# Patient Record
Sex: Female | Born: 1940
Health system: Southern US, Community
[De-identification: ages and names within clinical notes are randomized; demographics above are authoritative.]

## PROBLEM LIST (undated history)

## (undated) DIAGNOSIS — M1990 Unspecified osteoarthritis, unspecified site: Secondary | ICD-10-CM

## (undated) DIAGNOSIS — E7800 Pure hypercholesterolemia, unspecified: Secondary | ICD-10-CM

## (undated) DIAGNOSIS — R197 Diarrhea, unspecified: Secondary | ICD-10-CM

## (undated) DIAGNOSIS — M8580 Other specified disorders of bone density and structure, unspecified site: Secondary | ICD-10-CM

## (undated) DIAGNOSIS — I639 Cerebral infarction, unspecified: Secondary | ICD-10-CM

## (undated) HISTORY — PX: ABDOMINAL HYSTERECTOMY: SHX81

## (undated) HISTORY — PX: HERNIA REPAIR: SHX51

## (undated) HISTORY — PX: COLONOSCOPY: SHX174

## (undated) HISTORY — PX: CATARACT EXTRACTION: SUR2

---

## 2003-03-27 ENCOUNTER — Ambulatory Visit (HOSPITAL_COMMUNITY): Admission: RE | Admit: 2003-03-27 | Discharge: 2003-03-27 | Payer: Self-pay | Admitting: Family Medicine

## 2003-03-27 ENCOUNTER — Encounter: Payer: Self-pay | Admitting: Family Medicine

## 2003-05-14 ENCOUNTER — Ambulatory Visit (HOSPITAL_COMMUNITY): Admission: RE | Admit: 2003-05-14 | Discharge: 2003-05-14 | Payer: Self-pay | Admitting: Internal Medicine

## 2003-05-28 ENCOUNTER — Inpatient Hospital Stay (HOSPITAL_COMMUNITY): Admission: RE | Admit: 2003-05-28 | Discharge: 2003-05-30 | Payer: Self-pay | Admitting: Obstetrics & Gynecology

## 2005-11-09 ENCOUNTER — Ambulatory Visit (HOSPITAL_COMMUNITY): Admission: RE | Admit: 2005-11-09 | Discharge: 2005-11-09 | Payer: Self-pay | Admitting: Family Medicine

## 2005-11-10 ENCOUNTER — Emergency Department (HOSPITAL_COMMUNITY): Admission: EM | Admit: 2005-11-10 | Discharge: 2005-11-10 | Payer: Self-pay | Admitting: Emergency Medicine

## 2007-05-15 ENCOUNTER — Ambulatory Visit (HOSPITAL_COMMUNITY): Admission: RE | Admit: 2007-05-15 | Discharge: 2007-05-15 | Payer: Self-pay | Admitting: Family Medicine

## 2007-09-26 ENCOUNTER — Ambulatory Visit (HOSPITAL_COMMUNITY): Admission: RE | Admit: 2007-09-26 | Discharge: 2007-09-26 | Payer: Self-pay | Admitting: Family Medicine

## 2007-10-10 ENCOUNTER — Ambulatory Visit (HOSPITAL_COMMUNITY): Admission: RE | Admit: 2007-10-10 | Discharge: 2007-10-10 | Payer: Self-pay | Admitting: Family Medicine

## 2008-05-23 ENCOUNTER — Ambulatory Visit (HOSPITAL_COMMUNITY): Admission: RE | Admit: 2008-05-23 | Discharge: 2008-05-23 | Payer: Self-pay | Admitting: Family Medicine

## 2008-10-09 ENCOUNTER — Ambulatory Visit (HOSPITAL_COMMUNITY): Admission: RE | Admit: 2008-10-09 | Discharge: 2008-10-09 | Payer: Self-pay | Admitting: Family Medicine

## 2010-06-26 ENCOUNTER — Encounter: Admission: RE | Admit: 2010-06-26 | Discharge: 2010-06-26 | Payer: Self-pay | Admitting: General Surgery

## 2010-06-30 ENCOUNTER — Ambulatory Visit (HOSPITAL_BASED_OUTPATIENT_CLINIC_OR_DEPARTMENT_OTHER): Admission: RE | Admit: 2010-06-30 | Discharge: 2010-06-30 | Payer: Self-pay | Admitting: General Surgery

## 2010-08-17 ENCOUNTER — Ambulatory Visit (HOSPITAL_COMMUNITY)
Admission: RE | Admit: 2010-08-17 | Discharge: 2010-08-17 | Payer: Self-pay | Source: Home / Self Care | Admitting: Family Medicine

## 2010-12-03 LAB — BASIC METABOLIC PANEL
BUN: 10 mg/dL (ref 6–23)
CO2: 29 mEq/L (ref 19–32)
Calcium: 9.5 mg/dL (ref 8.4–10.5)
Chloride: 103 mEq/L (ref 96–112)
Creatinine, Ser: 0.81 mg/dL (ref 0.4–1.2)
GFR calc Af Amer: >60 mL/min (ref >60)
GFR calc non Af Amer: >60 mL/min (ref >60)
Glucose, Bld: 96 mg/dL (ref 70–99)
Potassium: 4.2 mEq/L (ref 3.5–5.1)
Sodium: 138 mEq/L (ref 135–145)

## 2010-12-03 LAB — CBC
HCT: 40.3 % (ref 36.0–46.0)
Hemoglobin: 13.6 g/dL (ref 12.0–15.0)
MCH: 30.8 pg (ref 26.0–34.0)
MCHC: 33.7 g/dL (ref 30.0–36.0)
MCV: 91.2 fL (ref 78.0–100.0)
Platelets: 223 10*3/uL (ref 150–400)
RBC: 4.42 MIL/uL (ref 3.87–5.11)
RDW: 13.4 % (ref 11.5–15.5)
WBC: 7 10*3/uL (ref 4.0–10.5)

## 2010-12-03 LAB — DIFFERENTIAL
Basophils Absolute: 0 10*3/uL (ref 0.0–0.1)
Basophils Relative: 0 % (ref 0–1)
Eosinophils Absolute: 0.1 10*3/uL (ref 0.0–0.7)
Eosinophils Relative: 1 % (ref 0–5)
Lymphocytes Relative: 26 % (ref 12–46)
Lymphs Abs: 1.8 10*3/uL (ref 0.7–4.0)
Monocytes Absolute: 0.5 10*3/uL (ref 0.1–1.0)
Monocytes Relative: 7 % (ref 3–12)
Neutro Abs: 4.6 10*3/uL (ref 1.7–7.7)
Neutrophils Relative %: 65 % (ref 43–77)

## 2010-12-03 LAB — POCT HEMOGLOBIN-HEMACUE: Hemoglobin: 13.2 g/dL (ref 12.0–15.0)

## 2010-12-08 ENCOUNTER — Other Ambulatory Visit (HOSPITAL_COMMUNITY): Payer: Self-pay | Admitting: Family Medicine

## 2010-12-08 DIAGNOSIS — M8580 Other specified disorders of bone density and structure, unspecified site: Secondary | ICD-10-CM

## 2010-12-14 ENCOUNTER — Ambulatory Visit (HOSPITAL_COMMUNITY)
Admission: RE | Admit: 2010-12-14 | Discharge: 2010-12-14 | Disposition: A | Discharge status: Home / Self Care | Payer: Medicare Other | Source: Ambulatory Visit | Attending: Family Medicine | Admitting: Family Medicine

## 2010-12-14 DIAGNOSIS — Z780 Asymptomatic menopausal state: Secondary | ICD-10-CM | POA: Insufficient documentation

## 2010-12-14 DIAGNOSIS — M8580 Other specified disorders of bone density and structure, unspecified site: Secondary | ICD-10-CM

## 2010-12-14 DIAGNOSIS — M899 Disorder of bone, unspecified: Secondary | ICD-10-CM | POA: Insufficient documentation

## 2010-12-14 DIAGNOSIS — M949 Disorder of cartilage, unspecified: Secondary | ICD-10-CM | POA: Insufficient documentation

## 2011-02-05 NOTE — Op Note (Signed)
NAME:  Christy Fletcher, Christy Fletcher                        ACCOUNT NO.:  [id number]   MEDICAL RECORD NO.:  [id number]                   PATIENT TYPE:  AMB   LOCATION:  DAY                                  FACILITY:  [facility]   PHYSICIAN:  Lazaro Arms, M.D.                DATE OF BIRTH:  [birth year]   DATE OF PROCEDURE:  05/28/2003  DATE OF DISCHARGE:                                 OPERATIVE REPORT   PREOPERATIVE DIAGNOSIS:  Symptomatic pelvic prolapse.   POSTOPERATIVE DIAGNOSIS:  Symptomatic pelvic prolapse.   PROCEDURE:  1. Total vaginal hysterectomy with bilateral salpingo-oophorectomy.  2. Anterior colporrhaphy using Pelvicol graft.  3. Vaginal vault suspension, interspinous.   SURGEON:  Lazaro Arms, M.D.   ANESTHESIA:  General endotracheal.   FINDINGS:  The patient was known to have a grade 3 uterine and grade 3  cystocele prolapse.  She also had prolapse of the vaginal apex.  She had  been becoming increasingly symptomatic since earlier this spring.  This was  confirmed at the time of surgery.  There were no other abnormalities seen.  The uterus, tubes, and ovaries were otherwise normal.   DESCRIPTION OF PROCEDURE:  The patient was taken to the operating room and  placed in the supine position where she underwent general endotracheal  anesthesia.  She was then placed in the dorsal lithotomy position and  prepped and draped in the usual sterile fashion.   A weighted speculum was placed.  The cervix was grasped and injected with  0.5% Marcaine with 1:200,000 epinephrine.  An incision was made about the  cervix with the electrocautery unit.  Because of the significant prolapse,  great care had to be taken in order to not injure the bladder and the  rectum.  She had, of course, concern with the significant cystocele, the  ureters coming down very low and looping back up, so great care was taken to  avoid injury of these structures.  The posterior cul-de-sac was then  entered.  The  uterosacral ligaments were clamped, cut, transfixion suture  ligated, and held.  The anterior peritoneum was then entered.  The anterior  and posterior leaves of the broad ligament were plicated, and the uterine  vessels were clamped, cut, and suture ligated.  Serial pedicles were taken  up the fundus, each pedicle being clamped, cut, and suture ligated.  The  utero-ovarian ligaments were crossclamped.  The uterus was removed.  Suture  ligation was performed.  The infundibulopelvic ligaments were then  crossclamped and double suture ligated, with good hemostasis.  The  peritoneum was then closed in a pursestring fashion, and the corners of the  vagina were closed anteriorly to posteriorly.  Again, there was good  hemostasis.  Marcaine 0.5% was then injected in the vesicovaginal space, and  the vagina was dissected off of the bladder without difficulty.  The  dissection was taken laterally to the  ischial spines and the sacrospinous  ligament bilaterally.  The Pelvicol graft was trimmed to size and then  anchored using the Cappio needle retriever system and an 0 Vicryl hub.  The  Pelvicol graft was then anchored to each side to the sacrospinous ligament.  The vaginal apex was then anchored to this sacrospinous fixation of the  Pelvicol graft bilaterally.  The superior portion of the graft was then  anchored to the pubocervical fascia.  Extra vaginal mucosa was trimmed away,  and the vagina was closed in the usual fashion.  The Pelvicol graft was  sitting nicely on the anterior vaginal wall, with good support.  There was  good hemostasis.  No packing was used.   The patient was awakened from anesthesia and taken to the recovery room in  good and stable condition.  All counts were correct.  She received a gram of  Ancef prophylactically, and all specimens were sent to the lab.                                               Lazaro Arms, M.D.    [name]  D:  05/28/2003  T:  05/28/2003   Job:  [id number]

## 2011-02-05 NOTE — Op Note (Signed)
   NAME:  ARAIYAH, CUMPTON                        ACCOUNT NO.:  [id number]   MEDICAL RECORD NO.:  [id number]                   PATIENT TYPE:  AMB   LOCATION:  DAY                                  FACILITY:  [facility]   PHYSICIAN:  Lionel December, M.D.                 DATE OF BIRTH:  11/27/1940   DATE OF PROCEDURE:  DATE OF DISCHARGE:                                 OPERATIVE REPORT   No dictation for this job.                                               Lionel December, M.D.    NR/MEDQ  D:  05/14/2003  T:  05/14/2003  Job:  [id number]

## 2011-02-05 NOTE — H&P (Signed)
NAME:  Christy Fletcher, Christy Fletcher                        ACCOUNT NO.:  [id number]   MEDICAL RECORD NO.:  [id number]                   PATIENT TYPE:  AMB   LOCATION:  DAY                                  FACILITY:  [facility]   PHYSICIAN:  Lazaro Arms, M.D.                DATE OF BIRTH:  Feb 16, 1941   DATE OF ADMISSION:  DATE OF DISCHARGE:                                HISTORY & PHYSICAL   HISTORY OF PRESENT ILLNESS:  Christy Fletcher is a 70 year old female, gravida 2,  para 2 who has been menopausal for approximately 20 years, who had an acute  change in pelvic pressure.  I saw her initially in June 2004.  At that time  she was found to have Grade 3 uterine prolapse, Grade 3 cystocele, no  rectocele and no demonstrable enterocele, and she denied any incontinence.  We talked about various options with the patient including conservative  management with Estrace, etc, and she has decided to have definitive  surgical management of TAH/BSO and anterior colporrhaphy.  No urodynamic  testing was performed because the patient was not incontinent; and, as a  result no sling will be placed.   PAST MEDICAL HISTORY:  Negative.   PAST SURGICAL HISTORY:  Negative.   PAST OBSTETRICAL HISTORY:  Two vaginal deliveries.   ALLERGIES:  None.   MEDICATIONS:  None.   REVIEW OF SYSTEMS:  Review of systems otherwise negative.   FAMILY HISTORY:  Family history significant for heart disease, hypertension,  cancer, diabetes, and thyroid disease.   SOCIAL HISTORY:  The patient does no smoke, drink or do drugs.  She works at  the [employer] in [place].  Denies any history of domestic abuse.   PHYSICAL EXAMINATION:  VITAL SIGNS:  Weight 143 pounds.  Blood pressure  120/80.  HEENT:  Unremarkable.  NECK:  Thyroid is normal.  LUNGS:  Clear.  HEART:  The heart shows a regular rate and rhythm without murmurs, rubs or  gallops.  BREASTS:  Deferred.  ABDOMEN:  Benign.  PELVIC:  Pelvic reveals a Grade 3 uterine and Grade 3  bladder prolapse.  She  has no rectocele and no enterocele.  EXTREMITIES:  Warm.  No edema.  NEUROLOGIC EXAMINATION:  Grossly intact.    IMPRESSION:  1. Symptomatic pelvic prolapse.   PLAN:  The patient is admitted for TAH/BSO, anterior colporrhaphy and  vaginal vault apex fixation.   The patient understands the risks of surgery including infection, bleeding  and damage to other organs.  She understands the graph.  She was given hand  outs of the graph and will proceed.                                                 Lazaro Arms, M.D.  LHE/MEDQ  D:  05/27/2003  T:  05/27/2003  Job:  [id number]

## 2011-02-05 NOTE — Op Note (Signed)
   NAME:  Christy Fletcher, Christy Fletcher                        ACCOUNT NO.:  [id number]   MEDICAL RECORD NO.:  [id number]                   PATIENT TYPE:  AMB   LOCATION:  DAY                                  FACILITY:  [facility]   PHYSICIAN:  Lionel December, M.D.                 DATE OF BIRTH:  1941-08-20   DATE OF PROCEDURE:  05/14/2003  DATE OF DISCHARGE:                                 OPERATIVE REPORT   PROCEDURE:  Total colonoscopy.   INDICATIONS:  [name] is a 70 year old Caucasian female who is undergoing  screening colonoscopy.  Family history is negative for colorectal carcinoma.  The procedure risks were reviewed with the patient.  Informed consent was  obtained.   PREMEDICATION:  Demerol 25 mg IV, Versed 4 mg IV.   FINDINGS:  Procedure performed in endoscopy suite.  The patient's vital  signs and O2 saturation were monitored during procedure and remained stable.  The patient was placed in the left lateral recumbent position and rectal  examination performed.  No abnormality noted on external or digital exam.  The scope was placed in the rectum and advanced under vision into the  sigmoid colon and beyond.  Preparation was excellent.  The scope was passed  into the cecum, which was identified by ileocecal valve and appendiceal  orifice.  Pictures taken for the record.  As the scope was withdrawn, the  colonic mucosa was once again carefully examined.  There were no polyps or  other abnormalities.  The rectal mucosa was normal.  The scope was  retroflexed to examine the anorectal junction, and moderate-sized  hemorrhoids were noted below the dentate line.  The endoscope was  straightened and withdrawn.  The patient tolerated the procedure well.   FINAL DIAGNOSIS:  Moderate-sized external hemorrhoids, otherwise normal  colonoscopy.   RECOMMENDATIONS:  She should continue yearly Hemoccults and consider having  a next screening exam in 10 years from now.                 Lionel December, M.D.    NR/MEDQ  D:  05/14/2003  T:  05/14/2003  Job:  [id number]   cc:   Mila Homer. Sudie Bailey, M.D.  [address] [place], [place] [address]  Fax: [phone]

## 2011-08-31 ENCOUNTER — Other Ambulatory Visit (HOSPITAL_COMMUNITY): Payer: Self-pay | Admitting: Family Medicine

## 2011-08-31 DIAGNOSIS — Z139 Encounter for screening, unspecified: Secondary | ICD-10-CM

## 2011-09-06 ENCOUNTER — Ambulatory Visit (HOSPITAL_COMMUNITY)
Admission: RE | Admit: 2011-09-06 | Discharge: 2011-09-06 | Disposition: A | Discharge status: Home / Self Care | Payer: Medicare Other | Source: Ambulatory Visit | Attending: Family Medicine | Admitting: Family Medicine

## 2011-09-06 DIAGNOSIS — Z1231 Encounter for screening mammogram for malignant neoplasm of breast: Secondary | ICD-10-CM | POA: Insufficient documentation

## 2011-09-06 DIAGNOSIS — Z139 Encounter for screening, unspecified: Secondary | ICD-10-CM

## 2012-06-26 ENCOUNTER — Other Ambulatory Visit (HOSPITAL_COMMUNITY): Payer: Self-pay | Admitting: Family Medicine

## 2012-06-26 DIAGNOSIS — M1990 Unspecified osteoarthritis, unspecified site: Secondary | ICD-10-CM

## 2012-06-30 ENCOUNTER — Other Ambulatory Visit (HOSPITAL_COMMUNITY): Payer: Medicare Other

## 2012-08-02 ENCOUNTER — Other Ambulatory Visit (HOSPITAL_COMMUNITY): Payer: Self-pay | Admitting: Family Medicine

## 2012-08-02 DIAGNOSIS — Z139 Encounter for screening, unspecified: Secondary | ICD-10-CM

## 2012-09-07 ENCOUNTER — Ambulatory Visit (HOSPITAL_COMMUNITY): Payer: Medicare Other

## 2012-09-14 ENCOUNTER — Ambulatory Visit (HOSPITAL_COMMUNITY)
Admission: RE | Admit: 2012-09-14 | Discharge: 2012-09-14 | Disposition: A | Discharge status: Home / Self Care | Payer: Medicare Other | Source: Ambulatory Visit | Attending: Family Medicine | Admitting: Family Medicine

## 2012-09-14 DIAGNOSIS — Z139 Encounter for screening, unspecified: Secondary | ICD-10-CM

## 2012-09-14 DIAGNOSIS — Z1231 Encounter for screening mammogram for malignant neoplasm of breast: Secondary | ICD-10-CM | POA: Insufficient documentation

## 2012-12-19 ENCOUNTER — Ambulatory Visit (HOSPITAL_COMMUNITY)
Admission: RE | Admit: 2012-12-19 | Discharge: 2012-12-19 | Disposition: A | Discharge status: Home / Self Care | Payer: Medicare Other | Source: Ambulatory Visit | Attending: Family Medicine | Admitting: Family Medicine

## 2012-12-19 DIAGNOSIS — Z780 Asymptomatic menopausal state: Secondary | ICD-10-CM | POA: Insufficient documentation

## 2012-12-19 DIAGNOSIS — M899 Disorder of bone, unspecified: Secondary | ICD-10-CM | POA: Insufficient documentation

## 2012-12-19 DIAGNOSIS — M1990 Unspecified osteoarthritis, unspecified site: Secondary | ICD-10-CM

## 2013-04-12 ENCOUNTER — Telehealth: Payer: Self-pay

## 2013-04-12 NOTE — Telephone Encounter (Signed)
Pt received letter from [facility] that it was time to repeat tcs and to call Triage nurse. Please return patient's call at [phone]

## 2013-04-13 NOTE — Telephone Encounter (Signed)
Called and left message for pt to return call to be scheduled for TCS. Last one was 05/14/2003.

## 2013-04-17 NOTE — Telephone Encounter (Signed)
LM for pt to call

## 2013-04-18 ENCOUNTER — Other Ambulatory Visit: Payer: Self-pay

## 2013-04-18 DIAGNOSIS — Z1211 Encounter for screening for malignant neoplasm of colon: Secondary | ICD-10-CM

## 2013-04-20 ENCOUNTER — Telehealth: Payer: Self-pay

## 2013-04-20 NOTE — Telephone Encounter (Signed)
PREPOPIK-DRINK WATER TO KEEP URINE LIGHT YELLOW.  PT SHOULD DROP OFF RX 3 DAYS PRIOR TO PROCEDURE.  

## 2013-04-20 NOTE — Telephone Encounter (Signed)
PLEASE CALL PT.  We do not have a Mg Citrate prep.

## 2013-04-20 NOTE — Telephone Encounter (Signed)
See separate triage.  

## 2013-04-20 NOTE — Telephone Encounter (Signed)
PT REQUESTS MAG CITRATE    SAID THAT IS WHAT SHE HAD TO DRINK LAST TIME

## 2013-04-20 NOTE — Telephone Encounter (Signed)
Gastroenterology Pre-Procedure Review    Request Date: 04/19/2013  Requesting Physician: Pt received a letter from out office that it was time to repeat   LAST ONE WAS DONE 05/14/2003 BY NUR  PATIENT REVIEW QUESTIONS: The patient responded to the following health history questions as indicated:    1. Diabetes Melitis: no 2. Joint replacements in the past 12 months: no 3. Major health problems in the past 3 months: no 4. Has an artificial valve or MVP: no 5. Has a defibrillator: no 6. Has been advised in past to take antibiotics in advance of a procedure like teeth cleaning: no    MEDICATIONS & ALLERGIES:    Patient reports the following regarding taking any blood thinners:   Plavix? no Aspirin? no Coumadin? no  Patient confirms/reports the following medications:  Current Outpatient Prescriptions  Medication Sig Dispense Refill  . alendronate (FOSAMAX) 70 MG tablet Take 70 mg by mouth every 7 (seven) days. Take with a full glass of water on an empty stomach.      . lovastatin (MEVACOR) 20 MG tablet Take 20 mg by mouth at bedtime.       No current facility-administered medications for this visit.    Patient confirms/reports the following allergies:  No Known Allergies  No orders of the defined types were placed in this encounter.    AUTHORIZATION INFORMATION Primary Insurance:   ID #:   Group #:  Pre-Cert / Auth required: Pre-Cert / Auth #:   Secondary Insurance:   ID #: Group #:  Pre-Cert / Auth required:  Pre-Cert / Auth #:   SCHEDULE INFORMATION: Procedure has been scheduled as follows:  Date:05/07/2013              Time:  10:30 am Location: [facility] Short Stay  This Gastroenterology Pre-Precedure Review Form is being routed to the following provider(s): Jonette Eva, MD

## 2013-04-23 ENCOUNTER — Telehealth: Payer: Self-pay

## 2013-04-23 MED ORDER — SOD PICOSULFATE-MAG OX-CIT ACD 10-3.5-12 MG-GM-GM PO PACK: 1.0000 | PACK | Freq: Once | ORAL | Status: DC

## 2013-04-23 NOTE — Addendum Note (Signed)
Addended by: Lavena Bullion on: 04/23/2013 08:31 AM   Modules accepted: Orders

## 2013-04-23 NOTE — Telephone Encounter (Signed)
Rx sent to the pharmacy and instructions mailed to pt.  

## 2013-04-23 NOTE — Telephone Encounter (Signed)
Rx printed the first time, had to put in again to send electronically.

## 2013-04-23 NOTE — Telephone Encounter (Signed)
I tried to call pt to let her know about prep. ( No mag citrate prep). LMOM for a return call.

## 2013-04-23 NOTE — Telephone Encounter (Signed)
I faxed request for colonoscopy to [facility] and they faxed info: AUTHORIZATION # [id number]    Effective date  05/07/2013 to 06/07/2013. Will have this also scanned in.

## 2013-05-01 ENCOUNTER — Encounter (HOSPITAL_COMMUNITY): Payer: Self-pay | Admitting: Pharmacy Technician

## 2013-05-14 ENCOUNTER — Ambulatory Visit (HOSPITAL_COMMUNITY)
Admission: RE | Admit: 2013-05-14 | Discharge: 2013-05-14 | Disposition: A | Discharge status: Home / Self Care | Payer: Medicare Other | Source: Ambulatory Visit | Attending: Gastroenterology | Admitting: Gastroenterology

## 2013-05-14 ENCOUNTER — Encounter (HOSPITAL_COMMUNITY): Payer: Self-pay | Admitting: *Deleted

## 2013-05-14 ENCOUNTER — Encounter (HOSPITAL_COMMUNITY)
Admission: RE | Disposition: A | Discharge status: Home / Self Care | Payer: Self-pay | Source: Ambulatory Visit | Attending: Gastroenterology

## 2013-05-14 DIAGNOSIS — M129 Arthropathy, unspecified: Secondary | ICD-10-CM | POA: Insufficient documentation

## 2013-05-14 DIAGNOSIS — Z79899 Other long term (current) drug therapy: Secondary | ICD-10-CM | POA: Insufficient documentation

## 2013-05-14 DIAGNOSIS — E7800 Pure hypercholesterolemia, unspecified: Secondary | ICD-10-CM | POA: Insufficient documentation

## 2013-05-14 DIAGNOSIS — K648 Other hemorrhoids: Secondary | ICD-10-CM

## 2013-05-14 DIAGNOSIS — Z1211 Encounter for screening for malignant neoplasm of colon: Secondary | ICD-10-CM

## 2013-05-14 DIAGNOSIS — M899 Disorder of bone, unspecified: Secondary | ICD-10-CM | POA: Insufficient documentation

## 2013-05-14 DIAGNOSIS — R197 Diarrhea, unspecified: Secondary | ICD-10-CM | POA: Insufficient documentation

## 2013-05-14 HISTORY — DX: Diarrhea, unspecified: R19.7

## 2013-05-14 HISTORY — DX: Other specified disorders of bone density and structure, unspecified site: M85.80

## 2013-05-14 HISTORY — DX: Unspecified osteoarthritis, unspecified site: M19.90

## 2013-05-14 HISTORY — DX: Pure hypercholesterolemia, unspecified: E78.00

## 2013-05-14 HISTORY — PX: COLONOSCOPY: SHX5424

## 2013-05-14 SURGERY — COLONOSCOPY
Anesthesia: Moderate Sedation

## 2013-05-14 MED ORDER — MEPERIDINE HCL 100 MG/ML IJ SOLN
INTRAMUSCULAR | Status: DC | PRN
  Administered 2013-05-14: 25 mg via INTRAVENOUS

## 2013-05-14 MED ORDER — MIDAZOLAM HCL 5 MG/5ML IJ SOLN
INTRAMUSCULAR | Status: AC
  Filled 2013-05-14: qty 10

## 2013-05-14 MED ORDER — SODIUM CHLORIDE 0.9 % IV SOLN
INTRAVENOUS | Status: DC
  Administered 2013-05-14: 08:00:00 via INTRAVENOUS

## 2013-05-14 MED ORDER — STERILE WATER FOR IRRIGATION IR SOLN
Status: DC | PRN
  Administered 2013-05-14: 09:00:00

## 2013-05-14 MED ORDER — MEPERIDINE HCL 100 MG/ML IJ SOLN
INTRAMUSCULAR | Status: AC
  Filled 2013-05-14: qty 2

## 2013-05-14 MED ORDER — MIDAZOLAM HCL 5 MG/5ML IJ SOLN
INTRAMUSCULAR | Status: DC | PRN
  Administered 2013-05-14: 2 mg via INTRAVENOUS
  Administered 2013-05-14: 1 mg via INTRAVENOUS

## 2013-05-14 NOTE — H&P (Signed)
  Primary Care Physician:  Milana Obey, MD Primary Gastroenterologist:  Dr. Darrick Penna  Pre-Procedure History & Physical: HPI:  Christy Fletcher is a 72 y.o. female here for COLON CANCER SCREENING.  Past Medical History  Diagnosis Date  . Hypercholesteremia   . Arthritis   . Osteopenia   . Diarrhea     Past Surgical History  Procedure Laterality Date  . Abdominal hysterectomy    . Cataract extraction    . Colonoscopy      Prior to Admission medications   Medication Sig Start Date End Date Taking? Authorizing Provider  alendronate (FOSAMAX) 70 MG tablet Take 70 mg by mouth every 7 (seven) days. Take with a full glass of water on an empty stomach.   Yes Historical Provider, MD  lovastatin (MEVACOR) 20 MG tablet Take 20 mg by mouth at bedtime.   Yes Historical Provider, MD  Sod Picosulfate-Mag Ox-Cit Acd 10-3.5-12 MG-GM-GM PACK Take 1 kit by mouth once. 04/23/13  Yes West Bali, MD  calcium carbonate (OS-CAL) 600 MG TABS tablet Take 1,200 mg by mouth daily.    Historical Provider, MD    Allergies as of 04/18/2013  . (Not on File)    Family History  Problem Relation Age of Onset  . Colon cancer Neg Hx     History   Social History  . Marital Status: Married    Spouse Name: N/A    Number of Children: N/A  . Years of Education: N/A   Occupational History  . Not on file.   Social History Main Topics  . Smoking status: Never Smoker   . Smokeless tobacco: Not on file  . Alcohol Use: No  . Drug Use: No  . Sexual Activity: Not on file   Other Topics Concern  . Not on file   Social History Narrative  . No narrative on file    Review of Systems: See HPI, otherwise negative ROS   Physical Exam: BP 136/84  Pulse 67  Temp(Src) 98.1 F (36.7 C) (Oral)  Resp 14  Ht 5' 1.5" (1.562 m)  Wt 137 lb (62.143 kg)  BMI 25.47 kg/m2  SpO2 96% General:   Alert,  pleasant and cooperative in NAD Head:  Normocephalic and atraumatic. Neck:  Supple; Lungs:  Clear  throughout to auscultation.    Heart:  Regular rate and rhythm. Abdomen:  Soft, nontender and nondistended. Normal bowel sounds, without guarding, and without rebound.   Neurologic:  Alert and  oriented x4;  grossly normal neurologically.  Impression/Plan:     SCREENING  Plan:  1. TCS TODAY

## 2013-05-14 NOTE — Op Note (Signed)
[facility] [address] [place], [address]   COLONOSCOPY PROCEDURE REPORT  PATIENT: Christy Fletcher, Christy Fletcher  MR#: [id number] BIRTHDATE: 1941-04-12 , 72  yrs. old GENDER: Female ENDOSCOPIST: Jonette Eva, MD REFERRED XB:JYNWG Sudie Bailey, M.D. PROCEDURE DATE:  05/14/2013 PROCEDURE:   Colonoscopy, screening INDICATIONS:Average risk patient for colon cancer. MEDICATIONS: Demerol 25 mg IV and Versed 3 mg IV  DESCRIPTION OF PROCEDURE:    Physical exam was performed.  Informed consent was obtained from the patient after explaining the benefits, risks, and alternatives to procedure.  The patient was connected to monitor and placed in left lateral position. Continuous oxygen was provided by nasal cannula and IV medicine administered through an indwelling cannula.  After administration of sedation and rectal exam, the patients rectum was intubated and the EC-[id number] ([id number])  colonoscope was advanced under direct visualization to the ileum.  The scope was removed slowly by carefully examining the color, texture, anatomy, and integrity mucosa on the way out.  The patient was recovered in endoscopy and discharged home in satisfactory condition.    COLON FINDINGS: The mucosa appeared normal in the terminal ileum.  , A normal appearing cecum, ileocecal valve, and appendiceal orifice were identified.  The ascending, hepatic flexure, transverse, splenic flexure, descending, sigmoid colon and rectum appeared unremarkable.  No polyps or cancers were seen.  , and Small internal hemorrhoids were found.  PREP QUALITY: excellent. CECAL W/D TIME: 9 minutes COMPLICATIONS: None  ENDOSCOPIC IMPRESSION: 1.   Normal mucosa in the terminal ileum 2.   Normal colon 3.   Small internal hemorrhoids  RECOMMENDATIONS: TO AVOID loose stools, AVOID DAIRY OR USE LACTASE PILLS 2 OR 3 WITH MEALS CONTAINING DAIRY. FOLLOW A HIGH FIBER DIET.  AVOID ITEMS THAT CAUSE BLOATING. USE PREPARARTION H 2 TO  4 TIMES A DAY AS NEEDED FOR RECTAL PRESSURE/PAIN/BLEEDING.  Next colonoscopy in 10-15 years IF THE BENEFITS OUTWEIGH THE RISKS.       _______________________________ Rosalie DoctorJonette Eva, MD 05/14/2013 12:51 PM

## 2013-05-16 ENCOUNTER — Encounter (HOSPITAL_COMMUNITY): Payer: Self-pay | Admitting: Gastroenterology

## 2013-10-06 ENCOUNTER — Emergency Department (HOSPITAL_COMMUNITY)
Admission: EM | Admit: 2013-10-06 | Discharge: 2013-10-06 | Disposition: A | Discharge status: Home / Self Care | Payer: Worker's Compensation | Attending: Emergency Medicine | Admitting: Emergency Medicine

## 2013-10-06 ENCOUNTER — Encounter (HOSPITAL_COMMUNITY): Payer: Self-pay | Admitting: Emergency Medicine

## 2013-10-06 ENCOUNTER — Emergency Department (HOSPITAL_COMMUNITY): Payer: Worker's Compensation

## 2013-10-06 DIAGNOSIS — E7800 Pure hypercholesterolemia, unspecified: Secondary | ICD-10-CM | POA: Insufficient documentation

## 2013-10-06 DIAGNOSIS — W010XXA Fall on same level from slipping, tripping and stumbling without subsequent striking against object, initial encounter: Secondary | ICD-10-CM | POA: Insufficient documentation

## 2013-10-06 DIAGNOSIS — Z79899 Other long term (current) drug therapy: Secondary | ICD-10-CM | POA: Insufficient documentation

## 2013-10-06 DIAGNOSIS — R197 Diarrhea, unspecified: Secondary | ICD-10-CM | POA: Insufficient documentation

## 2013-10-06 DIAGNOSIS — Y9289 Other specified places as the place of occurrence of the external cause: Secondary | ICD-10-CM | POA: Insufficient documentation

## 2013-10-06 DIAGNOSIS — Y9389 Activity, other specified: Secondary | ICD-10-CM | POA: Insufficient documentation

## 2013-10-06 DIAGNOSIS — Y990 Civilian activity done for income or pay: Secondary | ICD-10-CM | POA: Insufficient documentation

## 2013-10-06 DIAGNOSIS — S52123A Displaced fracture of head of unspecified radius, initial encounter for closed fracture: Secondary | ICD-10-CM | POA: Insufficient documentation

## 2013-10-06 DIAGNOSIS — Z8739 Personal history of other diseases of the musculoskeletal system and connective tissue: Secondary | ICD-10-CM | POA: Insufficient documentation

## 2013-10-06 NOTE — ED Provider Notes (Signed)
Medical screening examination/treatment/procedure(s) were performed by non-physician practitioner and as supervising physician I was immediately available for consultation/collaboration.  EKG Interpretation   None        Kaiel Weide, MD 10/06/13 2334 

## 2013-10-06 NOTE — ED Provider Notes (Signed)
CSN: [id number]     Arrival date & time 10/06/13  1831 History   First MD Initiated Contact with Patient 10/06/13 1902     Chief Complaint  Patient presents with  . Arm Pain   (Consider location/radiation/quality/duration/timing/severity/associated sxs/prior Treatment) HPI Comments: And patient is a 73 year old female who presents to the emergency department with complaint of arm/elbow pain. The patient states that she was preparing to leave her job when she tripped over a box of batteries that was left in the floor at her workplace. She fell on an outstretched hand. She noticed severe pain of the elbow area, and presents to the emergency department for evaluation and management. The patient denies being on any blood thinning type medications. She's not had any previous operations or procedures involving the right upper extremity.  Patient is a 73 y.o. female presenting with arm pain. The history is provided by the patient.  Arm Pain This is a new problem. Associated symptoms include arthralgias. Pertinent negatives include no abdominal pain, chest pain, coughing or neck pain.    Past Medical History  Diagnosis Date  . Hypercholesteremia   . Arthritis   . Osteopenia   . Diarrhea    Past Surgical History  Procedure Laterality Date  . Abdominal hysterectomy    . Cataract extraction    . Colonoscopy    . Colonoscopy N/A 05/14/2013    Procedure: COLONOSCOPY;  Surgeon: West Bali, MD;  Location: [facility];  Service: Endoscopy;  Laterality: N/A;  10:30 AM-rescheduled to 8:45am Doris notified pt   Family History  Problem Relation Age of Onset  . Colon cancer Neg Hx    History  Substance Use Topics  . Smoking status: Never Smoker   . Smokeless tobacco: Not on file  . Alcohol Use: No   OB History   Grav Para Term Preterm Abortions TAB SAB Ect Mult Living                 Review of Systems  Constitutional: Negative for activity change.       All ROS Neg except as noted in  HPI  HENT: Negative for nosebleeds.   Eyes: Negative for photophobia and discharge.  Respiratory: Negative for cough, shortness of breath and wheezing.   Cardiovascular: Negative for chest pain and palpitations.  Gastrointestinal: Positive for diarrhea. Negative for abdominal pain and blood in stool.  Genitourinary: Negative for dysuria, frequency and hematuria.  Musculoskeletal: Positive for arthralgias. Negative for back pain and neck pain.  Skin: Negative.   Neurological: Negative for dizziness, seizures and speech difficulty.  Psychiatric/Behavioral: Negative for hallucinations and confusion.    Allergies  Review of patient's allergies indicates no known allergies.  Home Medications   Current Outpatient Rx  Name  Route  Sig  Dispense  Refill  . calcium carbonate (OS-CAL) 600 MG TABS tablet   Oral   Take 1,200 mg by mouth daily.         Marland Kitchen lovastatin (MEVACOR) 20 MG tablet   Oral   Take 20 mg by mouth at bedtime.         Marland Kitchen alendronate (FOSAMAX) 70 MG tablet   Oral   Take 70 mg by mouth every 7 (seven) days. Take with a full glass of water on an empty stomach.(Wednesday)          BP 167/91  Pulse 77  Temp(Src) 98.6 F (37 C) (Oral)  Resp 20  Ht 5' 1.5" (1.562 m)  Wt 137 lb (  62.143 kg)  BMI 25.47 kg/m2  SpO2 98% Physical Exam  Nursing note and vitals reviewed. Constitutional: She is oriented to person, place, and time. She appears well-developed and well-nourished.  Non-toxic appearance.  HENT:  Head: Normocephalic.  Right Ear: Tympanic membrane and external ear normal.  Left Ear: Tympanic membrane and external ear normal.  Eyes: EOM and lids are normal. Pupils are equal, round, and reactive to light.  Neck: Normal range of motion. Neck supple. Carotid bruit is not present.  Cardiovascular: Normal rate, regular rhythm, normal heart sounds, intact distal pulses and normal pulses.   Pulmonary/Chest: Breath sounds normal. No respiratory distress.  Abdominal:  Soft. Bowel sounds are normal. There is no tenderness. There is no guarding.  Musculoskeletal: Normal range of motion.  Is full range of motion of the right shoulder. There's no deformity. There is no evidence of dislocation. There is pain and mild swelling of the elbow area. There is pain with flexion extension and with pronation. Is full range of motion of the wrist of the right hand. There is good range of motion of the fingers. There degenerative joint disease changes of the fingers. Capillary refill is less than 3 seconds.  Lymphadenopathy:       Head (right side): No submandibular adenopathy present.       Head (left side): No submandibular adenopathy present.    She has no cervical adenopathy.  Neurological: She is alert and oriented to person, place, and time. She has normal strength. No cranial nerve deficit or sensory deficit.  Skin: Skin is warm and dry.  Psychiatric: She has a normal mood and affect. Her speech is normal.    ED Course  Procedures (including critical care time) Labs Review Labs Reviewed - No data to display Imaging Review Dg Elbow Complete Right  10/06/2013   CLINICAL DATA:  Elbow and proximal forearm pain. History of recent fall.  EXAM: RIGHT ELBOW - COMPLETE 3+ VIEW  COMPARISON:  No priors.  FINDINGS: A large elbow joint effusion is noted. Displaced fracture of the lateral aspect of the radial head with approximately 3 mm of distal migration, and probable extension to the articular surface. No other acute displaced fractures are noted. No dislocation.  IMPRESSION: 1. Acute minimally displaced intra-articular fracture of the radial head with large joint effusion.   Electronically Signed   By: Trudie Reedaniel  Entrikin M.D.   On: 10/06/2013 19:13   Dg Forearm Right  10/06/2013   CLINICAL DATA:  History of trauma from a fall. Elbow and forearm pain.  EXAM: RIGHT FOREARM - 2 VIEW  COMPARISON:  No priors.  FINDINGS: Previously described minimally displaced intra-articular  fracture of the radial head is again noted. No other acute displaced fractures of the radius or ulna are noted.  IMPRESSION: 1. Acute mildly displaced intra-articular fracture of the radial head.   Electronically Signed   By: Trudie Reedaniel  Entrikin M.D.   On: 10/06/2013 19:14    EKG Interpretation   None       MDM  No diagnosis found. *I have reviewed nursing notes, vital signs, and all appropriate lab and imaging results for this patient.** Xray is consistent with acute minimally displaced intra-articular fracture of the radial head. Pt seen with me by Dr Juleen ChinaKohut.  Pt is fitted with posterior splint and sling.  Pt to see Dr Eulah PontMurphy (she has seen him before) for evaluation and management.  Kathie DikeHobson M Jaycee Mckellips, PA-C 10/06/13 1940

## 2013-10-06 NOTE — Discharge Instructions (Signed)
Your x-ray reveals a fracture of one of the bones in the elbow area. (Radial head fracture) please apply ice for 10-15 minute intervals. Please see Dr. Eulah PontMurphy for evaluation and management of this fracture as sone as possible. Please return to the emergency department or see Dr. Eulah PontMurphy immediately if any changes in color or circulation of your hand. Numbness or tingling. Unusual swelling of the arm and hand. Changes or concerns. Radial Head Fracture A radial head fracture is a break of the smaller bone (radius) in the forearm. The head of this bone is the part near the elbow. These fractures commonly happen during a fall when you land on the outstretched arm. These fractures are more common in middle aged adults and are common with a dislocation of the elbow. SYMPTOMS   Swelling of the elbow joint and pain on the outside of the elbow.  Pain and difficulty in bending or straightening the elbow.  Pain and difficulty in turning the palm of the hand up or down with the elbow bent. DIAGNOSIS  Your caregiver may make this diagnosis by a physical exam. X-rays can confirm the type and amount of break. Sometimes a break which is not displaced cannot be seen on the original x-ray. TREATMENT  Radial head fractures are classified according to the amount of movement (displacement) of parts from the normal position.  Type 1 Fractures  Type 1 fractures are generally small fractures in which bone pieces remain together (non-displaced fracture).  The fracture may not be seen on initial X-rays. Usually if x-rays are repeated two to three weeks later, the fracture will show up. A splint or sling is used for a few days. Gentle early motion is used to prevent the elbow from becoming stiff. It should not be done vigorously or forced as this could displace the bone pieces. Type 2 Fractures  With type 2 fractures, bone pieces are slightly displaced and larger pieces of bone are broken off.  If only a little  displacement of the bone piece is present, splinting for 4 to 5 days usually works well. This is again followed with gentle active range of motion. Small fragments may be surgically removed.  Large pieces of bone that can be put back into place will sometimes be fixed with pins or screws to hold them until the bone is healed. If this cannot be done, the fragments are removed. For older, less active people, sometimes the entire radial head is removed if the wrist is not injured. The elbow and arm will still work fine. Soft tissue, tendon, and ligament injuries are corrected at the same time. Type 3 Fractures  Type 3 fractures have multiple broken pieces of bone which cannot be fixed. Surgery is usually needed to remove the broken bits of bone and what is left of the radial head. Soft-tissue damage is repaired. Gentle early motion is used to prevent the elbow from becoming stiff. Sometimes an artificial radial head can be used to prevent deformity if elbow is instable. Rest, ice, elevation, immobilization, medications, and pain control are used in the early care. HOME CARE INSTRUCTIONS   Keep the injured part elevated while sitting or lying down. Keep the injury above the level of your heart (the center of the chest). This will decrease swelling and pain.  Apply ice to the injury for 15-20 minutes, 03-04 times per day while awake, for 2 days. Put the ice in a plastic bag and place a towel between the bag of ice and  your cast or splint.  Move your fingers to avoid stiffness and minimize swelling.  If you have a plaster or fiberglass cast:  Do not try to scratch the skin under the cast using sharp or pointed objects.  Check the skin around the cast every day. You may put lotion on any red or sore areas.  Keep your cast dry and clean.  If you have a plaster splint:  Wear the splint as directed.  You may loosen the elastic around the splint if your fingers become numb, tingle, or turn cold or  blue.  Do not put pressure on any part of your cast or splint. It may break. Rest your cast only on a pillow for the first 24 hours until it is fully hardened.  Your cast or splint can be protected during bathing with a plastic bag. Do not lower the cast or splint into water.  Only take over-the-counter or prescription medicines for pain, discomfort, or fever as directed by your caregiver.  Follow all instructions for follow up with your caregiver. This includes any orthopedic referrals, physical therapy and rehabilitation. Any delay in obtaining necessary care could result in a delay or failure of the bones to heal or permanent elbow stiffness.  Do not over do exercises. This could further damage your injury. SEEK IMMEDIATE MEDICAL CARE IF:   Your cast or splint gets damaged or breaks.  You have more severe pain or swelling than you did before getting the cast.  You have severe pain when stretching your fingers.  There is a bad smell, new stains and/or pus-like (purulent) drainage coming from under the cast.  Your fingers or hand turn pale or blue, become cold, or you lose feeling. Document Released: 06/28/2006 Document Revised: 11/29/2011 Document Reviewed: 08/05/2009 [facility] Patient Information 2014 [place], [place].

## 2013-10-06 NOTE — ED Notes (Signed)
Larey SeatFell over a couple of boxes at work today, pain in right elbow and forearm area

## 2013-11-19 ENCOUNTER — Ambulatory Visit (HOSPITAL_COMMUNITY)
Admission: RE | Admit: 2013-11-19 | Discharge: 2013-11-19 | Disposition: A | Discharge status: Home / Self Care | Payer: Worker's Compensation | Source: Ambulatory Visit | Attending: Orthopedic Surgery | Admitting: Orthopedic Surgery

## 2013-11-19 DIAGNOSIS — M25429 Effusion, unspecified elbow: Secondary | ICD-10-CM | POA: Insufficient documentation

## 2013-11-19 DIAGNOSIS — M25539 Pain in unspecified wrist: Secondary | ICD-10-CM | POA: Insufficient documentation

## 2013-11-19 DIAGNOSIS — R609 Edema, unspecified: Secondary | ICD-10-CM | POA: Insufficient documentation

## 2013-11-19 DIAGNOSIS — M25621 Stiffness of right elbow, not elsewhere classified: Secondary | ICD-10-CM | POA: Insufficient documentation

## 2013-11-19 DIAGNOSIS — M6281 Muscle weakness (generalized): Secondary | ICD-10-CM | POA: Insufficient documentation

## 2013-11-19 DIAGNOSIS — M25629 Stiffness of unspecified elbow, not elsewhere classified: Secondary | ICD-10-CM | POA: Insufficient documentation

## 2013-11-19 DIAGNOSIS — M25529 Pain in unspecified elbow: Secondary | ICD-10-CM | POA: Insufficient documentation

## 2013-11-19 DIAGNOSIS — IMO0001 Reserved for inherently not codable concepts without codable children: Secondary | ICD-10-CM | POA: Insufficient documentation

## 2013-11-19 NOTE — Evaluation (Signed)
Occupational Therapy Evaluation  Patient Details  Name: Christy Fletcher MRN: [id number] Date of Birth: Jan 27, 1941  Today's Date: 11/19/2013 Time: 4098-1191 OT Time Calculation (min): 42 min Eval 42'  Visit#: 1 of 10  Re-eval: 12/17/13  Assessment Diagnosis: right Elbow radial head fracture Surgical Date:  (n/a) Next MD Visit: March 19th  Authorization: Worker's Comp - [place]  Authorization Time Period:    Authorization Visit#: 1 of     Past Medical History:  Past Medical History  Diagnosis Date  . Hypercholesteremia   . Arthritis   . Osteopenia   . Diarrhea    Past Surgical History:  Past Surgical History  Procedure Laterality Date  . Abdominal hysterectomy    . Cataract extraction    . Colonoscopy    . 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 She was going to the lockers at work and misjedged where recently delivered boxes on the floor were placed - this is her only fall.  Christy Fletcher was placed in a splint and sling at [facility] on that Saturday, and on the following thursday MD Thurston Hole removed the spint and pt continued to wear the sling. Pt received x-rays at 2 week intervals (2x), and was d/c-ed from her sling this past Thursday (2/26).  Pt previously had a lifting restricion, but all restrictions have been lifted. Pt has been completing exercises prescribed my MD. Special Tests: FOTO 41/100 Patient Stated Goals: "Get my arm straight and get back to work." Pain Assessment Currently in Pain?: Yes Pain Score: 2  Pain Location: Elbow Pain Orientation:  Right Pain Type: Acute pain  Precautions/Restrictions  Precautions Precautions: None Restrictions Weight Bearing Restrictions: No  Balance Screening Balance Screen Has the patient fallen in the past 6 months: Yes How many times?: 1 Has the patient had a decrease in activity level because of a fear of falling? : No Is the patient reluctant to leave their home because of a fear of falling? 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 she has slight pain with putting on earrings. Toilet hygiene was difficult at first, but pt can now complete with no difficulties. Dominant Hand: Right Vision - History Baseline Vision: Wears glasses all the time Vision - Assessment Eye Alignment: Within Functional Limits  Cognition/Observation Cognition Overall Cognitive Status: Within Functional Limits for tasks assessed Arousal/Alertness: Awake/alert Orientation Level: Oriented X4 Observation/Other Assessments Observations: Pt was not wearing sling at eval (d/c-ed on previous thursday)  Sensation/Coordination/Edema Sensation Light Touch:  Appears Intact Coordination Gross Motor Movements are Fluid and Coordinated: Yes Fine Motor Movements are Fluid and Coordinated: Yes 9 Hole Peg  Test: Right 20.12 seconds; Left 22.77 seconds Edema Edema: Pt has moderate edema on radial edge of right elbow  Additional Assessments RUE AROM (degrees) RUE Overall AROM Comments: Assess in sitting Right Shoulder Flexion: 145 Degrees Right Shoulder ABduction:  (WNL) Right Elbow Flexion: 113 Right Elbow Extension: -27 Right Forearm Pronation:  (WNL) Right Forearm Supination:  (WNL) Right Wrist Extension:  (WNL) Right Wrist Flexion:  (WNL) Right Wrist Radial Deviation:  (WNL) Right Wrist Ulnar Deviation:  (WNL) RUE PROM (degrees) RUE Overall PROM Comments: Asessed in sitting Right Elbow Flexion: 132 Right Elbow Extension: -27 RUE Strength RUE Overall Strength Comments: Assessed in sitting Right Elbow Flexion: 3-/5 Right Elbow Extension: 3-/5 Grip (lbs): 17 Lateral Pinch: 9 lbs 3 Point Pinch: 8 lbs LUE Strength LUE Overall Strength Comments: Assess in sitting Grip (lbs): 15 Lateral Pinch: 7 lbs 3 Point Pinch: 8 lbs Palpation Palpation: Pt has mod edema on radial edge of right elbow. Pt has mod-max fascial restricions in right bicep and tricep regions and min fascial resetrictions in right forearm. 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 Pt will benefit from skilled therapeutic intervention in order to improve on the following deficits: Decreased range of motion;Decreased strength;Increased edema;Increased fascial restricitons;Impaired UE functional use;Pain Rehab Potential: Good OT Frequency: Min 2X/week (3x per week initially, and  decrease to 2x per week) OT Duration: 4 weeks OT Treatment/Interventions: Self-care/ADL training;Therapeutic exercise;Manual therapy;Modalities;Therapeutic activities;Patient/family education OT Plan: P: Pt will benefit from skilled OT services to increase ROM , decrease restrictions, improve strength, and improve  the functional use of her RUE. Treatment Plan: PROM, AAROM, AROM, MFR, strengthening, HEP   Goals Home Exercise Program Pt/caregiver will Perform Home Exercise Program: For increased ROM;For increased strengthening PT Goal: Perform Home Exercise Program - Progress: Goal set today Short Term Goals Time to Complete Short Term Goals: 2 weeks Short Term Goal 1: Pt will be educated on HEP Short Term Goal 2: Pt will attain PROM WFL in right elbow to work towards improved ADL function Short Term Goal 3: Pt will have strength of at least 3+/5 in right elbow to work towards improved lifting abilites at work Short Term Goal 4: Pt will have min edema in right elbow region Short Term Goal 5: Pt will have min fascial restrictions in bicep and tricep regions Long Term Goals Time to Complete Long Term Goals: 4 weeks Long Term Goal 1: Pt will gain highest level of function using RUE in all ADL, IADL, work, and leisure activities. Long Term Goal 2: Pt will attain AROM WNL in right elbow in order to wash the back of her neck with her right hand Long Term Goal 3: Pt will have strength of atleast 4+/5 in right elbow for improved ability to pick up milk jugs at work Long Term Goal 4: Pt will have traced edema and fascial restricions in right upper extremity Long Term Goal 5: Pt will report pain levels of  less than 1/10 in 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  Problem List Patient Active Problem List   Diagnosis Date Noted  . Decreased range of motion of right elbow 11/19/2013  . Muscle weakness (generalized) 11/19/2013  . Edema 11/19/2013  . Pain in joint, forearm 11/19/2013    End  of Session Activity Tolerance: Patient tolerated treatment well General Behavior During Therapy: [facility] for tasks assessed/performed OT Plan of Care OT Home Exercise Plan: Towel slide for elbow flexion and extension OT Patient Instructions: Handout (scanned), demonstration, and explanation provided. Pt return demonstrated and verbalized good understanding. Consulted and Agree with Plan of Care: Patient  GO    Marry Guan, MS, OTR/L [phone]  11/19/2013, 9:34 AM  Physician Documentation Your signature is required to indicate approval of the treatment plan as stated above.  Please sign and either send electronically or make a copy of this report for your files and return this physician signed original.  Please mark one 1.__approve of plan  2. ___approve of plan with the following conditions.   ______________________________                                                          _____________________ Physician Signature                                                                                                             Date

## 2013-11-23 ENCOUNTER — Ambulatory Visit (HOSPITAL_COMMUNITY)
Admission: RE | Admit: 2013-11-23 | Discharge: 2013-11-23 | Disposition: A | Discharge status: Home / Self Care | Payer: Worker's Compensation | Source: Ambulatory Visit | Attending: Family Medicine | Admitting: Family Medicine

## 2013-11-23 DIAGNOSIS — M25429 Effusion, unspecified elbow: Secondary | ICD-10-CM | POA: Insufficient documentation

## 2013-11-23 DIAGNOSIS — R609 Edema, unspecified: Secondary | ICD-10-CM

## 2013-11-23 DIAGNOSIS — M25621 Stiffness of right elbow, not elsewhere classified: Secondary | ICD-10-CM

## 2013-11-23 DIAGNOSIS — M25539 Pain in unspecified wrist: Secondary | ICD-10-CM

## 2013-11-23 DIAGNOSIS — IMO0001 Reserved for inherently not codable concepts without codable children: Secondary | ICD-10-CM | POA: Insufficient documentation

## 2013-11-23 DIAGNOSIS — M6281 Muscle weakness (generalized): Secondary | ICD-10-CM | POA: Insufficient documentation

## 2013-11-23 DIAGNOSIS — M25529 Pain in unspecified elbow: Secondary | ICD-10-CM | POA: Insufficient documentation

## 2013-11-23 DIAGNOSIS — M25629 Stiffness of unspecified elbow, not elsewhere classified: Secondary | ICD-10-CM | POA: Insufficient documentation

## 2013-11-23 NOTE — Progress Notes (Addendum)
Occupational Therapy Treatment Patient Details  Name: Christy Fletcher MRN: [id number] Date of Birth: [birth year]  Today's Date: 11/23/2013 Time: 4098-1191 OT Time Calculation (min): 40 min MFR 935-950 15' Therex [phone] 25'  Visit#: 2 of 10  Re-eval: 12/17/13    Authorization: Worker's [employer] - [employer]  Authorization Time Period:    Authorization Visit#: 2 of    Subjective Symptoms/Limitations Symptoms: S: I've been working on my exercises at home. My elbow is just a little sore. Nothing really painful.  Pain Assessment Currently in Pain?: Yes Pain Score: 1  Pain Location: Elbow Pain Orientation: Right Pain Type: Acute pain  Precautions/Restrictions  Precautions Precautions: None  Exercise/Treatments Therapy Ball Flexion: 10 reps ABduction: 10 reps Elbow Exercises Elbow Flexion: PROM;AROM;10 reps Elbow Extension: PROM;AROM;10 reps Forearm Supination: PROM;AROM;10 reps Forearm Pronation: PROM;AROM;10 reps Wrist Flexion: PROM;AROM;10 reps Wrist Extension: PROM;AROM;10 reps Other elbow exercises: wrist flexion and extension with elbow in extension; 10X, PROM, AROM   Theraputty - Flatten: yellow - standing     Manual Therapy Manual Therapy: Myofascial release Myofascial Release: MFR and manual stretching completed to RUE forearm, elbow and upper arm region to decrease fascial restrictions and increase joint mobility in a pain free zone.   Occupational Therapy Assessment and Plan OT Assessment and Plan Clinical Impression Statement: A: Pt presents with mild joint limitations during elbow flexion and extension manual stretching and AROM exercises. Pt completed all activities with no complaints of pain.  OT Plan: P: Complete additional theraputty exercises, standing wall plank holds with extended elbow, UBE bike on 1.0 setting. Progress as tolerated.    Goals Short Term Goals Time to Complete Short Term Goals: 2 weeks Short Term Goal 1: Pt will be educated on  HEP Short Term Goal 1 Progress: Progressing toward goal Short Term Goal 2: Pt will attain PROM WFL in right elbow to work towards improved ADL function Short Term Goal 2 Progress: Progressing toward goal Short Term Goal 3: Pt will have strength of at least 3+/5 in right elbow to work towards improved lifting abilites at work Short Term Goal 3 Progress: Progressing toward goal Short Term Goal 4: Pt will have min edema in right elbow region Short Term Goal 4 Progress: Progressing toward goal Short Term Goal 5: Pt will have min fascial restrictions in bicep and tricep regions Short Term Goal 5 Progress: Progressing toward goal Long Term Goals Time to Complete Long Term Goals: 4 weeks Long Term Goal 1: Pt will gain highest level of function using RUE in all ADL, IADL, work, and leisure activities. Long Term Goal 1 Progress: Progressing toward goal Long Term Goal 2: Pt will attain AROM WNL in right elbow in order to wash the back of her neck with her right hand Long Term Goal 2 Progress: Progressing toward goal Long Term Goal 3: Pt will have strength of atleast 4+/5 in right elbow for improved ability to pick up milk jugs at work Long Term Goal 3 Progress: Progressing toward goal Long Term Goal 4: Pt will have traced edema and fascial restricions in right upper extremity Long Term Goal 4 Progress: Progressing toward goal Long Term Goal 5: Pt will report pain levels of less than 1/10 in 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 Long Term Goal 5 Progress: Progressing toward goal  Problem List Patient Active Problem List   Diagnosis Date Noted  . Decreased range of motion of right elbow 11/19/2013  . Muscle weakness (generalized) 11/19/2013  . Edema 11/19/2013  .  Pain in joint, forearm 11/19/2013    End of Session Activity Tolerance: Patient tolerated treatment well General Behavior [facility] Therapy: [facility] tasks assessed/performed   Limmie PatriciaLaura Taji Sather, OTR/L,CBIS   11/23/2013,  11:23 AM

## 2013-11-26 ENCOUNTER — Ambulatory Visit (HOSPITAL_COMMUNITY)
Admission: RE | Admit: 2013-11-26 | Discharge: 2013-11-26 | Disposition: A | Discharge status: Home / Self Care | Payer: Worker's Compensation | Source: Ambulatory Visit | Attending: Family Medicine | Admitting: Family Medicine

## 2013-11-26 NOTE — Progress Notes (Signed)
Occupational Therapy Treatment Patient Details  Name: Christy MedinMargaret S Fletcher MRN: [id number] Date of Birth: 10/29/1940  Today's Date: 11/26/2013 Time: [id number] OT Time Calculation (min): 43 min Manual [phone] (16') Therapeutic Exercises 820-847 (27')  Visit#: 3 of 10  Re-eval: 12/17/13    Authorization: Worker's Comp - [employer]  Authorization Time Period:    Authorization Visit#: 3 of    Subjective Symptoms/Limitations Symptoms: S" "I 've been trying to remember the exercises she gave me to do at home. I wasn't really sore after last time." Pain Assessment Currently in Pain?: No/denies  Precautions/Restrictions     Exercise/Treatments ROM / Strengthening / Isometric Strengthening UBE (Upper Arm Bike): 2 min forward and back on 1.0   Elbow Exercises Elbow Flexion: PROM;AROM;10 reps Elbow Extension: PROM;AROM;10 reps Forearm Supination: PROM;AROM;10 reps Forearm Pronation: PROM;AROM;10 reps Wrist Flexion: AROM;10 reps Wrist Extension: AROM;10 reps Other elbow exercises: wrist flexion and extension with elbow in extension; 10X, PROM, AROM   Theraputty - Flatten: yellow - standing Theraputty - Roll: pink- standing - for elbow flexion/extension     Manual Therapy Manual Therapy: Myofascial release Myofascial Release: MFR and manual stretching completed to RUE forearm, elbow and upper arm region to decrease fascial restrictions and increase joint mobility in a pain free zone  Occupational Therapy Assessment and Plan OT Assessment and Plan Clinical Impression Statement: A: Pt demonstrated good understaning of exercises she is doing at home. Added UBE and putty rolling this session - pt had no complaints of discomfort. OT Plan: P: Follow up on on UBE and theraputty. Add standing wall plank holds.   Goals Short Term Goals Short Term Goal 1: Pt will be educated on HEP Short Term Goal 1 Progress: Progressing toward goal Short Term Goal 2: Pt will attain PROM WFL in right elbow  to work towards improved ADL function Short Term Goal 2 Progress: Progressing toward goal Short Term Goal 3: Pt will have strength of at least 3+/5 in right elbow to work towards improved lifting abilites at work Short Term Goal 3 Progress: Progressing toward goal Short Term Goal 4: Pt will have min edema in right elbow region Short Term Goal 4 Progress: Progressing toward goal Short Term Goal 5: Pt will have min fascial restrictions in bicep and tricep regions Short Term Goal 5 Progress: Progressing toward goal Long Term Goals Long Term Goal 1: Pt will gain highest level of function using RUE in all ADL, IADL, work, and leisure activities. Long Term Goal 1 Progress: Progressing toward goal Long Term Goal 2: Pt will attain AROM WNL in right elbow in order to wash the back of her neck with her right hand Long Term Goal 2 Progress: Progressing toward goal Long Term Goal 3: Pt will have strength of atleast 4+/5 in right elbow for improved ability to pick up milk jugs at work Long Term Goal 3 Progress: Progressing toward goal Long Term Goal 4: Pt will have traced edema and fascial restricions in right upper extremity Long Term Goal 4 Progress: Progressing toward goal Long Term Goal 5: Pt will report pain levels of less than 1/10 in 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 Long Term Goal 5 Progress: Progressing toward goal  Problem List Patient Active Problem List   Diagnosis Date Noted  . Decreased range of motion of right elbow 11/19/2013  . Muscle weakness (generalized) 11/19/2013  . Edema 11/19/2013  . Pain in joint, forearm 11/19/2013    End of Session Activity Tolerance: Patient tolerated treatment well  General Behavior During Therapy: [facility] for tasks assessed/performed  GO    Marry Guan, MS, OTR/L [phone]  11/26/2013, 9:02 AM

## 2013-11-28 ENCOUNTER — Ambulatory Visit (HOSPITAL_COMMUNITY)
Admission: RE | Admit: 2013-11-28 | Discharge: 2013-11-28 | Disposition: A | Discharge status: Home / Self Care | Payer: Worker's Compensation | Source: Ambulatory Visit | Attending: Family Medicine | Admitting: Family Medicine

## 2013-11-28 NOTE — Progress Notes (Signed)
Occupational Therapy Treatment Patient Details  Name: Christy Fletcher MRN: [id number] Date of Birth: Mar 14, 1941  Today's Date: 11/28/2013 Time: 4098-1191 OT Time Calculation (min): 41 min Manual 850-900 (10') Therapeutic Exercises 900-931 (31')  Visit#: 4 of 10  Re-eval: 12/17/13    Authorization: [employer] - [employer]  Authorization Time Period:    Authorization Visit#: 4 of    Subjective Symptoms/Limitations Symptoms: S: I didn't have any of that putty at home - so I rolled up a towel and pushed into it, and then I rolled with my rollin gpin, too." Pain Assessment Currently in Pain?: No/denies  Precautions/Restrictions     Exercise/Treatments Elbow Exercises Elbow Flexion: PROM;AROM;10 reps (1 lb ) Elbow Extension: PROM;AROM;10 reps (1 lb ) Forearm Supination: PROM;AROM;10 reps (1lb) Forearm Pronation: PROM;AROM;10 reps (1lb) Wrist Flexion: AROM;10 reps (1lb) Wrist Extension: AROM;10 reps (1lb) Other elbow exercises: wrist flexion and extension stretch with elbow extended - 10sec hold 3x   Theraputty - Flatten: pink, in standing Theraputty - Roll: pink- standing - for elbow flexion/extension, cueing for full elbow extension (rather than shoulder protraction)  Wrist Exercises Forearm Supination: PROM;AROM;10 reps (1lb) Forearm Pronation: PROM;AROM;10 reps (1lb) Wrist Flexion: AROM;10 reps (1lb) Wrist Extension: AROM;10 reps (1lb)   Theraputty - Flatten: pink, in standing Theraputty - Roll: pink- standing - for elbow flexion/extension, cueing for full elbow extension (rather than shoulder protraction)  Wrist Weighted Stretch Supination - Weighted Stretch: 1 pound;60 seconds Wrist Flexion - Weighted Stretch: 1 pound;60 seconds Wrist Extension - Weighted Stretch: 1 pound;60 seconds Radial Deviation - Weighted Stretch: 1 pound;60 seconds  Fine Motor Coordination Supination - Weighted Stretch: 1 pound;60 seconds Wrist Flexion - Weighted Stretch: 1 pound;60  seconds Wrist Extension - Weighted Stretch: 1 pound;60 seconds Radial Deviation - Weighted Stretch: 1 pound;60 seconds     Manual Therapy Manual Therapy: Myofascial release Myofascial Release: MFR and manual stretching completed to RUE forearm, elbow and upper arm region to decrease fascial restrictions and increase joint mobility in a pain free zone  Occupational Therapy Assessment and Plan OT Assessment and Plan Clinical Impression Statement: A: Pt is demonstrating very high motivation, mimicing session exercises at home prior to HEP. Pt tolerated well exercises with 1# weight with no comlaints of discomfort - just that stretches were effective. OT Plan: P: Add standing wall plank holds. Potential to add HEP for theraputty and 1lb weight at home.   Goals Short Term Goals Short Term Goal 1: Pt will be educated on HEP Short Term Goal 1 Progress: Progressing toward goal Short Term Goal 2: Pt will attain PROM WFL in right elbow to work towards improved ADL function Short Term Goal 2 Progress: Progressing toward goal Short Term Goal 3: Pt will have strength of at least 3+/5 in right elbow to work towards improved lifting abilites at work Short Term Goal 3 Progress: Progressing toward goal Short Term Goal 4: Pt will have min edema in right elbow region Short Term Goal 4 Progress: Progressing toward goal Short Term Goal 5: Pt will have min fascial restrictions in bicep and tricep regions Short Term Goal 5 Progress: Progressing toward goal Long Term Goals Long Term Goal 1 Progress: Progressing toward goal Long Term Goal 2: Pt will attain AROM WNL in right elbow in order to wash the back of her neck with her right hand Long Term Goal 2 Progress: Progressing toward goal Long Term Goal 3: Pt will have strength of atleast 4+/5 in right elbow for improved ability to pick up  milk jugs at work Long Term Goal 3 Progress: Progressing toward goal Long Term Goal 4: Pt will have traced edema and fascial  restricions in right upper extremity Long Term Goal 4 Progress: Progressing toward goal Long Term Goal 5: Pt will report pain levels of less than 1/10 in 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 Long Term Goal 5 Progress: Progressing toward goal  Problem List Patient Active Problem List   Diagnosis Date Noted  . Decreased range of motion of right elbow 11/19/2013  . Muscle weakness (generalized) 11/19/2013  . Edema 11/19/2013  . Pain in joint, forearm 11/19/2013    End of Session Activity Tolerance: Patient tolerated treatment well General Behavior During Therapy: [facility] for tasks assessed/performed  GO    Marry GuanMarie Rawlings, MS, OTR/L [phone]  11/28/2013, 12:15 PM

## 2013-11-30 ENCOUNTER — Ambulatory Visit (HOSPITAL_COMMUNITY)
Admission: RE | Admit: 2013-11-30 | Discharge: 2013-11-30 | Disposition: A | Discharge status: Home / Self Care | Payer: Worker's Compensation | Source: Ambulatory Visit | Attending: Family Medicine | Admitting: Family Medicine

## 2013-11-30 NOTE — Progress Notes (Signed)
Occupational Therapy Treatment Patient Details  Name: Christy Fletcher MRN: [id number] Date of Birth: 1940-10-11  Today's Date: 11/30/2013 Time: 4098-1191 OT Time Calculation (min): 41 min Manual 803-815 (12') Therapeutic Exercises 815-844 (29')  Visit#: 5 of 10  Re-eval: 12/17/13    Authorization: [employer]  Authorization Time Period:    Authorization Visit#: 5 of    Subjective Symptoms/Limitations Symptoms: S: Its getting better (reaching back towards shoulder with right arm). Pain Assessment Currently in Pain?: No/denies  Precautions/Restrictions     Exercise/Treatments  Therapy Ball Flexion:  (12 reps) ABduction:  (12 reps) Elbow Exercises Elbow Flexion: PROM;AROM;10 reps Elbow Extension: PROM;AROM;10 reps Forearm Supination: PROM;AROM;10 reps Forearm Pronation: PROM;AROM;10 reps Wrist Flexion: AROM;10 reps Wrist Extension: AROM;10 reps Other elbow exercises: ismoetrics 3x10" elbow flexion and extension   Wall Pushups/Modified Pushups: 10 reps, and 5 more reps - cueing for full elbow extension and flexion  Wrist Exercises Forearm Supination: PROM;AROM;10 reps Forearm Pronation: PROM;AROM;10 reps Wrist Flexion: AROM;10 reps Wrist Extension: AROM;10 reps     Wrist Weighted Stretch Supination - Weighted Stretch: 1 pound;60 seconds Wrist Flexion - Weighted Stretch: 1 pound;60 seconds Wrist Extension - Weighted Stretch: 1 pound;60 seconds Radial Deviation - Weighted Stretch: 1 pound;60 seconds  Fine Motor Coordination Supination - Weighted Stretch: 1 pound;60 seconds Wrist Flexion - Weighted Stretch: 1 pound;60 seconds Wrist Extension - Weighted Stretch: 1 pound;60 seconds Radial Deviation - Weighted Stretch: 1 pound;60 seconds     Manual Therapy Manual Therapy: Myofascial release Myofascial Release: MFR and manual stretching completed to RUE forearm, elbow and upper arm region to decrease fascial restrictions and increase joint mobility in a  pain free zone  Occupational Therapy Assessment and Plan OT Assessment and Plan Clinical Impression Statement: A: Pt tolerated well addition of wall push-ups, and was highly motivated to get form right.  OT Plan: P: Follo0w up on wall push ups. Try standing wall plank holds. Potential to add HEP for theraputty. Follow up on 1lb weight at home.   Goals Short Term Goals Short Term Goal 1: Pt will be educated on HEP Short Term Goal 1 Progress: Progressing toward goal Short Term Goal 2: Pt will attain PROM WFL in right elbow to work towards improved ADL function Short Term Goal 2 Progress: Progressing toward goal Short Term Goal 3: Pt will have strength of at least 3+/5 in right elbow to work towards improved lifting abilites at work Short Term Goal 3 Progress: Progressing toward goal Short Term Goal 4: Pt will have min edema in right elbow region Short Term Goal 4 Progress: Progressing toward goal Short Term Goal 5: Pt will have min fascial restrictions in bicep and tricep regions Short Term Goal 5 Progress: Progressing toward goal Long Term Goals Long Term Goal 1: Pt will gain highest level of function using RUE in all ADL, IADL, work, and leisure activities. Long Term Goal 1 Progress: Progressing toward goal Long Term Goal 2: Pt will attain AROM WNL in right elbow in order to wash the back of her neck with her right hand Long Term Goal 2 Progress: Progressing toward goal Long Term Goal 3: Pt will have strength of atleast 4+/5 in right elbow for improved ability to pick up milk jugs at work Long Term Goal 3 Progress: Progressing toward goal Long Term Goal 4: Pt will have traced edema and fascial restricions in right upper extremity Long Term Goal 4 Progress: Progressing toward goal Long Term Goal 5: Pt will report pain levels  of less than 1/10 in 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 Long Term Goal 5 Progress: Progressing toward goal  Problem List Patient Active Problem List    Diagnosis Date Noted  . Decreased range of motion of right elbow 11/19/2013  . Muscle weakness (generalized) 11/19/2013  . Edema 11/19/2013  . Pain in joint, forearm 11/19/2013    End of Session Activity Tolerance: Patient tolerated treatment well General Behavior During Therapy: [facility] for tasks assessed/performed OT Plan of Care OT Home Exercise Plan: Wrsit flexion/extenion, supination/pronation, elbow flexion/extension with 1 lb weight OT Patient Instructions: pt verbalized understanding Consulted and Agree with Plan of Care: Patient  GO    Marry GuanMarie Rawlings, MS, OTR/L [phone]  11/30/2013, 8:51 AM

## 2013-12-03 ENCOUNTER — Ambulatory Visit (HOSPITAL_COMMUNITY)
Admission: RE | Admit: 2013-12-03 | Discharge: 2013-12-03 | Disposition: A | Discharge status: Home / Self Care | Payer: Worker's Compensation | Source: Ambulatory Visit | Attending: Family Medicine | Admitting: Family Medicine

## 2013-12-03 DIAGNOSIS — R609 Edema, unspecified: Secondary | ICD-10-CM

## 2013-12-03 DIAGNOSIS — M25539 Pain in unspecified wrist: Secondary | ICD-10-CM

## 2013-12-03 DIAGNOSIS — M25621 Stiffness of right elbow, not elsewhere classified: Secondary | ICD-10-CM

## 2013-12-03 NOTE — Progress Notes (Signed)
Occupational Therapy Treatment Patient Details  Name: Christy Fletcher MRN: [id number] Date of Birth: 02-06-1941  Today's Date: 12/03/2013 Time: 4098-11910850-0933 OT Time Calculation (min): 43 min Manual therapy 850-903 13' Splinting 903-920 17' splint materials charge $19.20 Therapeutic [phone] 13'  Visit#: 6 of 10  Re-eval: 12/17/13    Authorization: [employer]  Subjective S:  I really have been trying to do my exercises at home.  Pain Assessment Currently in Pain?: No/denies Pain Score: 0-No pain  Exercise/Treatments Elbow Exercises Elbow Flexion: PROM;10 reps Elbow Extension: PROM;10 reps Forearm Supination: PROM;10 reps Forearm Pronation: PROM;10 reps Wrist Flexion: PROM;10 reps Wrist Extension: PROM;10 reps   Theraputty - Flatten: pink, in standing Theraputty - Roll: pink seated, to promote elbow flexion and extension Theraputty - Grip: with forearm supinated and pronated     Manual Therapy Manual Therapy: Myofascial release Myofascial Release: MFR and manual stretching completed to RUE forearm, elbow and upper arm region to decrease fascial restrictions and increase joint mobility in a pain free zone Splinting Splinting: fabricated volar based elbow extension splint, positioning patient in +10 degrees of extension.  Patient educated to wear splint at night to improve extension to 0.  Educated on wear,care, contraindications and demonstrated independence with donning and doffing splint.   Occupational Therapy Assessment and Plan OT Assessment and Plan Clinical Impression Statement: A:  Fabricated elbow extension splint for night use to promote full elbow extension.  OT Plan: P:  Reassess for MD visit.  Begin elbow and wrist strengthening.    Goals Short Term Goals Short Term Goal 1: Pt will be educated on HEP Short Term Goal 2: Pt will attain PROM WFL in right elbow to work towards improved ADL function Short Term Goal 3: Pt will have strength of  at least 3+/5 in right elbow to work towards improved lifting abilites at work Short Term Goal 4: Pt will have min edema in right elbow region Short Term Goal 5: Pt will have min fascial restrictions in bicep and tricep regions Long Term Goals Long Term Goal 1: Pt will gain highest level of function using RUE in all ADL, IADL, work, and leisure activities. Long Term Goal 2: Pt will attain AROM WNL in right elbow in order to wash the back of her neck with her right hand Long Term Goal 3: Pt will have strength of atleast 4+/5 in right elbow for improved ability to pick up milk jugs at work Long Term Goal 4: Pt will have traced edema and fascial restricions in right upper extremity Long Term Goal 5: Pt will report pain levels of less than 1/10 in 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  Problem List Patient Active Problem List   Diagnosis Date Noted  . Decreased range of motion of right elbow 11/19/2013  . Muscle weakness (generalized) 11/19/2013  . Edema 11/19/2013  . Pain in joint, forearm 11/19/2013    End of Session Activity Tolerance: Patient tolerated treatment well General Behavior During Therapy: [facility] for tasks assessed/performed OT Plan of Care OT Patient Instructions: splint wear and care.  GO    Shirlean MylarBethany H. Keena Heesch, OTR/L  12/03/2013, 11:42 AM

## 2013-12-05 ENCOUNTER — Ambulatory Visit (HOSPITAL_COMMUNITY)
Admission: RE | Admit: 2013-12-05 | Discharge: 2013-12-05 | Disposition: A | Discharge status: Home / Self Care | Payer: Worker's Compensation | Source: Ambulatory Visit | Attending: Family Medicine | Admitting: Family Medicine

## 2013-12-05 DIAGNOSIS — M25539 Pain in unspecified wrist: Secondary | ICD-10-CM

## 2013-12-05 DIAGNOSIS — R609 Edema, unspecified: Secondary | ICD-10-CM

## 2013-12-05 DIAGNOSIS — M25621 Stiffness of right elbow, not elsewhere classified: Secondary | ICD-10-CM

## 2013-12-05 NOTE — Evaluation (Signed)
Occupational Therapy Evaluation  Patient Details  Name: Christy Fletcher MRN: [id number] Date of Birth: [birth year]  Today's Date: 12/05/2013 Time: 4098-1191 OT Time Calculation (min): 60 min Manual therarpy 478-295 72' reassesment 910-930 69' Therapeutic exercises 930-955 25'  Visit#: 7 of 16  Re-eval: 12/17/13  Assessment Diagnosis: right Elbow radial head fracture  Authorization: [employer] - [employer]  Authorization Time Period:    Authorization Visit#: 7 of     Past Medical History:  Past Medical History  Diagnosis Date  . Hypercholesteremia   . Arthritis   . Osteopenia   . Diarrhea    Past Surgical History:  Past Surgical History  Procedure Laterality Date  . Abdominal hysterectomy    . Cataract extraction    . Colonoscopy    . Colonoscopy N/A 05/14/2013    Procedure: COLONOSCOPY;  Surgeon: Danie Binder, MD;  Location: [facility];  Service: Endoscopy;  Laterality: N/A;  10:30 AM-rescheduled to 8:45am Doris notified pt    Subjective Symptoms/Limitations Symptoms: S:  Its a bit sore.  Special Tests: FOTO was 41/100 and is currently 59/100  Sensation/Coordination/Edema Edema Edema: 1" distal to elbow crease right 23.5 cm left 22.5 cm   Additional Assessments RUE AROM (degrees) Right Elbow Flexion: 140 (113) Right Elbow Extension:  (+20 (+27)) Right Forearm Pronation: 80 Degrees Right Forearm Supination: 90 Degrees Right Wrist Extension: 58 Degrees Right Wrist Flexion: 70 Degrees RUE Strength Right Elbow Flexion: 5/5 (3-/5) Right Elbow Extension: 5/5 (3-/5) Right Forearm Pronation:  (4+/5) Right Forearm Supination:  (4+/5) Right Wrist Flexion:  (4+/5) Right Wrist Extension:  (4+/5) Grip (lbs): 40 (17) Lateral Pinch: 8 lbs (9) 3 Point Pinch: 8 lbs (8) Palpation Palpation: minimal fascial restrictions Right Hand Strength - Pinch (lbs) Lateral Pinch: 8 lbs (9) 3 Point Pinch: 8 lbs (8)   Exercise/Treatments Elbow Exercises Elbow Flexion:  PROM;Strengthening;10 reps Bar Weights/Barbell (Elbow Flexion): 1 lb Elbow Extension: PROM;Strengthening;10 reps Bar Weights/Barbell (Elbow Extension): 1 lb Forearm Supination: PROM;Strengthening;10 reps (1 pounds) Forearm Pronation: PROM;Strengthening;10 reps (1 pounds) Wrist Flexion: PROM;Strengthening;10 reps (1 pounds) Wrist Extension: PROM;Strengthening;10 reps (1 pounds)   Theraputty - Flatten: pink, in standing Theraputty - Roll: pink seated, to promote elbow flexion and extension Theraputty - Grip: with forearm supinated and pronated        Occupational Therapy Assessment and Plan OT Assessment and Plan Clinical Impression Statement: A:  Reassessment completed this date.  Patient has made significant improvements in AROM, strength, and edema.  She is using her right arm at at least 60% prior level of independence.  She has not returned to work or attempted lifting anything heavy.  OT Frequency: Min 2X/week OT Duration: 4 weeks OT Plan: P:  Increase repetitions iwth strengthening, add UBE.   Goals Short Term Goals Short Term Goal 1: Pt will be educated on HEP Short Term Goal 1 Progress: Met Short Term Goal 2: Pt will attain PROM WFL in right elbow to work towards improved ADL function Short Term Goal 2 Progress: Met Short Term Goal 3: Pt will have strength of at least 3+/5 in right elbow to work towards improved lifting abilites at work Short Term Goal 3 Progress: Met Short Term Goal 4: Pt will have min edema in right elbow region Short Term Goal 4 Progress: Met Short Term Goal 5: Pt will have min fascial restrictions in bicep and tricep regions Short Term Goal 5 Progress: Met Long Term Goals Long Term Goal 1: Pt will gain highest level of function  using RUE in all ADL, IADL, work, and leisure activities. Long Term Goal 1 Progress: Progressing toward goal Long Term Goal 2: Pt will attain AROM WNL in right elbow in order to wash the back of her neck with her right  hand Long Term Goal 2 Progress: Progressing toward goal Long Term Goal 3: Pt will have strength of 5/5 in right elbow for improved ability to pick up milk jugs at work Long Term Goal 3 Progress: Progressing toward goal Long Term Goal 4: Pt will have traced edema and fascial restricions in right upper extremity Long Term Goal 4 Progress: Progressing toward goal Long Term Goal 5: Pt will report pain levels of less than 1/10 in 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 Long Term Goal 5 Progress: Progressing toward goal  Problem List Patient Active Problem List   Diagnosis Date Noted  . Decreased range of motion of right elbow 11/19/2013  . Muscle weakness (generalized) 11/19/2013  . Edema 11/19/2013  . Pain in joint, forearm 11/19/2013    End of Session Activity Tolerance: Patient tolerated treatment well General Behavior During Therapy: WFL for tasks assessed/performed OT Plan of Care OT Home Exercise Plan: added 1# to elbow-wrist strengthening.   GO    Vangie Bicker, OTR/L  12/05/2013, 10:11 AM  Physician Documentation Your signature is required to indicate approval of the treatment plan as stated above.  Please sign and either send electronically or make a copy of this report for your files and return this physician signed original.  Please mark one 1.__approve of plan  2. ___approve of plan with the following conditions.   ______________________________                                                          _____________________ Physician Signature                                                                                                             Date

## 2013-12-10 ENCOUNTER — Ambulatory Visit (HOSPITAL_COMMUNITY)
Admission: RE | Admit: 2013-12-10 | Discharge: 2013-12-10 | Disposition: A | Discharge status: Home / Self Care | Payer: Worker's Compensation | Source: Ambulatory Visit | Attending: Family Medicine | Admitting: Family Medicine

## 2013-12-10 NOTE — Progress Notes (Signed)
Occupational Therapy Treatment Patient Details  Name: Christy Fletcher MRN: [id number] Date of Birth: November 09, 1940  Today's Date: 12/10/2013 Time: 9244-6286 OT Time Calculation (min): 47 min Manual 845-900 (15') Therapeutic Exercises 900-932 (33')  Visit#: 8 of 16  Re-eval: 12/17/13    Authorization: [employer] - [employer]  Authorization Time Period:    Authorization Visit#: 8 of    Subjective Symptoms/Limitations Symptoms: "i just hurt when I lift my arm." Pain Assessment Currently in Pain?: No/denies  Precautions/Restrictions     Exercise/Treatments ROM / Strengthening / Isometric Strengthening UBE (Upper Arm Bike): 2 min forward and back on 1.0   Elbow Exercises Elbow Flexion: PROM;Strengthening;10 reps Bar Weights/Barbell (Elbow Flexion): 1 lb Elbow Extension: PROM;Strengthening;10 reps Bar Weights/Barbell (Elbow Extension): 1 lb Forearm Supination: PROM;Strengthening;10 reps Forearm Pronation: PROM;Strengthening;10 reps Wrist Flexion: PROM;10 reps;Strengthening Wrist Extension: PROM;Strengthening;10 reps   Theraputty - Flatten: pink, in standing Theraputty - Roll: pink seated, to promote elbow flexion and extension Theraputty - Grip: pink, with forearm supinated and pronated  Wrist Exercises Forearm Supination: PROM;Strengthening;10 reps Forearm Pronation: PROM;Strengthening;10 reps Wrist Flexion: PROM;10 reps;Strengthening Wrist Extension: PROM;Strengthening;10 reps Wrist Radial Deviation: Strengthening;10 reps   Theraputty - Flatten: pink, in standing Theraputty - Roll: pink seated, to promote elbow flexion and extension Theraputty - Grip: pink, with forearm supinated and pronated  Wrist Weighted Stretch Supination - Weighted Stretch: 1 pound;60 seconds Wrist Flexion - Weighted Stretch: 1 pound;60 seconds Wrist Extension - Weighted Stretch: 1 pound;60 seconds Radial Deviation - Weighted Stretch: 1 pound;60 seconds  Fine Motor Coordination Supination  - Weighted Stretch: 1 pound;60 seconds Wrist Flexion - Weighted Stretch: 1 pound;60 seconds Wrist Extension - Weighted Stretch: 1 pound;60 seconds Radial Deviation - Weighted Stretch: 1 pound;60 seconds     Manual Therapy Manual Therapy: Myofascial release Myofascial Release: MFR and manual stretching completed to RUE forearm, elbow and upper arm region to decrease fascial restrictions and increase joint mobility in a pain free zone  Occupational Therapy Assessment and Plan OT Assessment and Plan Clinical Impression Statement: Pt returns to MD on April 9th. Pt tolerated well all stretches. She verbalized good use of exercises at home. Pt had good results with UBE for elbow extension. OT Plan: P:  Increase repetitions with 1# strengthening.   Goals Short Term Goals Short Term Goal 1: Pt will be educated on HEP Short Term Goal 1 Progress: Met Short Term Goal 2: Pt will attain PROM WFL in right elbow to work towards improved ADL function Short Term Goal 2 Progress: Met Short Term Goal 3: Pt will have strength of at least 3+/5 in right elbow to work towards improved lifting abilites at work Short Term Goal 3 Progress: Met Short Term Goal 4: Pt will have min edema in right elbow region Short Term Goal 4 Progress: Met Short Term Goal 5: Pt will have min fascial restrictions in bicep and tricep regions Short Term Goal 5 Progress: Met Long Term Goals Long Term Goal 1: Pt will gain highest level of function using RUE in all ADL, IADL, work, and leisure activities. Long Term Goal 1 Progress: Progressing toward goal Long Term Goal 2: Pt will attain AROM WNL in right elbow in order to wash the back of her neck with her right hand Long Term Goal 2 Progress: Progressing toward goal Long Term Goal 3: Pt will have strength of 5/5 in right elbow for improved ability to pick up milk jugs at work Long Term Goal 3 Progress: Progressing toward goal Long Term Goal  4: Pt will have traced edema and fascial  restricions in right upper extremity Long Term Goal 4 Progress: Progressing toward goal Long Term Goal 5: Pt will report pain levels of less than 1/10 in 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 Long Term Goal 5 Progress: Progressing toward goal  Problem List Patient Active Problem List   Diagnosis Date Noted  . Decreased range of motion of right elbow 11/19/2013  . Muscle weakness (generalized) 11/19/2013  . Edema 11/19/2013  . Pain in joint, forearm 11/19/2013    End of Session Activity Tolerance: Patient tolerated treatment well General Behavior During Therapy: [facility] for tasks assessed/performed  GO    Bea Graff, MS, OTR/L [phone]  12/10/2013, 12:01 PM

## 2013-12-12 ENCOUNTER — Ambulatory Visit (HOSPITAL_COMMUNITY)
Admission: RE | Admit: 2013-12-12 | Discharge: 2013-12-12 | Disposition: A | Discharge status: Home / Self Care | Payer: Worker's Compensation | Source: Ambulatory Visit | Attending: Family Medicine | Admitting: Family Medicine

## 2013-12-12 DIAGNOSIS — M25621 Stiffness of right elbow, not elsewhere classified: Secondary | ICD-10-CM

## 2013-12-12 DIAGNOSIS — R609 Edema, unspecified: Secondary | ICD-10-CM

## 2013-12-12 DIAGNOSIS — M25539 Pain in unspecified wrist: Secondary | ICD-10-CM

## 2013-12-12 NOTE — Progress Notes (Signed)
Occupational Therapy Treatment Patient Details  Name: Christy Fletcher MRN: [id number] Date of Birth: 20-Dec-1940  Today's Date: 12/12/2013 Time: 3086-5784 OT Time Calculation (min): 42 min MFR 848-900  12' Therex 900-930 30'  Visit#: 9 of 16  Re-eval: 12/17/13    Authorization: [employer] - [employer]  Authorization Time Period:    Authorization Visit#: 9 of    Subjective Symptoms/Limitations Symptoms: S: I have a MD appointment on the 9th.  Pain Assessment Currently in Pain?: No/denies  Precautions/Restrictions  Precautions Precautions: None  Exercise/Treatments Elbow Exercises Elbow Flexion: PROM;10 reps;Strengthening;15 reps Bar Weights/Barbell (Elbow Flexion): 1 lb Elbow Extension: PROM;10 reps;Strengthening;15 reps Bar Weights/Barbell (Elbow Extension): 1 lb Forearm Supination: PROM;10 reps;Strengthening;15 reps Forearm Pronation: PROM;10 reps;Strengthening;15 reps Wrist Flexion: PROM;10 reps;Strengthening;15 reps Wrist Extension: PROM;10 reps;Strengthening;15 reps   Wall Pushups/Modified Pushups: 10 reps focusing on elbow extension and flexion Theraputty - Flatten: pink, in standing Theraputty - Roll: pink seated, to promote elbow flexion and extension  Wrist Exercises Forearm Supination: PROM;10 reps;Strengthening;15 reps Forearm Pronation: PROM;10 reps;Strengthening;15 reps Wrist Flexion: PROM;10 reps;Strengthening;15 reps Wrist Extension: PROM;10 reps;Strengthening;15 reps Wrist Radial Deviation: PROM;10 reps;Strengthening;15 reps   Theraputty - Flatten: pink, in standing Theraputty - Roll: pink seated, to promote elbow flexion and extension      Manual Therapy Manual Therapy: Myofascial release Myofascial Release: MFR and manual stretching completed to RUE forearm, elbow and upper arm region to decrease fascial restrictions and increase joint mobility in a pain free zone  Occupational Therapy Assessment and Plan OT Assessment and Plan Clinical  Impression Statement: A: Patient tolerated all exercises with proper form and technique. Increased reps with weighted exercises. Patient tolerated well. Patient states that she is going to [facility] until next Thursday. Reviewed HEP exercises and made tips and recommendations where needed.  OT Plan: P: Reassess   Goals Short Term Goals Short Term Goal 1: Pt will be educated on HEP Short Term Goal 2: Pt will attain PROM WFL in right elbow to work towards improved ADL function Short Term Goal 3: Pt will have strength of at least 3+/5 in right elbow to work towards improved lifting abilites at work Short Term Goal 4: Pt will have min edema in right elbow region Short Term Goal 5: Pt will have min fascial restrictions in bicep and tricep regions Long Term Goals Long Term Goal 1: Pt will gain highest level of function using RUE in all ADL, IADL, work, and leisure activities. Long Term Goal 1 Progress: Progressing toward goal Long Term Goal 2: Pt will attain AROM WNL in right elbow in order to wash the back of her neck with her right hand Long Term Goal 2 Progress: Progressing toward goal Long Term Goal 3: Pt will have strength of 5/5 in right elbow for improved ability to pick up milk jugs at work Long Term Goal 3 Progress: Progressing toward goal Long Term Goal 4: Pt will have traced edema and fascial restricions in right upper extremity Long Term Goal 4 Progress: Progressing toward goal Long Term Goal 5: Pt will report pain levels of less than 1/10 in 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 Long Term Goal 5 Progress: Progressing toward goal  Problem List Patient Active Problem List   Diagnosis Date Noted  . Decreased range of motion of right elbow 11/19/2013  . Muscle weakness (generalized) 11/19/2013  . Edema 11/19/2013  . Pain in joint, forearm 11/19/2013    End of Session Activity Tolerance: Patient tolerated treatment well General Behavior  During Therapy: [facility] for tasks  assessed/performed   Limmie PatriciaLaura Lalla Laham, OTR/L,CBIS   12/12/2013, 9:35 AM

## 2013-12-20 ENCOUNTER — Ambulatory Visit (HOSPITAL_COMMUNITY)
Admission: RE | Admit: 2013-12-20 | Discharge: 2013-12-20 | Disposition: A | Discharge status: Home / Self Care | Payer: Worker's Compensation | Source: Ambulatory Visit | Attending: Family Medicine | Admitting: Family Medicine

## 2013-12-20 DIAGNOSIS — M25629 Stiffness of unspecified elbow, not elsewhere classified: Secondary | ICD-10-CM | POA: Insufficient documentation

## 2013-12-20 DIAGNOSIS — M25529 Pain in unspecified elbow: Secondary | ICD-10-CM | POA: Insufficient documentation

## 2013-12-20 DIAGNOSIS — M25429 Effusion, unspecified elbow: Secondary | ICD-10-CM | POA: Diagnosis not present

## 2013-12-20 DIAGNOSIS — IMO0001 Reserved for inherently not codable concepts without codable children: Secondary | ICD-10-CM | POA: Insufficient documentation

## 2013-12-20 DIAGNOSIS — M6281 Muscle weakness (generalized): Secondary | ICD-10-CM | POA: Insufficient documentation

## 2013-12-20 NOTE — Evaluation (Signed)
Occupational Therapy Re-Evaluation  Patient Details  Name: Christy Fletcher MRN: [id number] Date of Birth: Jul 19, 1941  Today's Date: 12/20/2013 Time: 4081-4481 OT Time Calculation (min): 41 min Manual 850-900 (10') ROM Measurements 900-920 (20') Therapeutic Exercises 856-314 (44')  Visit#: 10 of 16  Re-eval: 01/03/14  Assessment Diagnosis: right Elbow radial head fracture  Authorization: [employer] - [employer]  Authorization Time Period:    Authorization Visit#: 10 of     Past Medical History:  Past Medical History  Diagnosis Date  . Hypercholesteremia   . Arthritis   . Osteopenia   . Diarrhea    Past Surgical History:  Past Surgical History  Procedure Laterality Date  . Abdominal hysterectomy    . Cataract extraction    . Colonoscopy    . Colonoscopy N/A 05/14/2013    Procedure: COLONOSCOPY;  Surgeon: Danie Binder, MD;  Location: [facility];  Service: Endoscopy;  Laterality: N/A;  10:30 AM-rescheduled to 8:45am Doris notified pt    Subjective Symptoms/Limitations Symptoms: "i can do some things a little easier, and I can lift a little more than I've been doing." Pain Assessment Currently in Pain?: Yes Pain Score: 1  Pain Location: Elbow Pain Orientation: Right Pain Type: Acute pain  Assessment ADL/Vision/Perception ADL ADL Comments: Pt reports better reach to the back of her neck with her right hand. Pt report now difficulty with dong her hair with her right hand or putting on earrings.   Sensation/Coordination/Edema Coordination 9 Hole Peg Test: Right 19.62 seconds (Previous Right 20.12 seconds; Left 22.77 seconds) Edema Edema: 1" distal to elbow crease right 25 cm left 24 cm  (Previous 1" distal to elbow crease right 23.5 cm left 22.5 c)  Additional Assessments RUE AROM (degrees) RUE Overall AROM Comments: Assessed in sitting Right Elbow Flexion: 142 (140) Right Elbow Extension: 12 ((Previous +20)) Right Forearm Pronation: 84 Degrees  (80) Right Forearm Supination: 90 Degrees (90) Right Wrist Extension: 58 Degrees (58) Right Wrist Flexion: 74 Degrees (70) RUE Strength Right Elbow Flexion: 5/5 (5/5) Right Elbow Extension: 5/5 (5/5) Right Forearm Pronation: 5/5 (4+/5) Right Forearm Supination: 5/5 (4+/5) Right Wrist Flexion: 5/5 (4+/5) Right Wrist Extension: 5/5 (4+/5) Grip (lbs): 42 (Previous 40) Palpation Palpation: trace fascial restrictions     Exercise/Treatments Elbow Exercises Elbow Flexion: PROM;10 reps;Strengthening;15 reps Bar Weights/Barbell (Elbow Flexion): 1 lb Elbow Extension: PROM;10 reps;Strengthening;15 reps Bar Weights/Barbell (Elbow Extension): 1 lb Forearm Supination: PROM;10 reps;Strengthening;15 reps Forearm Pronation: PROM;10 reps;Strengthening;15 reps Wrist Flexion: PROM;10 reps;Strengthening;15 reps Wrist Extension: PROM;10 reps;Strengthening;15 reps      Manual Therapy Manual Therapy: Myofascial release Myofascial Release: MFR and manual stretching completed to RUE forearm, elbow and upper arm region to decrease fascial restrictions and increase joint mobility in a pain free zone.   Occupational Therapy Assessment and Plan OT Assessment and Plan Clinical Impression Statement: Re-Evaluation completed this session. Pt is progressing well in therapies, meeting 5/5 STG and 3/5 LTG. Pt is progressing well towards remaining 2 LTG. Discussed progress with pt and we will continue 2 more weeks of therapy to further increase pt's abilites to functionally use her RUE. Rehab Potential: Good OT Frequency: Min 2X/week OT Duration: 2 weeks OT Treatment/Interventions: Self-care/ADL training;Therapeutic exercise;Manual therapy;Modalities;Therapeutic activities;Patient/family education OT Plan: P: Increase strengthening weights.   Goals Short Term Goals Short Term Goal 1: Pt will be educated on HEP Short Term Goal 1 Progress: Met Short Term Goal 2: Pt will attain PROM WFL in right elbow to work  towards improved ADL function Short  Term Goal 2 Progress: Met Short Term Goal 3: Pt will have strength of at least 3+/5 in right elbow to work towards improved lifting abilites at work Short Term Goal 3 Progress: Met Short Term Goal 4: Pt will have min edema in right elbow region Short Term Goal 4 Progress: Met Short Term Goal 5: Pt will have min fascial restrictions in bicep and tricep regions Short Term Goal 5 Progress: Met Long Term Goals Long Term Goal 1: Pt will gain highest level of function using RUE in all ADL, IADL, work, and leisure activities. Long Term Goal 1 Progress: Progressing toward goal Long Term Goal 2: Pt will attain AROM WNL in right elbow in order to wash the back of her neck with her right hand Long Term Goal 2 Progress: Progressing toward goal Long Term Goal 3: Pt will have strength of 5/5 in right elbow for improved ability to pick up milk jugs at work Long Term Goal 3 Progress: Met Long Term Goal 4: Pt will have traced edema and fascial restricions in right upper extremity Long Term Goal 4 Progress: Met Long Term Goal 5: Pt will report pain levels of less than 1/10 in 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 Long Term Goal 5 Progress: Met  Problem List Patient Active Problem List   Diagnosis Date Noted  . Decreased range of motion of right elbow 11/19/2013  . Muscle weakness (generalized) 11/19/2013  . Edema 11/19/2013  . Pain in joint, forearm 11/19/2013    End of Session Activity Tolerance: Patient tolerated treatment well General Behavior During Therapy: [facility] for tasks assessed/performed  GO    Bea Graff, MS, OTR/L [phone]  12/20/2013, 10:41 AM  Physician Documentation Your signature is required to indicate approval of the treatment plan as stated above.  Please sign and either send electronically or make a copy of this report for your files and return this physician signed original.  Please mark one 1.__approve of plan  2.  ___approve of plan with the following conditions.   ______________________________                                                          _____________________ Physician Signature                                                                                                             Date

## 2013-12-24 ENCOUNTER — Ambulatory Visit (HOSPITAL_COMMUNITY)
Admission: RE | Admit: 2013-12-24 | Discharge: 2013-12-24 | Disposition: A | Discharge status: Home / Self Care | Payer: Worker's Compensation | Source: Ambulatory Visit | Attending: Family Medicine | Admitting: Family Medicine

## 2013-12-24 DIAGNOSIS — IMO0001 Reserved for inherently not codable concepts without codable children: Secondary | ICD-10-CM | POA: Diagnosis not present

## 2013-12-24 DIAGNOSIS — M25621 Stiffness of right elbow, not elsewhere classified: Secondary | ICD-10-CM

## 2013-12-24 DIAGNOSIS — M25539 Pain in unspecified wrist: Secondary | ICD-10-CM

## 2013-12-24 DIAGNOSIS — R609 Edema, unspecified: Secondary | ICD-10-CM

## 2013-12-24 NOTE — Progress Notes (Signed)
Occupational Therapy Treatment Patient Details  Name: Christy Fletcher MRN: [id number] Date of Birth: [birth year]  Today's Date: 12/24/2013 Time: 4098-11910855-0957 OT Time Calculation (min): 62 min Manual therapy 855-915 20' Therapeutic exercises 920-957 37' Visit#: 11 of 16  Re-eval: 01/03/14    Authorization: [employer] - [employer]  Authorization Time Period:    Authorization Visit#: 11 of    Subjective  S:  I go back to the MD on Thursday.  I am hoping he lets me go to back to work.   Pain Assessment Currently in Pain?: Yes Pain Score: 1  Pain Location: Elbow Pain Orientation: Right Pain Type: Acute pain  Precautions/Restrictions     Exercise/Treatments Elbow Exercises Elbow Flexion: PROM;Strengthening;10 reps Bar Weights/Barbell (Elbow Flexion): 2 lbs Elbow Extension: PROM;Strengthening;10 reps Bar Weights/Barbell (Elbow Extension): 2 lbs Forearm Supination: PROM;Strengthening;10 reps (2#) Forearm Pronation: PROM;Strengthening;10 reps (2#) Wrist Flexion: PROM;Strengthening;10 reps (2#) Wrist Extension: PROM;Strengthening;10 reps (2#)   Wall Pushups/Modified Pushups: 20 Sponges: 17, 17, 18  Theraputty: Flatten;Roll;Grip Theraputty - Flatten: green in standing Theraputty - Roll: green Theraputty - Grip: green with arm supinated and pronated     Manual Therapy Manual Therapy: Myofascial release Myofascial Release: MFR and manual stretching completed to RUE forearm, elbow and upper arm region to decrease fascial restrictions and increase joint mobility in a pain free zone.   Occupational Therapy Assessment and Plan OT Assessment and Plan Clinical Impression Statement: A:  adjusted hand positioning with wall pushups for greater elbow flexion.  increased to 2# with elbow-wrist strengthening in clinic and with HEP. OT Plan: P:  Assess elbow flexion, incerase to 15 repetitions with strengthening activities.    Goals Short Term Goals Short Term Goal 1: Pt will be educated  on HEP Short Term Goal 2: Pt will attain PROM WFL in right elbow to work towards improved ADL function Short Term Goal 4: Pt will have min edema in right elbow region Short Term Goal 5: Pt will have min fascial restrictions in bicep and tricep regions Long Term Goals Long Term Goal 1: Pt will gain highest level of function using RUE in all ADL, IADL, work, and leisure activities. Long Term Goal 1 Progress: Progressing toward goal Long Term Goal 2: Pt will attain AROM WNL in right elbow in order to wash the back of her neck with her right hand Long Term Goal 2 Progress: Progressing toward goal Long Term Goal 4: Pt will have traced edema and fascial restricions in right upper extremity Long Term Goal 5: Pt will report pain levels of less than 1/10 in 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  Problem List Patient Active Problem List   Diagnosis Date Noted  . Decreased range of motion of right elbow 11/19/2013  . Muscle weakness (generalized) 11/19/2013  . Edema 11/19/2013  . Pain in joint, forearm 11/19/2013    End of Session Activity Tolerance: Patient tolerated treatment well General Behavior During Therapy: [facility] for tasks assessed/performed  GO    Shirlean MylarBethany H. Pasqualina Colasurdo, OTR/L  12/24/2013, 10:00 AM

## 2013-12-26 ENCOUNTER — Ambulatory Visit (HOSPITAL_COMMUNITY)
Admission: RE | Admit: 2013-12-26 | Discharge: 2013-12-26 | Disposition: A | Discharge status: Home / Self Care | Payer: Worker's Compensation | Source: Ambulatory Visit | Attending: Family Medicine | Admitting: Family Medicine

## 2013-12-26 DIAGNOSIS — IMO0001 Reserved for inherently not codable concepts without codable children: Secondary | ICD-10-CM | POA: Diagnosis not present

## 2013-12-26 NOTE — Progress Notes (Signed)
Occupational Therapy Treatment Patient Details  Name: Christy Fletcher MRN: [id number] Date of Birth: June 16, 1941  Today's Date: 12/26/2013 Time: 1194-1740 OT Time Calculation (min): 39 min Manual 853-903 (10') Therapeutic Exercises [phone] (48')  Visit#: 12 of 16  Re-eval: 01/03/14    Authorization: [employer] - [employer]  Authorization Time Period:    Authorization Visit#: 12 of    Subjective Symptoms/Limitations Symptoms: I"', Hoping he's gong to let me go back to work!" Pain Assessment Currently in Pain?: No/denies  Precautions/Restrictions     Exercise/Treatments  ROM / Strengthening / Isometric Strengthening UBE (Upper Arm Bike): 2 min forward and back on 1.5   Elbow Exercises Elbow Flexion: PROM;10 reps;Strengthening;15 reps Bar Weights/Barbell (Elbow Flexion): 2 lbs Elbow Extension: PROM;5 reps;Strengthening;15 reps Bar Weights/Barbell (Elbow Extension): 2 lbs Forearm Supination: PROM;10 reps;Strengthening;20 reps (2#) Forearm Pronation: PROM;10 reps;Strengthening;20 reps (2#) Wrist Flexion: PROM;10 reps;Strengthening;20 reps (2#) Wrist Extension: PROM;10 reps;Strengthening;20 reps (2#) Other elbow exercises: in standing - placed and removed 7 each blue and black resisted clothespins on tree - min cues for elboew extension with reaching   Theraputty: Flatten;Roll;Grip Theraputty - Flatten: green in standing Theraputty - Roll: green     Manual Therapy Manual Therapy: Myofascial release Myofascial Release: MFR and manual stretching completed to RUE forearm, elbow and upper arm region to decrease fascial restrictions and increase joint mobility in a pain free zone.    Occupational Therapy Assessment and Plan OT Assessment and Plan Clinical Impression Statement: Pt tolerated well increased reps with 2# weight. increased UBE to 1.5 resistence  - pt verbalized feeling a good stretch. Re-measured elbow flexion /extenion - little change from pervious measurements,  but included changes in print-out of previous letter to take to MD. OT Plan: Follow up on MD apointment. continue 2# weights - increase as able.   Goals Short Term Goals Short Term Goal 1: Pt will be educated on HEP Short Term Goal 1 Progress: Met Short Term Goal 2: Pt will attain PROM WFL in right elbow to work towards improved ADL function Short Term Goal 2 Progress: Met Short Term Goal 3: Pt will have strength of at least 3+/5 in right elbow to work towards improved lifting abilites at work Short Term Goal 3 Progress: Met Short Term Goal 4: Pt will have min edema in right elbow region Short Term Goal 4 Progress: Met Short Term Goal 5: Pt will have min fascial restrictions in bicep and tricep regions Short Term Goal 5 Progress: Met Long Term Goals Long Term Goal 1: Pt will gain highest level of function using RUE in all ADL, IADL, work, and leisure activities. Long Term Goal 1 Progress: Progressing toward goal Long Term Goal 2: Pt will attain AROM WNL in right elbow in order to wash the back of her neck with her right hand Long Term Goal 2 Progress: Progressing toward goal Long Term Goal 3: Pt will have strength of 5/5 in right elbow for improved ability to pick up milk jugs at work Long Term Goal 3 Progress: Met Long Term Goal 4: Pt will have traced edema and fascial restricions in right upper extremity Long Term Goal 4 Progress: Met Long Term Goal 5: Pt will report pain levels of less than 1/10 in 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 Long Term Goal 5 Progress: Met  Problem List Patient Active Problem List   Diagnosis Date Noted  . Decreased range of motion of right elbow 11/19/2013  . Muscle weakness (generalized) 11/19/2013  .  Edema 11/19/2013  . Pain in joint, forearm 11/19/2013    End of Session Activity Tolerance: Patient tolerated treatment well General Behavior During Therapy: [facility] for tasks assessed/performed  GO   Bea Graff, MS, OTR/L [phone]  12/26/2013, 12:04 PM

## 2014-01-01 ENCOUNTER — Ambulatory Visit (HOSPITAL_COMMUNITY)
Admission: RE | Admit: 2014-01-01 | Discharge: 2014-01-01 | Disposition: A | Discharge status: Home / Self Care | Payer: Worker's Compensation | Source: Ambulatory Visit | Attending: Family Medicine | Admitting: Family Medicine

## 2014-01-01 DIAGNOSIS — IMO0001 Reserved for inherently not codable concepts without codable children: Secondary | ICD-10-CM | POA: Diagnosis not present

## 2014-01-01 NOTE — Progress Notes (Signed)
Occupational Therapy Treatment Patient Details  Name: Christy Fletcher MRN: [id number] Date of Birth: 18-Sep-1941  Today's Date: 01/01/2014 Time: 0923-3007 OT Time Calculation (min): 47 min Manual 847-902 (15') Therapeutic Exercises 902-934 (46')  Visit#: 13 of 20  Re-eval: 01/03/14    Authorization: [employer] - [employer]  Authorization Time Period:    Authorization Visit#: 13 of    Subjective Symptoms/Limitations Symptoms: "I saw the PA, not Dr. Noemi Chapel, and they want me to do 4 more weeks of therapy.   she said to go ahead and use they weights you gave me and to keep lifting things at home.  My next appointment is may 7th. I can't start work until then, either." Pain Assessment Currently in Pain?: No/denies  Precautions/Restrictions     Exercise/Treatments  ROM / Strengthening / Isometric Strengthening UBE (Upper Arm Bike): 2 min forard adn back on 2.0 resistence   Elbow Exercises Elbow Flexion: PROM;Strengthening;20 reps (PROM 12) Bar Weights/Barbell (Elbow Flexion): 2 lbs Elbow Extension: PROM;Strengthening;20 reps (PROm 12) Bar Weights/Barbell (Elbow Extension): 2 lbs Forearm Supination: PROM;10 reps;Strengthening;20 reps;Bar weights/barbell (2#) Forearm Pronation: PROM;10 reps;Strengthening;20 reps (2#) Wrist Flexion: PROM;10 reps;Strengthening;20 reps (2#) Wrist Extension: PROM;10 reps;Strengthening;20 reps (2#) Other elbow exercises: in standing - placed 4 4# weights and 2 5# weights into lower shelf of top cabinet, moved to top shelf, back to bottom shelf, and then back to counter top. Cuesing needed for extending elbow completely rather than lifting onto toes.   Theraputty:  (green - push 22 pegs into putty) Theraputty - Flatten: green in standing  Wrist Exercises Forearm Supination: PROM;10 reps;Strengthening;20 reps;Bar weights/barbell (2#) Forearm Pronation: PROM;10 reps;Strengthening;20 reps (2#) Wrist Flexion: PROM;10 reps;Strengthening;20 reps  (2#) Wrist Extension: PROM;10 reps;Strengthening;20 reps (2#)   Theraputty:  (green - push 22 pegs into putty) Theraputty - Flatten: green in standing     Manual Therapy Manual Therapy: Myofascial release Myofascial Release: MFR and manual stretching completed to RUE forearm, elbow and upper arm region to decrease fascial restrictions and increase joint mobility in a pain free zone.     Occupational Therapy Assessment and Plan OT Assessment and Plan Clinical Impression Statement: Pt completed functional lifting tasks with no complaints fo difficulty, just some tiredness and stretching. increased UBE resistence and pt tolerated well.  At previous appointment MD requested 4 more weeks of therapy - through May 7th. OT Plan: Attempt 3# weights for elbow and wrist strengthening.   Goals Short Term Goals Short Term Goal 1: Pt will be educated on HEP Short Term Goal 1 Progress: Met Short Term Goal 2: Pt will attain PROM WFL in right elbow to work towards improved ADL function Short Term Goal 2 Progress: Met Short Term Goal 3: Pt will have strength of at least 3+/5 in right elbow to work towards improved lifting abilites at work Short Term Goal 3 Progress: Met Short Term Goal 4: Pt will have min edema in right elbow region Short Term Goal 4 Progress: Met Short Term Goal 5: Pt will have min fascial restrictions in bicep and tricep regions Short Term Goal 5 Progress: Met Long Term Goals Long Term Goal 1: Pt will gain highest level of function using RUE in all ADL, IADL, work, and leisure activities. Long Term Goal 1 Progress: Progressing toward goal Long Term Goal 2: Pt will attain AROM WNL in right elbow in order to wash the back of her neck with her right hand Long Term Goal 2 Progress: Progressing toward goal Long Term Goal 3: Pt  will have strength of 5/5 in right elbow for improved ability to pick up milk jugs at work Long Term Goal 3 Progress: Met Long Term Goal 4: Pt will have traced  edema and fascial restricions in right upper extremity Long Term Goal 4 Progress: Met Long Term Goal 5: Pt will report pain levels of less than 1/10 in 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 Long Term Goal 5 Progress: Met  Problem List Patient Active Problem List   Diagnosis Date Noted  . Decreased range of motion of right elbow 11/19/2013  . Muscle weakness (generalized) 11/19/2013  . Edema 11/19/2013  . Pain in joint, forearm 11/19/2013    End of Session Activity Tolerance: Patient tolerated treatment well General Behavior During Therapy: WFL for tasks assessed/performed OT Plan of Care OT Home Exercise Plan: Upgraded pt's HEP to green theraputty from red.  [place], MS, OTR/L [phone]  01/01/2014, 12:13 PM

## 2014-01-03 ENCOUNTER — Ambulatory Visit (HOSPITAL_COMMUNITY)
Admission: RE | Admit: 2014-01-03 | Discharge: 2014-01-03 | Disposition: A | Discharge status: Home / Self Care | Payer: Worker's Compensation | Source: Ambulatory Visit | Attending: Family Medicine | Admitting: Family Medicine

## 2014-01-03 DIAGNOSIS — IMO0001 Reserved for inherently not codable concepts without codable children: Secondary | ICD-10-CM | POA: Diagnosis not present

## 2014-01-03 NOTE — Progress Notes (Signed)
Occupational Therapy Treatment Patient Details  Name: Christy Fletcher MRN: [id number] Date of Birth: 1941-06-15  Today's Date: 01/03/2014 Time: 7262-0355 OT Time Calculation (min): 37 min Manual 974-163 (10') Therapeutic Exercises 903-930 (86')  Visit#: 14 of 20  Re-eval: 01/03/14    Authorization: [employer] - [employer]  Authorization Time Period:    Authorization Visit#: 14 of    Subjective Symptoms/Limitations Symptoms: "I'm on my way to church after this." "I hade some pain when I tried to do that three pound. I've tried a half gallon of bleahs, and it was fine. So its comoing along!" Pain Assessment Currently in Pain?: No/denies  Precautions/Restrictions     Exercise/Treatments Elbow Exercises Elbow Flexion: PROM;10 reps;Strengthening (12 reps) Bar Weights/Barbell (Elbow Flexion): 3 lbs Elbow Extension: PROM;10 reps;Strengthening (12 reps) Bar Weights/Barbell (Elbow Extension): 3 lbs Forearm Supination: PROM;10 reps;Strengthening (12, 3#) Forearm Pronation: PROM;10 reps;Strengthening (12 3#) Wrist Flexion: PROM;10 reps;Right (12 3#) Wrist Extension: PROM;10 reps;Strengthening (12 #3)   Wall Pushups/Modified Pushups: 10 at 2 feet, 10 at 2.5 feet away from wall Theraputty:  (green - pushing 20 pegs into putty) Theraputty - Flatten: green in standing Theraputty - Grip: green - pushing dowel into flattened green putty 13 times  Wrist Exercises Forearm Supination: PROM;10 reps;Strengthening (12, 3#) Forearm Pronation: PROM;10 reps;Strengthening (12 3#) Wrist Flexion: PROM;10 reps;Right (12 3#) Wrist Extension: PROM;10 reps;Strengthening (12 #3)   Theraputty:  (green - pushing 20 pegs into putty) Theraputty - Flatten: green in standing Theraputty - Grip: green - pushing dowel into flattened green putty 13 times     Manual Therapy Manual Therapy: Myofascial release Myofascial Release: MFR and manual stretching completed to RUE forearm, elbow and upper arm region  to decrease fascial restrictions and increase joint mobility in a pain free zone.     Occupational Therapy Assessment and Plan OT Assessment and Plan Clinical Impression Statement: Increased weights to 3# - tp tolerated well with only one instance of momentary painful spasm. Pt tolerated well wall push ups and addition of pushing dowel into green putty. OT Plan: continue 3# weights.  Increase resistence on UBE. Functional reaching with wrist weight.     Goals Short Term Goals Short Term Goal 1: Pt will be educated on HEP Short Term Goal 1 Progress: Met Short Term Goal 2: Pt will attain PROM WFL in right elbow to work towards improved ADL function Short Term Goal 2 Progress: Met Short Term Goal 3: Pt will have strength of at least 3+/5 in right elbow to work towards improved lifting abilites at work Short Term Goal 3 Progress: Met Short Term Goal 4: Pt will have min edema in right elbow region Short Term Goal 4 Progress: Met Short Term Goal 5: Pt will have min fascial restrictions in bicep and tricep regions Short Term Goal 5 Progress: Met Long Term Goals Long Term Goal 1: Pt will gain highest level of function using RUE in all ADL, IADL, work, and leisure activities. 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 Long Term Goal 5 Progress: Met  Problem List Patient Active Problem List   Diagnosis Date Noted  . Decreased range of motion of  right elbow 11/19/2013  . Muscle weakness (generalized) 11/19/2013  . Edema 11/19/2013  . Pain in joint, forearm 11/19/2013       Prien, MS, OTR/L [phone]  01/03/2014, 9:39 AM

## 2014-01-07 ENCOUNTER — Ambulatory Visit (HOSPITAL_COMMUNITY)
Admission: RE | Admit: 2014-01-07 | Discharge: 2014-01-07 | Disposition: A | Discharge status: Home / Self Care | Payer: Worker's Compensation | Source: Ambulatory Visit | Attending: Family Medicine | Admitting: Family Medicine

## 2014-01-07 DIAGNOSIS — R609 Edema, unspecified: Secondary | ICD-10-CM

## 2014-01-07 DIAGNOSIS — M25621 Stiffness of right elbow, not elsewhere classified: Secondary | ICD-10-CM

## 2014-01-07 DIAGNOSIS — M25539 Pain in unspecified wrist: Secondary | ICD-10-CM

## 2014-01-07 DIAGNOSIS — IMO0001 Reserved for inherently not codable concepts without codable children: Secondary | ICD-10-CM | POA: Diagnosis not present

## 2014-01-07 NOTE — Progress Notes (Signed)
Occupational Therapy Treatment Patient Details  Name: Christy Fletcher MRN: [id number] Date of Birth: 07/19/1941  Today's Date: 01/07/2014 Time: 4098-11910853-0935 OT Time Calculation (min): 42 min Manual therapy 853-913 20' Therapeutic exercises [id number] 22'  Visit#: 15 of 20  Re-eval: 01/17/14    Authorization: [employer] - [employer]  Authorization Time Period:    Authorization Visit#: 15 of    Subjective S:  I cant wash my neck quite like I used to, it still hurts a little bit to do that. Pain Assessment Currently in Pain?: No/denies  Precautions/Restrictions   progress as tolerated  Exercise/Treatments Elbow Exercises Elbow Flexion: PROM;10 reps;Strengthening;15 reps (arms resting on therapy ball for greater contraction) Bar Weights/Barbell (Elbow Flexion): 3 lbs Elbow Extension: PROM;10 reps;Strengthening;15 reps (overhead tricep extension) Bar Weights/Barbell (Elbow Extension): 3 lbs Forearm Supination: PROM;10 reps;Strengthening;15 reps (3 pounds) Forearm Pronation: PROM;10 reps;Strengthening;15 reps (3 pounds) Wrist Flexion: PROM;10 reps;Strengthening;15 reps (3 pounds) Wrist Extension: PROM;10 reps;Strengthening;15 reps (3 pounds) Other elbow exercises: overhead press 3# with mod tactile cuing from OTR/L for positioninging Other elbow exercises: lifted 10# box from 6 inches off of the ground to waist height and then placed back on 6 inch step 10 times with mod-max vg for body positioning   UBE (Upper Arm Bike): 3' and 3' at 3.0 Wall Pushups/Modified Pushups: table pushup 5 times Cybex Leg Press: 1.5 plates 15 times Cybex Row: 1.5 plates 15 times      Manual Therapy Manual Therapy: Myofascial release Myofascial Release: Myofascial release (MFR) and manual stretching completed to RUE forearm, elbow and upper arm region to decrease fascial restrictions and increase joint mobility in a pain free zone.   Occupational Therapy Assessment and Plan OT Assessment and  Plan Clinical Impression Statement: A:  Added work hardening activities in prep for return to work.   OT Plan: P:  Grip strengthening, increase to 4# with bicep, tricep, overhead press, increase repetitions with cybex press and row, attempt rebounder.    Goals Short Term Goals Short Term Goal 1: Pt will be educated on HEP Short Term Goal 2: Pt will attain PROM WFL in right elbow to work towards improved ADL function Short Term Goal 3: Pt will have strength of at least 3+/5 in right elbow to work towards improved lifting abilites at work Short Term Goal 4: Pt will have min edema in right elbow region Short Term Goal 5: Pt will have min fascial restrictions in bicep and tricep regions Long Term Goals Long Term Goal 1: Pt will gain highest level of function using RUE in all ADL, IADL, work, and leisure activities. 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  Problem List Patient Active Problem List   Diagnosis Date Noted  . Decreased range of motion of right elbow 11/19/2013  . Muscle weakness (generalized) 11/19/2013  . Edema 11/19/2013  . Pain in joint, forearm 11/19/2013    End of Session Activity Tolerance: Patient tolerated treatment well General  Behavior During Therapy: [facility] for [facility] assessed/performed  GO    Shirlean MylarBethany H. Jaheim Canino, OTR/L [phone]  01/07/2014, 9:41 AM

## 2014-01-09 ENCOUNTER — Other Ambulatory Visit (HOSPITAL_COMMUNITY): Payer: Self-pay | Admitting: Family Medicine

## 2014-01-09 ENCOUNTER — Ambulatory Visit (HOSPITAL_COMMUNITY)
Admission: RE | Admit: 2014-01-09 | Discharge: 2014-01-09 | Disposition: A | Discharge status: Home / Self Care | Payer: Worker's Compensation | Source: Ambulatory Visit | Attending: Family Medicine | Admitting: Family Medicine

## 2014-01-09 DIAGNOSIS — IMO0001 Reserved for inherently not codable concepts without codable children: Secondary | ICD-10-CM | POA: Diagnosis not present

## 2014-01-09 DIAGNOSIS — M25539 Pain in unspecified wrist: Secondary | ICD-10-CM

## 2014-01-09 DIAGNOSIS — M25621 Stiffness of right elbow, not elsewhere classified: Secondary | ICD-10-CM

## 2014-01-09 DIAGNOSIS — Z1231 Encounter for screening mammogram for malignant neoplasm of breast: Secondary | ICD-10-CM

## 2014-01-09 DIAGNOSIS — R609 Edema, unspecified: Secondary | ICD-10-CM

## 2014-01-09 NOTE — Progress Notes (Signed)
Occupational Therapy Treatment Patient Details  Name: Christy Fletcher MRN: [id number] Date of Birth: 04-01-1941  Today's Date: 01/09/2014 Time: 8295-62130848-0930 OT Time Calculation (min): 42 min MFR 848-900 12' Therex 900-930 30'  Visit#: 16 of 20  Re-eval: 01/17/14    Authorization: [employer]  Authorization Time Period:    Authorization Visit#: 16 of    Subjective Symptoms/Limitations Symptoms: S: I have 3 visits left and then I'll be done. Pain Assessment Currently in Pain?: Yes Pain Score: 2  Pain Location: Elbow Pain Orientation: Right Pain Type: Acute pain  Precautions/Restrictions  Precautions Precautions: None  Exercise/Treatments Elbow Exercises Elbow Flexion: PROM;10 reps;Strengthening;15 reps Bar Weights/Barbell (Elbow Flexion): 4 lbs Elbow Extension: PROM;10 reps;Strengthening;15 reps Bar Weights/Barbell (Elbow Extension): 4 lbs Forearm Supination: PROM;10 reps;Strengthening;15 reps (4#) Forearm Pronation: PROM;10 reps;Strengthening;15 reps (4#) Wrist Flexion: PROM;10 reps;Strengthening;15 reps (4#) Wrist Extension: PROM;10 reps;Strengthening;15 reps (4#) Other elbow exercises: overhead press 4# with mod tactile cuing from OTR/L for positioninging Other elbow exercises: lifted 10# box from 6 inches off of the ground to waist height and then placed back on 6 inch step 10 times with mod-max vg for body positioning   UBE (Upper Arm Bike): 3' and 3' at 3.0 Wall Pushups/Modified Pushups: table pushup 5 times Cybex Leg Press: 1.5 plates 08M25X Cybex Row: 1.5 plates [id number]     Manual Therapy Manual Therapy: Myofascial release Myofascial Release: Myofascial release (MFR) and manual stretching completed to RUE forearm, elbow and upper arm region to decrease fascial restrictions and increase joint mobility in a pain free zone.  Occupational Therapy Assessment and Plan OT Assessment and Plan Clinical Impression Statement: A: Increased weighted exercises to  4#, increased reps with Cybex row/press. patient tolereated well. Unable to complete grip strengthening exercises and rebounder due to time constraint. No fascial restrictions in right elbow region this date.  OT Plan: P:  Grip strengthening. attempt rebounder. D/C MFR.   Goals Short Term Goals Short Term Goal 1: Pt will be educated on HEP Short Term Goal 2: Pt will attain PROM WFL in right elbow to work towards improved ADL function Short Term Goal 3: Pt will have strength of at least 3+/5 in right elbow to work towards improved lifting abilites at work Short Term Goal 4: Pt will have min edema in right elbow region Short Term Goal 5: Pt will have min fascial restrictions in bicep and tricep regions Long Term Goals Long Term Goal 1: Pt will gain highest level of function using RUE in all ADL, IADL, work, and leisure activities. 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  Problem List Patient Active Problem List   Diagnosis Date Noted  . Decreased range of motion of right elbow 11/19/2013  . Muscle weakness (generalized) 11/19/2013  . Edema 11/19/2013  . Pain in joint, forearm 11/19/2013    End of Session Activity Tolerance: Patient tolerated treatment well General Behavior During Therapy: [facility] for tasks assessed/performed  Christy Fletcher, OTR/L,CBIS   01/09/2014, 9:31 AM

## 2014-01-11 ENCOUNTER — Ambulatory Visit (HOSPITAL_COMMUNITY)
Admission: RE | Admit: 2014-01-11 | Discharge: 2014-01-11 | Disposition: A | Discharge status: Home / Self Care | Payer: Medicare HMO | Source: Ambulatory Visit | Attending: Family Medicine | Admitting: Family Medicine

## 2014-01-11 DIAGNOSIS — Z1231 Encounter for screening mammogram for malignant neoplasm of breast: Secondary | ICD-10-CM

## 2014-01-14 ENCOUNTER — Ambulatory Visit (HOSPITAL_COMMUNITY)
Admission: RE | Admit: 2014-01-14 | Discharge: 2014-01-14 | Disposition: A | Discharge status: Home / Self Care | Payer: Worker's Compensation | Source: Ambulatory Visit | Attending: Family Medicine | Admitting: Family Medicine

## 2014-01-14 DIAGNOSIS — M25621 Stiffness of right elbow, not elsewhere classified: Secondary | ICD-10-CM

## 2014-01-14 DIAGNOSIS — R609 Edema, unspecified: Secondary | ICD-10-CM

## 2014-01-14 DIAGNOSIS — M25539 Pain in unspecified wrist: Secondary | ICD-10-CM

## 2014-01-14 DIAGNOSIS — IMO0001 Reserved for inherently not codable concepts without codable children: Secondary | ICD-10-CM | POA: Diagnosis not present

## 2014-01-14 NOTE — Progress Notes (Signed)
Occupational Therapy Treatment Patient Details  Name: Christy MedinMargaret S Yakubov MRN: [id number] Date of Birth: June 30, 1941  Today's Date: 01/14/2014 Time: 8295-62130849-0930 OT Time Calculation (min): 41 min Therex 849-900 41'  Visit#: 17 of 20  Re-eval: 01/22/14    Authorization: [employer] - [employer]  Authorization Time Period:    Authorization Visit#: 17 of    Subjective Symptoms/Limitations Symptoms: S: My elbow is only a little sore today.  Pain Assessment Currently in Pain?: Yes Pain Score: 1  Pain Location: Elbow Pain Orientation: Right Pain Type: Acute pain  Precautions/Restrictions  Precautions Precautions: None  Exercise/Treatments Elbow Exercises Elbow Flexion: PROM;10 reps;Strengthening;20 reps Bar Weights/Barbell (Elbow Flexion): 4 lbs Elbow Extension: PROM;10 reps;Strengthening;20 reps Bar Weights/Barbell (Elbow Extension): 4 lbs Forearm Supination: PROM;10 reps;Strengthening;20 reps (4#) Forearm Pronation: PROM;10 reps;Strengthening;20 reps (4#) Wrist Flexion: PROM;10 reps;Strengthening;20 reps (4#) Wrist Extension: PROM;10 reps;Strengthening;20 reps (4#) Other elbow exercises: overhead press 4# with min tactile cuing from OTR/L for positioninging; 20X   UBE (Upper Arm Bike): 3' and 3' at 3.0 Wall Pushups/Modified Pushups: table pushup 2x5. mod vc's for technique and posture Rebounder: green weighted ball; 20X Cybex Leg Press: 2.0 plates 08M20X Cybex Row: 2.0 plates [id number] Theraputty: Flatten;Roll;Grip Theraputty - Flatten: green in standing Theraputty - Grip: green - pushing dowel into flattened green putty      Manual Therapy Myofascial Release: D/C  Occupational Therapy Assessment and Plan OT Assessment and Plan Clinical Impression Statement: A: Added rebounder and continued with grip strengthening exercises. Needed min vc's for body alignment/technique during tablw push ups. OT Plan: P: Add handgripper with beads   Goals Short Term Goals Short Term Goal 1:  Pt will be educated on HEP Short Term Goal 2: Pt will attain PROM WFL in right elbow to work towards improved ADL function Short Term Goal 3: Pt will have strength of at least 3+/5 in right elbow to work towards improved lifting abilites at work Short Term Goal 4: Pt will have min edema in right elbow region Short Term Goal 5: Pt will have min fascial restrictions in bicep and tricep regions Long Term Goals Long Term Goal 1: Pt will gain highest level of function using RUE in all ADL, IADL, work, and leisure activities. 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  Problem List Patient Active Problem List   Diagnosis Date Noted  . Decreased range of motion of right elbow 11/19/2013  . Muscle weakness (generalized) 11/19/2013  . Edema 11/19/2013  . Pain in joint, forearm 11/19/2013    End of Session Activity Tolerance: Patient tolerated treatment well General Behavior During Therapy: Christy Fletcher for tasks assessed/performed  Christy Fletcher, OTR/L,CBIS   01/14/2014, 9:28 AM

## 2014-01-15 ENCOUNTER — Ambulatory Visit (HOSPITAL_COMMUNITY)
Admission: RE | Admit: 2014-01-15 | Discharge: 2014-01-15 | Disposition: A | Discharge status: Home / Self Care | Payer: Medicare HMO | Source: Ambulatory Visit | Attending: Family Medicine | Admitting: Family Medicine

## 2014-01-15 DIAGNOSIS — Z1231 Encounter for screening mammogram for malignant neoplasm of breast: Secondary | ICD-10-CM | POA: Insufficient documentation

## 2014-01-17 ENCOUNTER — Ambulatory Visit (HOSPITAL_COMMUNITY)
Admission: RE | Admit: 2014-01-17 | Discharge: 2014-01-17 | Disposition: A | Discharge status: Home / Self Care | Payer: Worker's Compensation | Source: Ambulatory Visit | Attending: Family Medicine | Admitting: Family Medicine

## 2014-01-17 DIAGNOSIS — M25621 Stiffness of right elbow, not elsewhere classified: Secondary | ICD-10-CM

## 2014-01-17 DIAGNOSIS — M25539 Pain in unspecified wrist: Secondary | ICD-10-CM

## 2014-01-17 DIAGNOSIS — IMO0001 Reserved for inherently not codable concepts without codable children: Secondary | ICD-10-CM | POA: Diagnosis not present

## 2014-01-17 DIAGNOSIS — R609 Edema, unspecified: Secondary | ICD-10-CM

## 2014-01-17 NOTE — Progress Notes (Signed)
Occupational Therapy Treatment Patient Details  Name: Christy Fletcher MRN: [id number] Date of Birth: [birth year]  Today's Date: 01/17/2014 Time: [id number] OT Time Calculation (min): 45 min Therex [phone] 45'  Visit#: 18 of 20  Re-eval: 01/22/14    Authorization: [employer] - [employer]  Authorization Time Period:    Authorization Visit#: 18 of    Subjective Pain Assessment Currently in Pain?: No/denies  Precautions/Restrictions  Precautions Precautions: None  Exercise/Treatments  01/17/14 0800  Elbow Exercises  Elbow Flexion Strengthening;20 reps  Bar Weights/Barbell (Elbow Flexion) 4 lbs  Elbow Extension Strengthening;20 reps  Bar Weights/Barbell (Elbow Extension) 4 lbs  Forearm Supination Strengthening;20 reps (4#)  Forearm Pronation Strengthening;20 reps (4#)  Wrist Flexion Strengthening;20 reps (4#)  Wrist Extension Strengthening;20 reps (4#)  Other elbow exercises overhead press 4# with min tactile cuing from OTR/L for positioninging; 20X  Other elbow exercises lifted 10# box from 6 inches off of the ground to waist height and then placed back on 6 inch step 10 times with mod-max vg for body positioning  Additional Elbow Exercises  Wall Pushups/Modified Pushups table pushup 2x5. mod vc's for technique and posture  Rebounder green weighted ball; 20X  Cybex Leg Press 1.5 plates [id number]  Cybex Row 2.0 plates [id number]  Theraputty Flatten;Grip  Theraputty - Flatten green in standing  Theraputty - Grip green - pushing dowel into flattened green putty    Occupational Therapy Assessment and Plan OT Assessment and Plan Clinical Impression Statement: A: Due to time constraint was unable to add handgripper with beads. Had to decrease weight on Cybex press due to muscle fatigue. Patient given HEP exercises for wrist with free weight.  OT Plan: P: Reassess and D/C.    Goals Short Term Goals Short Term Goal 1: Pt will be educated on HEP Short Term Goal 2: Pt will attain PROM  WFL in right elbow to work towards improved ADL function Short Term Goal 3: Pt will have strength of at least 3+/5 in right elbow to work towards improved lifting abilites at work Short Term Goal 4: Pt will have min edema in right elbow region Short Term Goal 5: Pt will have min fascial restrictions in bicep and tricep regions Long Term Goals Long Term Goal 1: Pt will gain highest level of function using RUE in all ADL, IADL, work, and leisure activities. Long Term Goal 2: Pt will attain AROM WNL in right elbow in order to wash the back of her neck with her right hand Long Term Goal 3: Pt will have strength of 5/5 in right elbow for improved ability to pick up milk jugs at work Long Term Goal 4: Pt will have traced edema and fascial restricions in right upper extremity Long Term Goal 5: Pt will report pain levels of less than 1/10 in 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  Problem List Patient Active Problem List   Diagnosis Date Noted  . Decreased range of motion of right elbow 11/19/2013  . Muscle weakness (generalized) 11/19/2013  . Edema 11/19/2013  . Pain in joint, forearm 11/19/2013    End of Session Activity Tolerance: Patient tolerated treatment well General Behavior During Therapy: [facility] tasks assessed/performed OT Plan of Care OT Home Exercise Plan: Wrist strengthening exercises with free weight OT Patient Instructions: Handout (scanned) Consulted and Agree with Plan of Care: Patient   Limmie PatriciaLaura July Nickson, OTR/L,CBIS   01/17/2014, 9:46 AM

## 2014-01-22 ENCOUNTER — Ambulatory Visit (HOSPITAL_COMMUNITY)
Admission: RE | Admit: 2014-01-22 | Discharge: 2014-01-22 | Disposition: A | Discharge status: Home / Self Care | Payer: Worker's Compensation | Source: Ambulatory Visit | Attending: Family Medicine | Admitting: Family Medicine

## 2014-01-22 DIAGNOSIS — M25629 Stiffness of unspecified elbow, not elsewhere classified: Secondary | ICD-10-CM | POA: Diagnosis not present

## 2014-01-22 DIAGNOSIS — R609 Edema, unspecified: Secondary | ICD-10-CM

## 2014-01-22 DIAGNOSIS — M6281 Muscle weakness (generalized): Secondary | ICD-10-CM | POA: Diagnosis not present

## 2014-01-22 DIAGNOSIS — IMO0001 Reserved for inherently not codable concepts without codable children: Secondary | ICD-10-CM | POA: Insufficient documentation

## 2014-01-22 DIAGNOSIS — M25621 Stiffness of right elbow, not elsewhere classified: Secondary | ICD-10-CM

## 2014-01-22 DIAGNOSIS — M25429 Effusion, unspecified elbow: Secondary | ICD-10-CM | POA: Diagnosis not present

## 2014-01-22 DIAGNOSIS — M25529 Pain in unspecified elbow: Secondary | ICD-10-CM | POA: Insufficient documentation

## 2014-01-22 DIAGNOSIS — M25539 Pain in unspecified wrist: Secondary | ICD-10-CM

## 2014-01-22 NOTE — Evaluation (Signed)
Occupational Therapy Re-Evaluation  Patient Details  Name: Christy Fletcher MRN: [id number] Date of Birth: Aug 12, 1941  Today's Date: 01/22/2014 Time: 7619-5093 OT Time Calculation (min): 28 min ROM/MMT 32' Visit#: 19 of 20  Re-eval: 01/22/14  Assessment Diagnosis: right Elbow radial head fracture   Past Medical History:  Past Medical History  Diagnosis Date  . Hypercholesteremia   . Arthritis   . Osteopenia   . Diarrhea    Past Surgical History:  Past Surgical History  Procedure Laterality Date  . Abdominal hysterectomy    . Cataract extraction    . Colonoscopy    . Colonoscopy N/A 05/14/2013    Procedure: COLONOSCOPY;  Surgeon: Danie Binder, MD;  Location: [facility];  Service: Endoscopy;  Laterality: N/A;  10:30 AM-rescheduled to 8:45am Doris notified pt    Subjective  S:  I am ready to discharge and go back to work.  I feel better! Special Tests: FOTO 84%  Pain Assessment Currently in Pain?: No/denies Pain Score: 0-No pain  Assessment ADL/Vision/Perception ADL ADL Comments: Patient reports return to prior level of independence with all activities and is at 98%    Sensation/Coordination/Edema Coordination 9 Hole Peg Test: 17.10" ((19.62") )  Additional Assessments RUE AROM (degrees) RUE Overall AROM Comments: Assessed in sitting (12/20/13) Right Elbow Flexion: 142 (143) Right Elbow Extension:  (+9 (+10)) Right Forearm Pronation: 85 Degrees ((84)) Right Forearm Supination: 90 Degrees ((90)) Right Wrist Extension: 68 Degrees ((58)) Right Wrist Flexion: 76 Degrees ((74)) RUE Strength Right Elbow Flexion: 5/5 (5/5) Right Elbow Extension: 5/5 (5/5) Right Forearm Pronation: 5/5 (5/5) Right Forearm Supination: 5/5 (5/5) Right Wrist Flexion: 5/5 (5/5) Right Wrist Extension: 5/5 (5/5) Grip (lbs): 54 ((42))     Occupational Therapy Assessment and Plan OT Assessment and Plan Clinical Impression Statement: A:  Reassessment completed this date.  Patient  independent with all B/IADLS, leisure activities, and feels ready to return to work.  OT Plan: P:  DC from skilled OT intervention this date.    Goals Short Term Goals Short Term Goal 1: Pt will be educated on HEP Short Term Goal 2: Pt will attain PROM WFL in right elbow to work towards improved ADL function Short Term Goal 2 Progress: Met Short Term Goal 3: Pt will have strength of at least 3+/5 in right elbow to work towards improved lifting abilites at work Short Term Goal 3 Progress: Met Short Term Goal 4: Pt will have min edema in right elbow region Short Term Goal 4 Progress: Met Short Term Goal 5: Pt will have min fascial restrictions in bicep and tricep regions Short Term Goal 5 Progress: Met Long Term Goals Long Term Goal 1: Pt will gain highest level of function using RUE in all ADL, IADL, work, and leisure activities. Long Term Goal 1 Progress: Met Long Term Goal 2: Pt will attain AROM WNL in right elbow in order to wash the back of her neck with her right hand Long Term Goal 2 Progress: Met Long Term Goal 3: Pt will have strength of 5/5 in right elbow for improved ability to pick up milk jugs at work Long Term Goal 3 Progress: Met Long Term Goal 4: Pt will have traced edema and fascial restricions in right upper extremity Long Term Goal 4 Progress: Met Long Term Goal 5: Pt will report pain levels of less than 1/10 in 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 Long Term Goal 5 Progress: Met  Problem List Patient Active Problem List  Diagnosis Date Noted  . Decreased range of motion of right elbow 11/19/2013  . Muscle weakness (generalized) 11/19/2013  . Edema 11/19/2013  . Pain in joint, forearm 11/19/2013    End of Session Activity Tolerance: Patient tolerated treatment well General Behavior During Therapy: [facility] for tasks assessed/performed  [place], OTR/L [phone]  01/22/2014, 9:16 AM  Physician Documentation Your signature is  required to indicate approval of the treatment plan as stated above.  Please sign and either send electronically or make a copy of this report for your files and return this physician signed original.  Please mark one 1.__approve of plan  2. ___approve of plan with the following conditions.   ______________________________                                                          _____________________ Physician Signature                                                                                                             Date

## 2015-01-29 ENCOUNTER — Other Ambulatory Visit (HOSPITAL_COMMUNITY): Payer: Self-pay | Admitting: Family Medicine

## 2015-01-29 DIAGNOSIS — Z1231 Encounter for screening mammogram for malignant neoplasm of breast: Secondary | ICD-10-CM

## 2015-02-24 ENCOUNTER — Ambulatory Visit (HOSPITAL_COMMUNITY)
Admission: RE | Admit: 2015-02-24 | Discharge: 2015-02-24 | Disposition: A | Discharge status: Home / Self Care | Payer: PPO | Source: Ambulatory Visit | Attending: Family Medicine | Admitting: Family Medicine

## 2015-02-24 DIAGNOSIS — Z1231 Encounter for screening mammogram for malignant neoplasm of breast: Secondary | ICD-10-CM | POA: Diagnosis not present

## 2015-10-20 DIAGNOSIS — L309 Dermatitis, unspecified: Secondary | ICD-10-CM | POA: Diagnosis not present

## 2015-10-29 DIAGNOSIS — R600 Localized edema: Secondary | ICD-10-CM | POA: Diagnosis not present

## 2015-10-29 DIAGNOSIS — M25561 Pain in right knee: Secondary | ICD-10-CM | POA: Diagnosis not present

## 2015-10-29 DIAGNOSIS — R2681 Unsteadiness on feet: Secondary | ICD-10-CM | POA: Diagnosis not present

## 2015-10-29 DIAGNOSIS — Z96651 Presence of right artificial knee joint: Secondary | ICD-10-CM | POA: Diagnosis not present

## 2015-10-29 DIAGNOSIS — M6281 Muscle weakness (generalized): Secondary | ICD-10-CM | POA: Diagnosis not present

## 2015-10-29 DIAGNOSIS — R261 Paralytic gait: Secondary | ICD-10-CM | POA: Diagnosis not present

## 2016-01-05 DIAGNOSIS — J00 Acute nasopharyngitis [common cold]: Secondary | ICD-10-CM | POA: Diagnosis not present

## 2016-01-05 DIAGNOSIS — J069 Acute upper respiratory infection, unspecified: Secondary | ICD-10-CM | POA: Diagnosis not present

## 2016-02-02 DIAGNOSIS — M170 Bilateral primary osteoarthritis of knee: Secondary | ICD-10-CM | POA: Diagnosis not present

## 2016-02-02 DIAGNOSIS — E7800 Pure hypercholesterolemia, unspecified: Secondary | ICD-10-CM | POA: Diagnosis not present

## 2016-02-02 DIAGNOSIS — Z0000 Encounter for general adult medical examination without abnormal findings: Secondary | ICD-10-CM | POA: Diagnosis not present

## 2016-02-02 DIAGNOSIS — M8580 Other specified disorders of bone density and structure, unspecified site: Secondary | ICD-10-CM | POA: Diagnosis not present

## 2016-04-15 ENCOUNTER — Other Ambulatory Visit (HOSPITAL_COMMUNITY): Payer: Self-pay | Admitting: Family Medicine

## 2016-04-15 DIAGNOSIS — Z1231 Encounter for screening mammogram for malignant neoplasm of breast: Secondary | ICD-10-CM

## 2016-04-21 ENCOUNTER — Ambulatory Visit (HOSPITAL_COMMUNITY): Payer: Self-pay

## 2016-04-28 ENCOUNTER — Ambulatory Visit (HOSPITAL_COMMUNITY)
Admission: RE | Admit: 2016-04-28 | Discharge: 2016-04-28 | Disposition: A | Discharge status: Home / Self Care | Payer: PPO | Source: Ambulatory Visit | Attending: Family Medicine | Admitting: Family Medicine

## 2016-04-28 DIAGNOSIS — Z1231 Encounter for screening mammogram for malignant neoplasm of breast: Secondary | ICD-10-CM | POA: Diagnosis not present

## 2016-07-05 DIAGNOSIS — S20462A Insect bite (nonvenomous) of left back wall of thorax, initial encounter: Secondary | ICD-10-CM | POA: Diagnosis not present

## 2016-07-07 DIAGNOSIS — Z23 Encounter for immunization: Secondary | ICD-10-CM | POA: Diagnosis not present

## 2016-10-11 ENCOUNTER — Ambulatory Visit (INDEPENDENT_AMBULATORY_CARE_PROVIDER_SITE_OTHER): Payer: PPO | Admitting: Otolaryngology

## 2016-10-11 DIAGNOSIS — H6123 Impacted cerumen, bilateral: Secondary | ICD-10-CM

## 2016-10-11 DIAGNOSIS — H903 Sensorineural hearing loss, bilateral: Secondary | ICD-10-CM | POA: Diagnosis not present

## 2016-10-11 DIAGNOSIS — H900 Conductive hearing loss, bilateral: Secondary | ICD-10-CM | POA: Diagnosis not present

## 2016-11-22 DIAGNOSIS — M79672 Pain in left foot: Secondary | ICD-10-CM | POA: Diagnosis not present

## 2016-11-22 DIAGNOSIS — M25579 Pain in unspecified ankle and joints of unspecified foot: Secondary | ICD-10-CM | POA: Diagnosis not present

## 2016-11-22 DIAGNOSIS — M2042 Other hammer toe(s) (acquired), left foot: Secondary | ICD-10-CM | POA: Diagnosis not present

## 2017-01-26 ENCOUNTER — Other Ambulatory Visit (HOSPITAL_COMMUNITY): Payer: Self-pay | Admitting: Family Medicine

## 2017-01-26 DIAGNOSIS — M8580 Other specified disorders of bone density and structure, unspecified site: Secondary | ICD-10-CM | POA: Diagnosis not present

## 2017-01-26 DIAGNOSIS — E7800 Pure hypercholesterolemia, unspecified: Secondary | ICD-10-CM | POA: Diagnosis not present

## 2017-01-26 DIAGNOSIS — Z780 Asymptomatic menopausal state: Secondary | ICD-10-CM

## 2017-01-26 DIAGNOSIS — M2141 Flat foot [pes planus] (acquired), right foot: Secondary | ICD-10-CM | POA: Diagnosis not present

## 2017-01-26 DIAGNOSIS — Z0001 Encounter for general adult medical examination with abnormal findings: Secondary | ICD-10-CM | POA: Diagnosis not present

## 2017-02-02 DIAGNOSIS — Z0000 Encounter for general adult medical examination without abnormal findings: Secondary | ICD-10-CM | POA: Diagnosis not present

## 2017-02-02 DIAGNOSIS — M8580 Other specified disorders of bone density and structure, unspecified site: Secondary | ICD-10-CM | POA: Diagnosis not present

## 2017-02-02 DIAGNOSIS — M2141 Flat foot [pes planus] (acquired), right foot: Secondary | ICD-10-CM | POA: Diagnosis not present

## 2017-02-02 DIAGNOSIS — E7800 Pure hypercholesterolemia, unspecified: Secondary | ICD-10-CM | POA: Diagnosis not present

## 2017-02-03 ENCOUNTER — Ambulatory Visit (HOSPITAL_COMMUNITY)
Admission: RE | Admit: 2017-02-03 | Discharge: 2017-02-03 | Disposition: A | Discharge status: Home / Self Care | Payer: PPO | Source: Ambulatory Visit | Attending: Family Medicine | Admitting: Family Medicine

## 2017-02-03 DIAGNOSIS — M8588 Other specified disorders of bone density and structure, other site: Secondary | ICD-10-CM | POA: Insufficient documentation

## 2017-02-03 DIAGNOSIS — M8580 Other specified disorders of bone density and structure, unspecified site: Secondary | ICD-10-CM

## 2017-02-03 DIAGNOSIS — Z780 Asymptomatic menopausal state: Secondary | ICD-10-CM | POA: Diagnosis not present

## 2017-02-03 DIAGNOSIS — M85851 Other specified disorders of bone density and structure, right thigh: Secondary | ICD-10-CM | POA: Diagnosis not present

## 2017-02-10 DIAGNOSIS — H25811 Combined forms of age-related cataract, right eye: Secondary | ICD-10-CM | POA: Diagnosis not present

## 2017-02-10 DIAGNOSIS — H02402 Unspecified ptosis of left eyelid: Secondary | ICD-10-CM | POA: Diagnosis not present

## 2017-02-10 DIAGNOSIS — H31091 Other chorioretinal scars, right eye: Secondary | ICD-10-CM | POA: Diagnosis not present

## 2017-02-10 DIAGNOSIS — H00021 Hordeolum internum right upper eyelid: Secondary | ICD-10-CM | POA: Diagnosis not present

## 2017-02-10 DIAGNOSIS — Z961 Presence of intraocular lens: Secondary | ICD-10-CM | POA: Diagnosis not present

## 2017-06-13 ENCOUNTER — Other Ambulatory Visit (HOSPITAL_COMMUNITY): Payer: Self-pay | Admitting: Family Medicine

## 2017-06-13 DIAGNOSIS — Z1231 Encounter for screening mammogram for malignant neoplasm of breast: Secondary | ICD-10-CM

## 2017-06-17 ENCOUNTER — Ambulatory Visit (HOSPITAL_COMMUNITY)
Admission: RE | Admit: 2017-06-17 | Discharge: 2017-06-17 | Disposition: A | Discharge status: Home / Self Care | Payer: PPO | Source: Ambulatory Visit | Attending: Family Medicine | Admitting: Family Medicine

## 2017-06-17 DIAGNOSIS — Z1231 Encounter for screening mammogram for malignant neoplasm of breast: Secondary | ICD-10-CM | POA: Insufficient documentation

## 2017-07-12 DIAGNOSIS — Z23 Encounter for immunization: Secondary | ICD-10-CM | POA: Diagnosis not present

## 2017-10-17 ENCOUNTER — Ambulatory Visit (INDEPENDENT_AMBULATORY_CARE_PROVIDER_SITE_OTHER): Payer: PPO | Admitting: Otolaryngology

## 2017-10-17 DIAGNOSIS — H903 Sensorineural hearing loss, bilateral: Secondary | ICD-10-CM

## 2017-10-17 DIAGNOSIS — H6123 Impacted cerumen, bilateral: Secondary | ICD-10-CM

## 2017-11-19 ENCOUNTER — Other Ambulatory Visit: Payer: Self-pay

## 2017-11-19 ENCOUNTER — Inpatient Hospital Stay (HOSPITAL_COMMUNITY)
Admission: EM | Admit: 2017-11-19 | Discharge: 2017-11-24 | DRG: 469 | Disposition: A | Discharge status: Home / Self Care | Payer: Medicare HMO | Attending: Internal Medicine | Admitting: Internal Medicine

## 2017-11-19 ENCOUNTER — Encounter (HOSPITAL_COMMUNITY): Payer: Self-pay

## 2017-11-19 ENCOUNTER — Emergency Department (HOSPITAL_COMMUNITY): Payer: Medicare HMO

## 2017-11-19 DIAGNOSIS — M1990 Unspecified osteoarthritis, unspecified site: Secondary | ICD-10-CM | POA: Diagnosis present

## 2017-11-19 DIAGNOSIS — Q211 Atrial septal defect: Secondary | ICD-10-CM

## 2017-11-19 DIAGNOSIS — I253 Aneurysm of heart: Secondary | ICD-10-CM | POA: Diagnosis present

## 2017-11-19 DIAGNOSIS — I4891 Unspecified atrial fibrillation: Secondary | ICD-10-CM | POA: Diagnosis not present

## 2017-11-19 DIAGNOSIS — I639 Cerebral infarction, unspecified: Secondary | ICD-10-CM | POA: Diagnosis not present

## 2017-11-19 DIAGNOSIS — I6340 Cerebral infarction due to embolism of unspecified cerebral artery: Secondary | ICD-10-CM

## 2017-11-19 DIAGNOSIS — S72001A Fracture of unspecified part of neck of right femur, initial encounter for closed fracture: Secondary | ICD-10-CM | POA: Diagnosis present

## 2017-11-19 DIAGNOSIS — I6310 Cerebral infarction due to embolism of unspecified precerebral artery: Secondary | ICD-10-CM

## 2017-11-19 DIAGNOSIS — Y92009 Unspecified place in unspecified non-institutional (private) residence as the place of occurrence of the external cause: Secondary | ICD-10-CM

## 2017-11-19 DIAGNOSIS — I361 Nonrheumatic tricuspid (valve) insufficiency: Secondary | ICD-10-CM | POA: Diagnosis not present

## 2017-11-19 DIAGNOSIS — Z96649 Presence of unspecified artificial hip joint: Secondary | ICD-10-CM

## 2017-11-19 DIAGNOSIS — Y9301 Activity, walking, marching and hiking: Secondary | ICD-10-CM | POA: Diagnosis present

## 2017-11-19 DIAGNOSIS — S72001D Fracture of unspecified part of neck of right femur, subsequent encounter for closed fracture with routine healing: Secondary | ICD-10-CM | POA: Diagnosis not present

## 2017-11-19 DIAGNOSIS — M8580 Other specified disorders of bone density and structure, unspecified site: Secondary | ICD-10-CM

## 2017-11-19 DIAGNOSIS — Z79899 Other long term (current) drug therapy: Secondary | ICD-10-CM | POA: Diagnosis not present

## 2017-11-19 DIAGNOSIS — R29700 NIHSS score 0: Secondary | ICD-10-CM | POA: Diagnosis not present

## 2017-11-19 DIAGNOSIS — Z01810 Encounter for preprocedural cardiovascular examination: Secondary | ICD-10-CM | POA: Diagnosis not present

## 2017-11-19 DIAGNOSIS — I6389 Other cerebral infarction: Secondary | ICD-10-CM | POA: Diagnosis not present

## 2017-11-19 DIAGNOSIS — R4701 Aphasia: Secondary | ICD-10-CM | POA: Diagnosis not present

## 2017-11-19 DIAGNOSIS — W19XXXA Unspecified fall, initial encounter: Secondary | ICD-10-CM

## 2017-11-19 DIAGNOSIS — W010XXA Fall on same level from slipping, tripping and stumbling without subsequent striking against object, initial encounter: Secondary | ICD-10-CM | POA: Diagnosis present

## 2017-11-19 DIAGNOSIS — E785 Hyperlipidemia, unspecified: Secondary | ICD-10-CM | POA: Diagnosis present

## 2017-11-19 DIAGNOSIS — S72009A Fracture of unspecified part of neck of unspecified femur, initial encounter for closed fracture: Secondary | ICD-10-CM

## 2017-11-19 LAB — BASIC METABOLIC PANEL
Anion gap: 9 (ref 5–15)
BUN: 15 mg/dL (ref 6–20)
CALCIUM: 9.1 mg/dL (ref 8.9–10.3)
CO2: 26 mmol/L (ref 22–32)
Chloride: 105 mmol/L (ref 101–111)
Creatinine, Ser: 0.67 mg/dL (ref 0.44–1.00)
GFR calc Af Amer: >60 mL/min (ref >60)
GLUCOSE: 104 mg/dL — AB (ref 65–99)
POTASSIUM: 3.9 mmol/L (ref 3.5–5.1)
Sodium: 140 mmol/L (ref 135–145)

## 2017-11-19 LAB — CBC WITH DIFFERENTIAL/PLATELET
BASOS ABS: 0 10*3/uL (ref 0.0–0.1)
Basophils Relative: 0 %
EOS PCT: 1 %
Eosinophils Absolute: 0.1 10*3/uL (ref 0.0–0.7)
HCT: 39.5 % (ref 36.0–46.0)
Hemoglobin: 12.9 g/dL (ref 12.0–15.0)
LYMPHS ABS: 1 10*3/uL (ref 0.7–4.0)
Lymphocytes Relative: 10 %
MCH: 29.5 pg (ref 26.0–34.0)
MCHC: 32.7 g/dL (ref 30.0–36.0)
MCV: 90.4 fL (ref 78.0–100.0)
MONO ABS: 0.6 10*3/uL (ref 0.1–1.0)
Monocytes Relative: 6 %
Neutro Abs: 7.9 10*3/uL — ABNORMAL HIGH (ref 1.7–7.7)
Neutrophils Relative %: 83 %
PLATELETS: 220 10*3/uL (ref 150–400)
RBC: 4.37 MIL/uL (ref 3.87–5.11)
RDW: 13.3 % (ref 11.5–15.5)
WBC: 9.5 10*3/uL (ref 4.0–10.5)

## 2017-11-19 MED ORDER — LACTATED RINGERS IV SOLN
INTRAVENOUS | Status: DC
  Administered 2017-11-19: 21:00:00 via INTRAVENOUS

## 2017-11-19 MED ORDER — CALCIUM CARBONATE 600 MG PO TABS: 1200.0000 mg | ORAL_TABLET | Freq: Every day | ORAL | Status: DC

## 2017-11-19 MED ORDER — ONDANSETRON HCL 4 MG PO TABS: 4.0000 mg | ORAL_TABLET | Freq: Four times a day (QID) | ORAL | Status: DC | PRN

## 2017-11-19 MED ORDER — POVIDONE-IODINE 10 % EX SWAB
2.0000 "application " | Freq: Once | CUTANEOUS | Status: AC
  Administered 2017-11-20: 2 via TOPICAL

## 2017-11-19 MED ORDER — ONDANSETRON HCL 4 MG/2ML IJ SOLN
4.0000 mg | Freq: Once | INTRAMUSCULAR | Status: AC
  Administered 2017-11-19: 4 mg via INTRAVENOUS
  Filled 2017-11-19: qty 2

## 2017-11-19 MED ORDER — ENOXAPARIN SODIUM 40 MG/0.4ML ~~LOC~~ SOLN: 40.0000 mg | SUBCUTANEOUS | Status: DC

## 2017-11-19 MED ORDER — FENTANYL CITRATE (PF) 100 MCG/2ML IJ SOLN
50.0000 ug | Freq: Once | INTRAMUSCULAR | Status: AC
  Administered 2017-11-19: 50 ug via INTRAVENOUS
  Filled 2017-11-19: qty 2

## 2017-11-19 MED ORDER — ONDANSETRON HCL 4 MG/2ML IJ SOLN
4.0000 mg | Freq: Four times a day (QID) | INTRAMUSCULAR | Status: DC | PRN
  Administered 2017-11-20: 4 mg via INTRAVENOUS

## 2017-11-19 MED ORDER — CHLORHEXIDINE GLUCONATE 4 % EX LIQD
60.0000 mL | Freq: Once | CUTANEOUS | Status: AC
  Administered 2017-11-20: 4 via TOPICAL

## 2017-11-19 MED ORDER — MORPHINE SULFATE (PF) 2 MG/ML IV SOLN
2.0000 mg | INTRAVENOUS | Status: DC | PRN
  Administered 2017-11-20: 2 mg via INTRAVENOUS
  Filled 2017-11-19: qty 1

## 2017-11-19 MED ORDER — SODIUM CHLORIDE 0.9 % IV BOLUS (SEPSIS)
500.0000 mL | Freq: Once | INTRAVENOUS | Status: AC
  Administered 2017-11-19: 500 mL via INTRAVENOUS

## 2017-11-19 MED ORDER — PRAVASTATIN SODIUM 20 MG PO TABS
20.0000 mg | ORAL_TABLET | Freq: Every day | ORAL | Status: DC
  Administered 2017-11-20 – 2017-11-23 (×4): 20 mg via ORAL
  Filled 2017-11-19 (×5): qty 1

## 2017-11-19 MED ORDER — CEFAZOLIN SODIUM-DEXTROSE 2-4 GM/100ML-% IV SOLN
2.0000 g | INTRAVENOUS | Status: AC
  Administered 2017-11-20: 2 g via INTRAVENOUS

## 2017-11-19 MED ORDER — ACETAMINOPHEN 325 MG PO TABS: 650.0000 mg | ORAL_TABLET | Freq: Four times a day (QID) | ORAL | Status: DC | PRN

## 2017-11-19 MED ORDER — KETOROLAC TROMETHAMINE 15 MG/ML IJ SOLN
15.0000 mg | Freq: Four times a day (QID) | INTRAMUSCULAR | Status: DC | PRN
  Administered 2017-11-19: 15 mg via INTRAVENOUS
  Filled 2017-11-19 (×2): qty 1

## 2017-11-19 MED ORDER — ACETAMINOPHEN 650 MG RE SUPP: 650.0000 mg | Freq: Four times a day (QID) | RECTAL | Status: DC | PRN

## 2017-11-19 MED ORDER — MORPHINE SULFATE (PF) 4 MG/ML IV SOLN
4.0000 mg | Freq: Once | INTRAVENOUS | Status: AC
  Administered 2017-11-19: 4 mg via INTRAVENOUS
  Filled 2017-11-19: qty 1

## 2017-11-19 MED ORDER — CALCIUM CARBONATE 1250 (500 CA) MG PO TABS
1.0000 | ORAL_TABLET | Freq: Every day | ORAL | Status: DC
  Administered 2017-11-21 – 2017-11-23 (×3): 500 mg via ORAL
  Filled 2017-11-19 (×6): qty 1

## 2017-11-19 NOTE — Progress Notes (Signed)
[facility].

## 2017-11-19 NOTE — ED Provider Notes (Signed)
[facility] EMERGENCY DEPARTMENT Provider Note   CSN: [id number] Arrival date & time: 11/19/17  1708     History   Chief Complaint Chief Complaint  Patient presents with  . Fall    HPI Burnis MedinMargaret S Neff is a 77 y.o. female.  Accidental trip and fall a brief time ago on a hardwood floor at home.  Complains of right lateral hip pain.  No head or neck trauma.  No other obvious injury.  She is normally quite healthy.  Severity of pain is moderate.  Palpation and positioning make pain worse.      Past Medical History:  Diagnosis Date  . Arthritis   . Diarrhea   . Hypercholesteremia   . Osteopenia     Patient Active Problem List   Diagnosis Date Noted  . Decreased range of motion of right elbow 11/19/2013  . Muscle weakness (generalized) 11/19/2013  . Edema 11/19/2013  . Pain in joint, forearm 11/19/2013    Past Surgical History:  Procedure Laterality Date  . ABDOMINAL HYSTERECTOMY    . CATARACT EXTRACTION    . COLONOSCOPY    . COLONOSCOPY N/A 05/14/2013   Procedure: COLONOSCOPY;  Surgeon: West BaliSandi L Fields, MD;  [facility]: [facility];  Service: Endoscopy;  Laterality: N/A;  10:30 AM-rescheduled to 8:45am Doris notified pt  . HERNIA REPAIR      OB History    No data available       Home Medications    Prior to Admission medications   Medication Sig Start Date End Date Taking? Authorizing Provider  calcium carbonate (OS-CAL) 600 MG TABS tablet Take 1,200 mg by mouth daily.   Yes [provider]  lovastatin (MEVACOR) 20 MG tablet Take 20 mg by mouth at bedtime.   Yes [provider]    Family History Family History  Problem Relation Age of Onset  . Colon cancer Neg Hx     Social History Social History   Tobacco Use  . Smoking status: Never Smoker  . Smokeless tobacco: Never Used  Substance Use Topics  . Alcohol use: No  . Drug use: No     Allergies   Patient has no known allergies.   Review of Systems Review of Systems    All other systems reviewed and are negative.    Physical Exam Updated Vital Signs BP 127/64   Pulse 80   Temp 97.8 F (36.6 C) (Oral)   Resp 18   Ht 5\' 1"  (1.549 m)   Wt 62.6 kg (138 lb)   SpO2 96%   BMI 26.07 kg/m   Physical Exam  Constitutional: She is oriented to person, place, and time. She appears well-developed and well-nourished.  HENT:  Head: Normocephalic and atraumatic.  Eyes: Conjunctivae are normal.  Neck: Neck supple.  Cardiovascular: Normal rate and regular rhythm.  Pulmonary/Chest: Effort normal and breath sounds normal.  Abdominal: Soft. Bowel sounds are normal.  Musculoskeletal:  Right hip: Tender in inguinal area of hip.  Pain with range of motion.  Neurological: She is alert and oriented to person, place, and time.  Skin: Skin is warm and dry.  Psychiatric: She has a normal mood and affect. Her behavior is normal.  Nursing note and vitals reviewed.    ED Treatments / Results  Labs (all labs ordered are listed, but only abnormal results are displayed) Labs Reviewed  CBC WITH DIFFERENTIAL/PLATELET - Abnormal; Notable for the following components:      Result Value   Neutro Abs  7.9 (*)    All other components within normal limits  BASIC METABOLIC PANEL - Abnormal; Notable for the following components:   Glucose, Bld 104 (*)    All other components within normal limits  CBC WITH DIFFERENTIAL/PLATELET    EKG  EKG Interpretation None       Radiology Dg Chest 1 View  Result Date: 11/19/2017 CLINICAL DATA:  Fall. EXAM: CHEST 1 VIEW COMPARISON:  June 26, 2010 FINDINGS: Prominence of the mediastinum is probably due to the portable technique. A PA and lateral chest x-ray could further evaluate if clinically warranted. The heart, hila, and pleura are normal. No pneumothorax. No pulmonary nodules, masses, or focal infiltrates. IMPRESSION: Mild prominence of mediastinum is likely due to portable technique. No other abnormalities. Electronically  Signed   By: Gerome Sam III M.D   On: 11/19/2017 18:40   Dg Hip Unilat W Or Wo Pelvis 2-3 Views Right  Result Date: 11/19/2017 CLINICAL DATA:  Pain after fall EXAM: DG HIP (WITH OR WITHOUT PELVIS) 2-3V RIGHT COMPARISON:  None. FINDINGS: There is a fracture through the right femoral neck without femoral head dislocation. No other acute abnormalities identified. IMPRESSION: Right femoral neck fracture. Electronically Signed   By: Gerome Sam III M.D   On: 11/19/2017 18:39    Procedures Procedures (including critical care time)  Medications Ordered in ED Medications  fentaNYL (SUBLIMAZE) injection 50 mcg (not administered)  sodium chloride 0.9 % bolus 500 mL (500 mLs Intravenous New Bag/Given 11/19/17 1756)  ondansetron (ZOFRAN) injection 4 mg (4 mg Intravenous Given 11/19/17 1752)  morphine 4 MG/ML injection 4 mg (4 mg Intravenous Given 11/19/17 1752)     Initial Impression / Assessment and Plan / ED Course  I have reviewed the triage vital signs and the nursing notes.  Pertinent labs & imaging results that were available during my care of the patient were reviewed by me and considered in my medical decision making (see chart for details).     Plain films of right hip reveal a femoral neck fracture.  Discussed with Dr. Annell Greening orthopedics.  Will admit to the hospitalist service and transfer to [facility].  Final Clinical Impressions(s) / ED Diagnoses   Final diagnoses:  Fall, initial encounter  Closed fracture of neck of right femur, initial encounter [facility])    ED Discharge Orders    None       Donnetta Hutching, MD 11/19/17 1945

## 2017-11-19 NOTE — H&P (Addendum)
History and Physical    Christy MedinMargaret S First ONG:[id number] DOB: [birth year] DOA: 11/19/2017  PCP: Gareth MorganKnowlton, Steve, MD   Patient coming from: Home  Chief Complaint: R hip pain s/p fall  HPI: Christy Fletcher is a 77 y.o. female with medical history significant for osteopenia, dyslipidemia, and arthritis who presented to the emergency department after she developed severe right-sided hip pain after a mechanical fall at home.  Patient was apparently walking from her dining room to the living room where the floor is slightly on level and this caused her to trip and fall on her right hip.  She denies any lightheadedness or dizziness accompanying the event.  Her pain level is moderate and is worsened with palpation and movement.  The pain is better with rest. She denies any recent illness, fever, or chills. She denies any chest pain, or shortness of breath.   ED Course: Vital signs stable and lab work is otherwise unremarkable.  Chest x-ray with no acute findings and pelvic x-ray demonstrates right femoral neck fracture that is closed and nondisplaced.  She is in mild pain and currently receiving some fentanyl.  Review of Systems: All others reviewed and otherwise negative.  Past Medical History:  Diagnosis Date  . Arthritis   . Diarrhea   . Hypercholesteremia   . Osteopenia     Past Surgical History:  Procedure Laterality Date  . ABDOMINAL HYSTERECTOMY    . CATARACT EXTRACTION    . COLONOSCOPY    . COLONOSCOPY N/A 05/14/2013   Procedure: COLONOSCOPY;  Surgeon: West BaliSandi L Fields, MD;  Location: [facility];  Service: Endoscopy;  Laterality: N/A;  10:30 AM-rescheduled to 8:45am Doris notified pt  . HERNIA REPAIR       reports that  has never smoked. she has never used smokeless tobacco. She reports that she does not drink alcohol or use drugs.  No Known Allergies  Family History  Problem Relation Age of Onset  . Colon cancer Neg Hx     Prior to Admission medications   Medication Sig  Start Date End Date Taking? Authorizing Provider  calcium carbonate (OS-CAL) 600 MG TABS tablet Take 1,200 mg by mouth daily.   Yes [provider]  lovastatin (MEVACOR) 20 MG tablet Take 20 mg by mouth at bedtime.   Yes [provider]    Physical Exam: Vitals:   11/19/17 1711 11/19/17 1714 11/19/17 1800 11/19/17 1830  BP:  (!) 142/76 137/72 127/64  Pulse:  72 80 80  Resp:  20 16 18   Temp:  97.8 F (36.6 C)    TempSrc:  Oral    SpO2:  96% 95% 96%  Weight: 62.6 kg (138 lb)     Height: 5\' 1"  (1.549 m)       Constitutional: NAD, calm, comfortable Vitals:   11/19/17 1711 11/19/17 1714 11/19/17 1800 11/19/17 1830  BP:  (!) 142/76 137/72 127/64  Pulse:  72 80 80  Resp:  20 16 18   Temp:  97.8 F (36.6 C)    TempSrc:  Oral    SpO2:  96% 95% 96%  Weight: 62.6 kg (138 lb)     Height: 5\' 1"  (1.549 m)      Eyes: lids and conjunctivae normal ENMT: Mucous membranes are moist.  Neck: normal, supple Respiratory: clear to auscultation bilaterally. Normal respiratory effort. No accessory muscle use.  Cardiovascular: Regular rate and rhythm, no murmurs. No extremity edema. Abdomen: no tenderness, no distention. Bowel sounds positive.  Musculoskeletal:  No  joint deformity upper and lower extremities.  Right hip tenderness to palpation. Skin: no rashes, lesions, ulcers.  Psychiatric: Normal judgment and insight. Alert and oriented x 3. Normal mood.   Labs on Admission: I have personally reviewed following labs and imaging studies  CBC: Recent Labs  Lab 11/19/17 1828  WBC 9.5  NEUTROABS 7.9*  HGB 12.9  HCT 39.5  MCV 90.4  PLT 220   Basic Metabolic Panel: Recent Labs  Lab 11/19/17 1828  NA 140  K 3.9  CL 105  CO2 26  GLUCOSE 104*  BUN 15  CREATININE 0.67  CALCIUM 9.1   GFR: Estimated Creatinine Clearance: 51.5 mL/min (by C-G formula based on SCr of 0.67 mg/dL). Liver Function Tests: No results for input(s): AST, ALT, ALKPHOS, BILITOT, PROT, ALBUMIN  in the last 168 hours. No results for input(s): LIPASE, AMYLASE in the last 168 hours. No results for input(s): AMMONIA in the last 168 hours. Coagulation Profile: No results for input(s): INR, PROTIME in the last 168 hours. Cardiac Enzymes: No results for input(s): CKTOTAL, CKMB, CKMBINDEX, TROPONINI in the last 168 hours. BNP (last 3 results) No results for input(s): PROBNP in the last 8760 hours. HbA1C: No results for input(s): HGBA1C in the last 72 hours. CBG: No results for input(s): GLUCAP in the last 168 hours. Lipid Profile: No results for input(s): CHOL, HDL, LDLCALC, TRIG, CHOLHDL, LDLDIRECT in the last 72 hours. Thyroid Function Tests: No results for input(s): TSH, T4TOTAL, FREET4, T3FREE, THYROIDAB in the last 72 hours. Anemia Panel: No results for input(s): VITAMINB12, FOLATE, FERRITIN, TIBC, IRON, RETICCTPCT in the last 72 hours. Urine analysis: No results found for: COLORURINE, APPEARANCEUR, LABSPEC, PHURINE, GLUCOSEU, HGBUR, BILIRUBINUR, KETONESUR, PROTEINUR, UROBILINOGEN, NITRITE, LEUKOCYTESUR  Radiological Exams on Admission: Dg Chest 1 View  Result Date: 11/19/2017 CLINICAL DATA:  Fall. EXAM: CHEST 1 VIEW COMPARISON:  June 26, 2010 FINDINGS: Prominence of the mediastinum is probably due to the portable technique. A PA and lateral chest x-ray could further evaluate if clinically warranted. The heart, hila, and pleura are normal. No pneumothorax. No pulmonary nodules, masses, or focal infiltrates. IMPRESSION: Mild prominence of mediastinum is likely due to portable technique. No other abnormalities. Electronically Signed   By: Gerome Sam III M.D   On: 11/19/2017 18:40   Dg Hip Unilat W Or Wo Pelvis 2-3 Views Right  Result Date: 11/19/2017 CLINICAL DATA:  Pain after fall EXAM: DG HIP (WITH OR WITHOUT PELVIS) 2-3V RIGHT COMPARISON:  None. FINDINGS: There is a fracture through the right femoral neck without femoral head dislocation. No other acute abnormalities  identified. IMPRESSION: Right femoral neck fracture. Electronically Signed   By: Gerome Sam III M.D   On: 11/19/2017 18:39    Assessment/Plan Principal Problem:   Hip fracture (HCC) Active Problems:   Osteopenia   Arthritis   Dyslipidemia    1. Right femoral neck fracture status post mechanical fall.  N.p.o. after midnight.  Consultation to orthopedics for further evaluation in a.m.  Pain management with IV medications as needed.  DVT prophylaxis with Lovenox. Gentle, time-limited IVF. 2. Dyslipidemia.  Cardiac prudent diet.  Continue statin medication. 3. Osteopenia.  Continue calcium supplementation.   DVT prophylaxis: Lovenox Code Status: Full Family Communication: Son Loraine Leriche at bedside Disposition Plan:To [facility] for orthopedic evaluation Consults called:EDP spoke with Dr. Ophelia Charter of orthopedics Admission status: Inpatient, med-surg   Erik Nessel Hoover Brunette DO [facility] Pager [phone]  If 7PM-7AM, please contact night-coverage [email or web address] Password [facility]  11/19/2017, 7:52 PM

## 2017-11-19 NOTE — ED Triage Notes (Signed)
Pt tripped at home and fell onto hard wood floor.  C/O pain in r femur/hip.  EMS says area felt swollen on palpation and has a mild shortening and rotation.  EMS gave 2mg  morphine pta.

## 2017-11-19 NOTE — Progress Notes (Signed)
Patient ID: Christy MedinMargaret S Yazdani, female   DOB: 1940/10/04, 77 y.o.   MRN: [id number] Reviewed xrays and chart. [facility] and I have posted her for [facility] at about 10 AM Sunday. Will see in AM and discuss procedure with her. NPO after MN.  My cell [phone]

## 2017-11-20 ENCOUNTER — Inpatient Hospital Stay (HOSPITAL_COMMUNITY): Payer: Medicare HMO | Admitting: Certified Registered Nurse Anesthetist

## 2017-11-20 ENCOUNTER — Encounter (HOSPITAL_COMMUNITY)
Admission: EM | Disposition: A | Discharge status: Home / Self Care | Payer: Self-pay | Source: Home / Self Care | Attending: Family Medicine

## 2017-11-20 ENCOUNTER — Inpatient Hospital Stay (HOSPITAL_COMMUNITY): Payer: Medicare HMO

## 2017-11-20 DIAGNOSIS — S72001A Fracture of unspecified part of neck of right femur, initial encounter for closed fracture: Secondary | ICD-10-CM

## 2017-11-20 HISTORY — PX: HIP ARTHROPLASTY: SHX981

## 2017-11-20 LAB — MRSA PCR SCREENING: MRSA by PCR: NEGATIVE

## 2017-11-20 SURGERY — HEMIARTHROPLASTY, HIP, DIRECT ANTERIOR APPROACH, FOR FRACTURE
Anesthesia: General | Site: Hip | Laterality: Right

## 2017-11-20 MED ORDER — BUPIVACAINE HCL (PF) 0.25 % IJ SOLN
INTRAMUSCULAR | Status: DC | PRN
  Administered 2017-11-20: 18 mL

## 2017-11-20 MED ORDER — DEXTROSE-NACL 5-0.45 % IV SOLN
INTRAVENOUS | Status: DC
  Administered 2017-11-20: 13:00:00 via INTRAVENOUS

## 2017-11-20 MED ORDER — VITAMIN D (ERGOCALCIFEROL) 1.25 MG (50000 UNIT) PO CAPS
50000.0000 [IU] | ORAL_CAPSULE | ORAL | Status: DC
  Administered 2017-11-20: 50000 [IU] via ORAL
  Filled 2017-11-20: qty 1

## 2017-11-20 MED ORDER — ACETAMINOPHEN 10 MG/ML IV SOLN
INTRAVENOUS | Status: AC
  Filled 2017-11-20: qty 100

## 2017-11-20 MED ORDER — CELECOXIB 200 MG PO CAPS
200.0000 mg | ORAL_CAPSULE | Freq: Two times a day (BID) | ORAL | Status: DC
  Administered 2017-11-20 – 2017-11-24 (×9): 200 mg via ORAL
  Filled 2017-11-20 (×9): qty 1

## 2017-11-20 MED ORDER — ONDANSETRON HCL 4 MG/2ML IJ SOLN: 4.0000 mg | Freq: Four times a day (QID) | INTRAMUSCULAR | Status: DC | PRN

## 2017-11-20 MED ORDER — ACETAMINOPHEN 500 MG PO TABS
500.0000 mg | ORAL_TABLET | Freq: Four times a day (QID) | ORAL | Status: AC
  Administered 2017-11-20 – 2017-11-21 (×4): 500 mg via ORAL
  Filled 2017-11-20 (×3): qty 1

## 2017-11-20 MED ORDER — DEXTROSE 5 % IV SOLN
INTRAVENOUS | Status: DC | PRN
  Administered 2017-11-20: 35 ug/min via INTRAVENOUS

## 2017-11-20 MED ORDER — KETOROLAC TROMETHAMINE 15 MG/ML IJ SOLN
15.0000 mg | Freq: Four times a day (QID) | INTRAMUSCULAR | Status: AC
  Administered 2017-11-20 – 2017-11-21 (×4): 15 mg via INTRAVENOUS
  Filled 2017-11-20 (×3): qty 1

## 2017-11-20 MED ORDER — METOCLOPRAMIDE HCL 5 MG/ML IJ SOLN: 5.0000 mg | Freq: Three times a day (TID) | INTRAMUSCULAR | Status: DC | PRN

## 2017-11-20 MED ORDER — PHENOL 1.4 % MT LIQD: 1.0000 | OROMUCOSAL | Status: DC | PRN

## 2017-11-20 MED ORDER — ROCURONIUM BROMIDE 100 MG/10ML IV SOLN
INTRAVENOUS | Status: DC | PRN
  Administered 2017-11-20: 10 mg via INTRAVENOUS
  Administered 2017-11-20: 40 mg via INTRAVENOUS

## 2017-11-20 MED ORDER — SUGAMMADEX SODIUM 200 MG/2ML IV SOLN
INTRAVENOUS | Status: DC | PRN
  Administered 2017-11-20: 150 mg via INTRAVENOUS

## 2017-11-20 MED ORDER — FENTANYL CITRATE (PF) 250 MCG/5ML IJ SOLN
INTRAMUSCULAR | Status: AC
  Filled 2017-11-20: qty 5

## 2017-11-20 MED ORDER — ACETAMINOPHEN 500 MG PO TABS
ORAL_TABLET | ORAL | Status: AC
  Filled 2017-11-20: qty 1

## 2017-11-20 MED ORDER — MENTHOL 3 MG MT LOZG: 1.0000 | LOZENGE | OROMUCOSAL | Status: DC | PRN

## 2017-11-20 MED ORDER — PROPOFOL 10 MG/ML IV BOLUS
INTRAVENOUS | Status: DC | PRN
  Administered 2017-11-20: 50 mg via INTRAVENOUS

## 2017-11-20 MED ORDER — KETOROLAC TROMETHAMINE 15 MG/ML IJ SOLN
INTRAMUSCULAR | Status: AC
  Administered 2017-11-20: 15 mg via INTRAVENOUS
  Filled 2017-11-20: qty 1

## 2017-11-20 MED ORDER — DOCUSATE SODIUM 100 MG PO CAPS
100.0000 mg | ORAL_CAPSULE | Freq: Two times a day (BID) | ORAL | Status: DC
  Administered 2017-11-20 – 2017-11-23 (×8): 100 mg via ORAL
  Filled 2017-11-20 (×9): qty 1

## 2017-11-20 MED ORDER — GABAPENTIN 300 MG PO CAPS
300.0000 mg | ORAL_CAPSULE | Freq: Three times a day (TID) | ORAL | Status: DC
  Administered 2017-11-20 – 2017-11-24 (×11): 300 mg via ORAL
  Filled 2017-11-20 (×11): qty 1

## 2017-11-20 MED ORDER — HYDROCODONE-ACETAMINOPHEN 5-325 MG PO TABS
1.0000 | ORAL_TABLET | ORAL | Status: DC | PRN
  Administered 2017-11-21: 1 via ORAL
  Administered 2017-11-23 – 2017-11-24 (×2): 2 via ORAL
  Filled 2017-11-20: qty 2
  Filled 2017-11-20: qty 1
  Filled 2017-11-20: qty 2

## 2017-11-20 MED ORDER — DEXAMETHASONE SODIUM PHOSPHATE 4 MG/ML IJ SOLN
INTRAMUSCULAR | Status: DC | PRN
  Administered 2017-11-20: 8 mg via INTRAVENOUS

## 2017-11-20 MED ORDER — ACETAMINOPHEN 325 MG PO TABS: 325.0000 mg | ORAL_TABLET | Freq: Four times a day (QID) | ORAL | Status: DC | PRN

## 2017-11-20 MED ORDER — LIDOCAINE HCL (CARDIAC) 20 MG/ML IV SOLN
INTRAVENOUS | Status: DC | PRN
  Administered 2017-11-20: 60 mg via INTRAVENOUS

## 2017-11-20 MED ORDER — FLEET ENEMA 7-19 GM/118ML RE ENEM: 1.0000 | ENEMA | Freq: Once | RECTAL | Status: DC | PRN

## 2017-11-20 MED ORDER — BUPIVACAINE HCL (PF) 0.25 % IJ SOLN
INTRAMUSCULAR | Status: AC
  Filled 2017-11-20: qty 30

## 2017-11-20 MED ORDER — MORPHINE SULFATE (PF) 2 MG/ML IV SOLN: 0.5000 mg | INTRAVENOUS | Status: DC | PRN

## 2017-11-20 MED ORDER — PROPOFOL 10 MG/ML IV BOLUS
INTRAVENOUS | Status: AC
  Filled 2017-11-20: qty 20

## 2017-11-20 MED ORDER — LACTATED RINGERS IV SOLN
INTRAVENOUS | Status: DC | PRN
  Administered 2017-11-20: 10:00:00 via INTRAVENOUS

## 2017-11-20 MED ORDER — BISACODYL 10 MG RE SUPP: 10.0000 mg | Freq: Every day | RECTAL | Status: DC | PRN

## 2017-11-20 MED ORDER — METOCLOPRAMIDE HCL 5 MG PO TABS: 5.0000 mg | ORAL_TABLET | Freq: Three times a day (TID) | ORAL | Status: DC | PRN

## 2017-11-20 MED ORDER — ONDANSETRON HCL 4 MG PO TABS: 4.0000 mg | ORAL_TABLET | Freq: Four times a day (QID) | ORAL | Status: DC | PRN

## 2017-11-20 MED ORDER — SENNOSIDES-DOCUSATE SODIUM 8.6-50 MG PO TABS: 1.0000 | ORAL_TABLET | Freq: Every evening | ORAL | Status: DC | PRN

## 2017-11-20 MED ORDER — FENTANYL CITRATE (PF) 100 MCG/2ML IJ SOLN: 25.0000 ug | INTRAMUSCULAR | Status: DC | PRN

## 2017-11-20 MED ORDER — 0.9 % SODIUM CHLORIDE (POUR BTL) OPTIME
TOPICAL | Status: DC | PRN
  Administered 2017-11-20: 1000 mL

## 2017-11-20 MED ORDER — ENOXAPARIN SODIUM 40 MG/0.4ML ~~LOC~~ SOLN
40.0000 mg | SUBCUTANEOUS | Status: DC
  Administered 2017-11-21 – 2017-11-24 (×4): 40 mg via SUBCUTANEOUS
  Filled 2017-11-20 (×4): qty 0.4

## 2017-11-20 MED ORDER — FENTANYL CITRATE (PF) 100 MCG/2ML IJ SOLN
INTRAMUSCULAR | Status: DC | PRN
  Administered 2017-11-20 (×2): 50 ug via INTRAVENOUS

## 2017-11-20 MED ORDER — HYDROCODONE-ACETAMINOPHEN 7.5-325 MG PO TABS: 1.0000 | ORAL_TABLET | ORAL | Status: DC | PRN

## 2017-11-20 SURGICAL SUPPLY — 65 items
BENZOIN TINCTURE PRP APPL 2/3 (GAUZE/BANDAGES/DRESSINGS) IMPLANT
BLADE CLIPPER SURG (BLADE) IMPLANT
BLADE SAW SAG 73X25 THK (BLADE) ×1
BLADE SAW SGTL 73X25 THK (BLADE) ×2 IMPLANT
BRUSH FEMORAL CANAL (MISCELLANEOUS) IMPLANT
CAPT HIP HEMI 2 ×3 IMPLANT
CLOSURE WOUND 1/2 X4 (GAUZE/BANDAGES/DRESSINGS) ×1
COVER BACK TABLE 24X17X13 BIG (DRAPES) IMPLANT
DRAPE IMP U-DRAPE 54X76 (DRAPES) ×3 IMPLANT
DRAPE INCISE IOBAN 66X45 STRL (DRAPES) IMPLANT
DRAPE ORTHO SPLIT 77X108 STRL (DRAPES) ×4
DRAPE SURG ORHT 6 SPLT 77X108 (DRAPES) ×2 IMPLANT
DRAPE U-SHAPE 47X51 STRL (DRAPES) ×3 IMPLANT
DRSG MEPILEX BORDER 4X8 (GAUZE/BANDAGES/DRESSINGS) IMPLANT
DRSG PAD ABDOMINAL 8X10 ST (GAUZE/BANDAGES/DRESSINGS) ×3 IMPLANT
DURAPREP 26ML APPLICATOR (WOUND CARE) ×3 IMPLANT
ELECT CAUTERY BLADE 6.4 (BLADE) ×3 IMPLANT
ELECT REM PT RETURN 9FT ADLT (ELECTROSURGICAL) ×3
ELECTRODE REM PT RTRN 9FT ADLT (ELECTROSURGICAL) ×1 IMPLANT
EVACUATOR 1/8 PVC DRAIN (DRAIN) IMPLANT
FACESHIELD WRAPAROUND (MASK) ×6 IMPLANT
GAUZE SPONGE 4X4 12PLY STRL (GAUZE/BANDAGES/DRESSINGS) IMPLANT
GAUZE SPONGE 4X4 12PLY STRL LF (GAUZE/BANDAGES/DRESSINGS) ×3 IMPLANT
GAUZE XEROFORM 5X9 LF (GAUZE/BANDAGES/DRESSINGS) ×3 IMPLANT
GLOVE BIOGEL PI IND STRL 8 (GLOVE) ×2 IMPLANT
GLOVE BIOGEL PI INDICATOR 8 (GLOVE) ×4
GLOVE ORTHO TXT STRL SZ7.5 (GLOVE) ×6 IMPLANT
GOWN STRL REUS W/ TWL LRG LVL3 (GOWN DISPOSABLE) ×1 IMPLANT
GOWN STRL REUS W/ TWL XL LVL3 (GOWN DISPOSABLE) ×1 IMPLANT
GOWN STRL REUS W/TWL 2XL LVL3 (GOWN DISPOSABLE) ×3 IMPLANT
GOWN STRL REUS W/TWL LRG LVL3 (GOWN DISPOSABLE) ×2
GOWN STRL REUS W/TWL XL LVL3 (GOWN DISPOSABLE) ×2
HANDPIECE INTERPULSE COAX TIP (DISPOSABLE)
IMMOBILIZER KNEE 22 (SOFTGOODS) ×3 IMPLANT
IMMOBILIZER KNEE 22 UNIV (SOFTGOODS) IMPLANT
KIT BASIN OR (CUSTOM PROCEDURE TRAY) ×3 IMPLANT
KIT ROOM TURNOVER OR (KITS) ×3 IMPLANT
MANIFOLD NEPTUNE II (INSTRUMENTS) ×3 IMPLANT
NDL SUT 2 .5 CRC MAYO 1.732X (NEEDLE) ×1 IMPLANT
NEEDLE HYPO 25GX1X1/2 BEV (NEEDLE) ×3 IMPLANT
NEEDLE MAYO TAPER (NEEDLE) ×2
NS IRRIG 1000ML POUR BTL (IV SOLUTION) ×3 IMPLANT
PACK TOTAL JOINT (CUSTOM PROCEDURE TRAY) ×3 IMPLANT
PACK UNIVERSAL I (CUSTOM PROCEDURE TRAY) ×3 IMPLANT
PAD ABD 8X10 STRL (GAUZE/BANDAGES/DRESSINGS) ×3 IMPLANT
PAD ARMBOARD 7.5X6 YLW CONV (MISCELLANEOUS) ×9 IMPLANT
PIN STEINMAN 3/16 (PIN) ×3 IMPLANT
SET HNDPC FAN SPRY TIP SCT (DISPOSABLE) IMPLANT
SPONGE LAP 4X18 X RAY DECT (DISPOSABLE) IMPLANT
STAPLER VISISTAT 35W (STAPLE) ×3 IMPLANT
STRIP CLOSURE SKIN 1/2X4 (GAUZE/BANDAGES/DRESSINGS) ×2 IMPLANT
SUCTION FRAZIER HANDLE 10FR (MISCELLANEOUS) ×2
SUCTION TUBE FRAZIER 10FR DISP (MISCELLANEOUS) ×1 IMPLANT
SUT ETHIBOND NAB CT1 #1 30IN (SUTURE) ×9 IMPLANT
SUT TICRON (SUTURE) ×6 IMPLANT
SUT VIC AB 2-0 CT1 27 (SUTURE) ×6
SUT VIC AB 2-0 CT1 TAPERPNT 27 (SUTURE) ×3 IMPLANT
SUT VICRYL 0 TIES 12 18 (SUTURE) ×3 IMPLANT
SYR CONTROL 10ML LL (SYRINGE) ×3 IMPLANT
TAPE CLOTH SURG 6X10 WHT LF (GAUZE/BANDAGES/DRESSINGS) ×3 IMPLANT
TOWEL OR 17X24 6PK STRL BLUE (TOWEL DISPOSABLE) ×3 IMPLANT
TOWEL OR 17X26 10 PK STRL BLUE (TOWEL DISPOSABLE) ×3 IMPLANT
TOWER CARTRIDGE SMART MIX (DISPOSABLE) IMPLANT
TRAY FOLEY W/METER SILVER 16FR (SET/KITS/TRAYS/PACK) IMPLANT
WATER STERILE IRR 1000ML POUR (IV SOLUTION) ×9 IMPLANT

## 2017-11-20 NOTE — Anesthesia Preprocedure Evaluation (Addendum)
Anesthesia Evaluation  Patient identified by MRN, date of birth, ID band Patient awake    Reviewed: Allergy & Precautions, H&P , NPO status , Patient's Chart, lab work & pertinent test results  Airway Mallampati: III  TM Distance: >3 FB Neck ROM: Full    Dental no notable dental hx. (+) Teeth Intact, Dental Advisory Given   Pulmonary neg pulmonary ROS,    Pulmonary exam normal breath sounds clear to auscultation       Cardiovascular negative cardio ROS   Rhythm:Regular Rate:Normal     Neuro/Psych negative neurological ROS  negative psych ROS   GI/Hepatic negative GI ROS, Neg liver ROS,   Endo/Other  negative endocrine ROS  Renal/GU negative Renal ROS  negative genitourinary   Musculoskeletal  (+) Arthritis , Osteoarthritis,    Abdominal   Peds  Hematology negative hematology ROS (+)   Anesthesia Other Findings   Reproductive/Obstetrics negative OB ROS                            Anesthesia Physical Anesthesia Plan  ASA: II  Anesthesia Plan: General   Post-op Pain Management:    Induction: Intravenous  PONV Risk Score and Plan: 4 or greater and Ondansetron, Dexamethasone and Treatment may vary due to age or medical condition  Airway Management Planned: Oral ETT  Additional Equipment:   Intra-op Plan:   Post-operative Plan: Extubation in OR  Informed Consent: I have reviewed the patients History and Physical, chart, labs and discussed the procedure including the risks, benefits and alternatives for the proposed anesthesia with the patient or authorized representative who has indicated his/her understanding and acceptance.   Dental advisory given  Plan Discussed with: CRNA  Anesthesia Plan Comments:         Anesthesia Quick Evaluation

## 2017-11-20 NOTE — Consult Note (Signed)
Reason for Consult: Right displaced femoral neck fracture, closed. Referring Physician: Dr. Claudina Lick Christy Fletcher is an 77 y.o. female.  HPI: 77 year old female fell at home was unable to walk and had sharp acute right hip pain.  She was taken by EMS to [facility] where x-rays demonstrated a closed displaced femoral neck fracture.  Patient has history of osteopenia previous radial neck fracture 2015 which healed.  She works at [employer], has a husband who has AKA and is in a wheelchair they have a wheelchair ramp at their house.  Patient's daughter is present for additional history.  Patient is a Hydrographic surveyor.  No history of hip osteoarthritis.  Patient denies dizziness no loss consciousness no other injuries with her fall.  Past Medical History:  Diagnosis Date  . Arthritis   . Diarrhea   . Hypercholesteremia   . Osteopenia     Past Surgical History:  Procedure Laterality Date  . ABDOMINAL HYSTERECTOMY    . CATARACT EXTRACTION    . COLONOSCOPY    . COLONOSCOPY N/A 05/14/2013   Procedure: COLONOSCOPY;  Surgeon: Danie Binder, MD;  [facility];  Service: Endoscopy;  Laterality: N/A;  10:30 AM-rescheduled to 8:45am Doris notified pt  . HERNIA REPAIR      Family History  Problem Relation Age of Onset  . Colon cancer Neg Hx     Social History:  reports that  has never smoked. she has never used smokeless tobacco. She reports that she does not drink alcohol or use drugs.  Allergies: No Known Allergies  Medications: I have reviewed the patient's current medications.  Results for orders placed or performed during the hospital encounter of 11/19/17 (from the past 48 hour(s))  CBC with Differential     Status: Abnormal   Collection Time: 11/19/17  6:28 PM  Result Value Ref Range   WBC 9.5 4.0 - 10.5 K/uL   RBC 4.37 3.87 - 5.11 MIL/uL   Hemoglobin 12.9 12.0 - 15.0 g/dL   HCT 39.5 36.0 - 46.0 %   MCV 90.4 78.0 - 100.0 fL   MCH 29.5 26.0 -  34.0 pg   MCHC 32.7 30.0 - 36.0 g/dL   RDW 13.3 11.5 - 15.5 %   Platelets 220 150 - 400 K/uL   Neutrophils Relative % 83 %   Neutro Abs 7.9 (H) 1.7 - 7.7 K/uL   Lymphocytes Relative 10 %   Lymphs Abs 1.0 0.7 - 4.0 K/uL   Monocytes Relative 6 %   Monocytes Absolute 0.6 0.1 - 1.0 K/uL   Eosinophils Relative 1 %   Eosinophils Absolute 0.1 0.0 - 0.7 K/uL   Basophils Relative 0 %   Basophils Absolute 0.0 0.0 - 0.1 K/uL    Comment: Performed at [facility], [address], [place], [place] [address]  Basic metabolic panel     Status: Abnormal   Collection Time: 11/19/17  6:28 PM  Result Value Ref Range   Sodium 140 135 - 145 mmol/L   Potassium 3.9 3.5 - 5.1 mmol/L   Chloride 105 101 - 111 mmol/L   CO2 26 22 - 32 mmol/L   Glucose, Bld 104 (H) 65 - 99 mg/dL   BUN 15 6 - 20 mg/dL   Creatinine, Ser 0.67 0.44 - 1.00 mg/dL   Calcium 9.1 8.9 - 10.3 mg/dL   GFR calc non Af Amer >60 >60 mL/min   GFR calc Af Amer >60 >60 mL/min  Comment: (NOTE) The eGFR has been calculated using the CKD EPI equation. This calculation has not been validated in all clinical situations. eGFR's persistently <60 mL/min signify possible Chronic Kidney Disease.    Anion gap 9 5 - 15    Comment: Performed at [facility], [address], [place], [place] [address]  MRSA PCR Screening     Status: None   Collection Time: 11/20/17  5:37 AM  Result Value Ref Range   MRSA by PCR NEGATIVE NEGATIVE    Comment:        The GeneXpert MRSA Assay (FDA approved for NASAL specimens only), is one component of a comprehensive MRSA colonization surveillance program. It is not intended to diagnose MRSA infection nor to guide or monitor treatment for MRSA infections. Performed at [facility], [place] [address] [place], [place] [address]     Dg Chest 1 View  Result Date: 11/19/2017 CLINICAL DATA:  Fall. EXAM: CHEST 1 VIEW COMPARISON:  June 26, 2010 FINDINGS: Prominence of the mediastinum is probably due to the  portable technique. A PA and lateral chest x-ray could further evaluate if clinically warranted. The heart, hila, and pleura are normal. No pneumothorax. No pulmonary nodules, masses, or focal infiltrates. IMPRESSION: Mild prominence of mediastinum is likely due to portable technique. No other abnormalities. Electronically Signed   By: Dorise Bullion III M.D   On: 11/19/2017 18:40   Dg Hip Unilat W Or Wo Pelvis 2-3 Views Right  Result Date: 11/19/2017 CLINICAL DATA:  Pain after fall EXAM: DG HIP (WITH OR WITHOUT PELVIS) 2-3V RIGHT COMPARISON:  None. FINDINGS: There is a fracture through the right femoral neck without femoral head dislocation. No other acute abnormalities identified. IMPRESSION: Right femoral neck fracture. Electronically Signed   By: Dorise Bullion III M.D   On: 11/19/2017 18:39    ROS patient wears glasses, previous routine colonoscopy.  Previous hysterectomy, hernia repair.  Patient's non-smoker does not drink.  She takes calcium and Mevacor.  Otherwise negative as it pertains HPI 14 point review of systems performed. Blood pressure (!) 118/54, pulse 89, temperature 98.7 F (37.1 C), temperature source Oral, resp. rate 16, height 5' 1"  (1.549 m), weight 138 lb (62.6 kg), SpO2 97 %. Physical Exam  Constitutional: She is oriented to person, place, and time. She appears well-developed and well-nourished.  HENT:  Head: Normocephalic and atraumatic.  Eyes: Conjunctivae and EOM are normal. Pupils are equal, round, and reactive to light.  Neck: Normal range of motion. Neck supple. No tracheal deviation present. No thyromegaly present.  Cardiovascular: Normal rate and regular rhythm.  Respiratory: Effort normal and breath sounds normal. She has no wheezes.  GI: Soft. Bowel sounds are normal.  Musculoskeletal:  Right lower extremity shortened and externally rotated.  Intact sciatic motor and sensory function in the lower extremity.  Pulses dorsalis pedis posterior tibial pulse are  normal.  Pain with right hip range of motion.  No knee effusion.  Neurological: She is alert and oriented to person, place, and time.  Skin: Skin is warm. No rash noted. No erythema.  Psychiatric: She has a normal mood and affect. Her behavior is normal. Thought content normal.    Assessment/Plan: 77 year old female community ambulator with displaced right femoral neck fracture.  We will proceed with bipolar hemiarthroplasty of the right hip.  If she does well with therapy plan to be discharged home.  Wrist surgery discussed treatment plan discussed.  X-ray findings were reviewed.  Questions were elicited and answered.  Daughter was present for the discussion, patient understands and requests we proceed.  Marybelle Killings 11/20/2017, 8:30 AM

## 2017-11-20 NOTE — Anesthesia Procedure Notes (Signed)
Procedure Name: Intubation Date/Time: 11/20/2017 10:19 AM Performed by: Oletta Lamas, CRNA Pre-anesthesia Checklist: Patient identified, Emergency Drugs available, Suction available and Patient being monitored Patient Re-evaluated:Patient Re-evaluated prior to induction Oxygen Delivery Method: Circle System Utilized Preoxygenation: Pre-oxygenation with 100% oxygen Induction Type: IV induction Ventilation: Mask ventilation without difficulty Laryngoscope Size: Mac and 3 Grade View: Grade I Tube type: Oral Tube size: 7.0 mm Number of attempts: 1 Airway Equipment and Method: Stylet Placement Confirmation: ETT inserted through vocal cords under direct vision,  positive ETCO2 and breath sounds checked- equal and bilateral Secured at: 22 cm Tube secured with: Tape Dental Injury: Teeth and Oropharynx as per pre-operative assessment

## 2017-11-20 NOTE — Op Note (Signed)
Preop diagnosis: Right displaced femoral neck fracture  Postop diagnosis: Same  Procedure: Right press-fit hemiarthroplasty ( Press fit  Depuy #3 Duofix stem, 47 mm bipolar snap-on head +1.5 neck length. )  Surgeon: Annell GreeningMark Rodriguez Aguinaldo MD  Anesthesia: General orotracheal plus Marcaine skin local  EBL: See anesthetic record  Procedure after induction general anesthesia Ancef prophylaxis placement in the lateral position Wilma Wuthrich 7 frame used for stabilization 1015 drape, DuraPrep impervious stockinette Coban and toe hit sheets drapes sterile skin marker and Betadine Steri-Drape x2 sealing the skin timeout procedure was completed.  Posterior approach was made trial retractor was placed at nerve protected piriformis tagged cut the posterior capsule opened and the femoral fracture was identified neck was cut 1 fingerbreadth above the lesser trochanter.  Newman PiesBall was removed with a corkscrew measured 47 size good suction fit.  Sequential preparation of the canal for a press-fit stem with reaming up to a 3 broaching up to 3 placement of permanent #3.  Trialing off the permanent stem showed reduction of leg length excellent stability flexion 90 internal rotation 85 degrees hip was still stable.  Trials removed canal was lavaged.  Permanent stem was impacted permanent 1.5 ball with 47 snap-on bipolar head was placed with good articulation.  Hip was reduced protecting the sciatic nerve.  Capsule was reapproximated piriformis repair to gluteus medius tendon with a #1 Ethibond suture that is been placed in it.  #1 Ethibond and the tensor fascia gluteus maximus fascia until Vicryl subtendinous tissue skin staple closure postop dressing and transferred to the recovery room.  Patient tolerated the procedure well.

## 2017-11-20 NOTE — Progress Notes (Signed)
Hospitalist progress note   Burnis MedinMargaret S Kercheval  ZOX:[id number] DOB: 07/11/1941 DOA: 11/19/2017 PCP: Gareth MorganKnowlton, Steve, MD   Specialists:   [facility]  Brief Narrative:  [age] fem, prior Radial neck fracture 2015, osteopenia hld admitted with hip #  Assessment & Plan:   Assessment:  The primary encounter diagnosis was Fall, initial encounter. Diagnoses of Fall and Closed fracture of neck of right femur, initial encounter [facility](HCC) were also pertinent to this visit.  HIp fracture 1. per discretion Ortho services-appreciate their input into   Anticoagulation post-op  Weight bearing tolerance for therapy services  Wound care  Pain management  Follow-up services required  Osteopenia-needs Dexa scan and supplementation VIt D, add Cholecalciferol  HLD-cont Mevacor 20 daily    DVT prophylaxis: per ortho  Code Status:    full   Family Communication:    None   Disposition Plan: inpatient   Consultants:    ortho  Procedures:   pending  Antimicrobials:   None    Subjective:  awakea elrt juts back from surg Still on o2 Sleepy No pain  Objective: Vitals:   11/19/17 2030 11/19/17 2100 11/19/17 2209 11/20/17 0439  BP: 139/74 (!) 113/50 128/60 (!) 118/54  Pulse: 79 95 83 89  Resp:  19 16 16   Temp:   98.9 F (37.2 C) 98.7 F (37.1 C)  TempSrc:   Oral Oral  SpO2: 94% 95% 96% 97%  Weight:      Height:        Intake/Output Summary (Last 24 hours) at 11/20/2017 0906 Last data filed at 11/20/2017 0510 Gross per 24 hour  Intake 418.33 ml  Output -  Net 418.33 ml   Filed Weights   11/19/17 1711  Weight: 62.6 kg (138 lb)    Examination:  eomi ncat  S`1 s 2no m/r/g cta b No added sound s1 s 2no m  Data Reviewed: I have personally reviewed following labs and imaging studies  CBC: Recent Labs  Lab 11/19/17 1828  WBC 9.5  NEUTROABS 7.9*  HGB 12.9  HCT 39.5  MCV 90.4  PLT 220   Basic Metabolic Panel: Recent Labs  Lab 11/19/17 1828  NA 140  K 3.9  CL 105   CO2 26  GLUCOSE 104*  BUN 15  CREATININE 0.67  CALCIUM 9.1   GFR: Estimated Creatinine Clearance: 51.5 mL/min (by C-G formula based on SCr of 0.67 mg/dL). Liver Function Tests: No results for input(s): AST, ALT, ALKPHOS, BILITOT, PROT, ALBUMIN in the last 168 hours. No results for input(s): LIPASE, AMYLASE in the last 168 hours. No results for input(s): AMMONIA in the last 168 hours. Coagulation Profile: No results for input(s): INR, PROTIME in the last 168 hours. Cardiac Enzymes: No results for input(s): CKTOTAL, CKMB, CKMBINDEX, TROPONINI in the last 168 hours. CBG: No results for input(s): GLUCAP in the last 168 hours. Urine analysis: No results found for: COLORURINE, APPEARANCEUR, LABSPEC, PHURINE, GLUCOSEU, HGBUR, BILIRUBINUR, KETONESUR, PROTEINUR, UROBILINOGEN, NITRITE, LEUKOCYTESUR   Radiology Studies: Reviewed images personally in health database    Scheduled Meds: . calcium carbonate  1 tablet Oral Q breakfast  . enoxaparin (LOVENOX) injection  40 mg Subcutaneous Q24H  . pravastatin  20 mg Oral q1800   Continuous Infusions: .  ceFAZolin (ANCEF) IV       LOS: 1 day    Time spent: 6615    Pleas KochJai Kupono Marling, MD [facility](P) [phone]   If 7PM-7AM, please contact night-coverage [email or web address] Password [email or web address] 11/20/2017, 9:06 AM

## 2017-11-20 NOTE — H&P (View-Only) (Signed)
Reason for Consult: Right displaced femoral neck fracture, closed. Referring Physician: Dr. Claudina Lick ROWYNN MCWEENEY is an 77 y.o. female.  HPI: 77 year old female fell at home was unable to walk and had sharp acute right hip pain.  She was taken by EMS to [facility] where x-rays demonstrated a closed displaced femoral neck fracture.  Patient has history of osteopenia previous radial neck fracture 2015 which healed.  She works at [employer], has a husband who has AKA and is in a wheelchair they have a wheelchair ramp at their house.  Patient's daughter is present for additional history.  Patient is a Hydrographic surveyor.  No history of hip osteoarthritis.  Patient denies dizziness no loss consciousness no other injuries with her fall.  Past Medical History:  Diagnosis Date  . Arthritis   . Diarrhea   . Hypercholesteremia   . Osteopenia     Past Surgical History:  Procedure Laterality Date  . ABDOMINAL HYSTERECTOMY    . CATARACT EXTRACTION    . COLONOSCOPY    . COLONOSCOPY N/A 05/14/2013   Procedure: COLONOSCOPY;  Surgeon: Danie Binder, MD;  Location: [facility];  Service: Endoscopy;  Laterality: N/A;  10:30 AM-rescheduled to 8:45am Doris notified pt  . HERNIA REPAIR      Family History  Problem Relation Age of Onset  . Colon cancer Neg Hx     Social History:  reports that  has never smoked. she has never used smokeless tobacco. She reports that she does not drink alcohol or use drugs.  Allergies: No Known Allergies  Medications: I have reviewed the patient's current medications.  Results for orders placed or performed during the hospital encounter of 11/19/17 (from the past 48 hour(s))  CBC with Differential     Status: Abnormal   Collection Time: 11/19/17  6:28 PM  Result Value Ref Range   WBC 9.5 4.0 - 10.5 K/uL   RBC 4.37 3.87 - 5.11 MIL/uL   Hemoglobin 12.9 12.0 - 15.0 g/dL   HCT 39.5 36.0 - 46.0 %   MCV 90.4 78.0 - 100.0 fL   MCH 29.5 26.0 -  34.0 pg   MCHC 32.7 30.0 - 36.0 g/dL   RDW 13.3 11.5 - 15.5 %   Platelets 220 150 - 400 K/uL   Neutrophils Relative % 83 %   Neutro Abs 7.9 (H) 1.7 - 7.7 K/uL   Lymphocytes Relative 10 %   Lymphs Abs 1.0 0.7 - 4.0 K/uL   Monocytes Relative 6 %   Monocytes Absolute 0.6 0.1 - 1.0 K/uL   Eosinophils Relative 1 %   Eosinophils Absolute 0.1 0.0 - 0.7 K/uL   Basophils Relative 0 %   Basophils Absolute 0.0 0.0 - 0.1 K/uL    Comment: Performed at [facility], [address], [place], [place] [address]  Basic metabolic panel     Status: Abnormal   Collection Time: 11/19/17  6:28 PM  Result Value Ref Range   Sodium 140 135 - 145 mmol/L   Potassium 3.9 3.5 - 5.1 mmol/L   Chloride 105 101 - 111 mmol/L   CO2 26 22 - 32 mmol/L   Glucose, Bld 104 (H) 65 - 99 mg/dL   BUN 15 6 - 20 mg/dL   Creatinine, Ser 0.67 0.44 - 1.00 mg/dL   Calcium 9.1 8.9 - 10.3 mg/dL   GFR calc non Af Amer >60 >60 mL/min   GFR calc Af Amer >60 >60 mL/min  Comment: (NOTE) The eGFR has been calculated using the CKD EPI equation. This calculation has not been validated in all clinical situations. eGFR's persistently <60 mL/min signify possible Chronic Kidney Disease.    Anion gap 9 5 - 15    Comment: Performed at [facility] [address], [place], [place] [address]  MRSA PCR Screening     Status: None   Collection Time: 11/20/17  5:37 AM  Result Value Ref Range   MRSA by PCR NEGATIVE NEGATIVE    Comment:        The GeneXpert MRSA Assay (FDA approved for NASAL specimens only), is one component of a comprehensive MRSA colonization surveillance program. It is not intended to diagnose MRSA infection nor to guide or monitor treatment for MRSA infections. Performed at [facility], [place] [address] [place], [place] [address]     Dg Chest 1 View  Result Date: 11/19/2017 CLINICAL DATA:  Fall. EXAM: CHEST 1 VIEW COMPARISON:  June 26, 2010 FINDINGS: Prominence of the mediastinum is probably due to the  portable technique. A PA and lateral chest x-ray could further evaluate if clinically warranted. The heart, hila, and pleura are normal. No pneumothorax. No pulmonary nodules, masses, or focal infiltrates. IMPRESSION: Mild prominence of mediastinum is likely due to portable technique. No other abnormalities. Electronically Signed   By: Dorise Bullion III M.D   On: 11/19/2017 18:40   Dg Hip Unilat W Or Wo Pelvis 2-3 Views Right  Result Date: 11/19/2017 CLINICAL DATA:  Pain after fall EXAM: DG HIP (WITH OR WITHOUT PELVIS) 2-3V RIGHT COMPARISON:  None. FINDINGS: There is a fracture through the right femoral neck without femoral head dislocation. No other acute abnormalities identified. IMPRESSION: Right femoral neck fracture. Electronically Signed   By: Dorise Bullion III M.D   On: 11/19/2017 18:39    ROS patient wears glasses, previous routine colonoscopy.  Previous hysterectomy, hernia repair.  Patient's non-smoker does not drink.  She takes calcium and Mevacor.  Otherwise negative as it pertains HPI 14 point review of systems performed. Blood pressure (!) 118/54, pulse 89, temperature 98.7 F (37.1 C), temperature source Oral, resp. rate 16, height 5' 1"  (1.549 m), weight 138 lb (62.6 kg), SpO2 97 %. Physical Exam  Constitutional: She is oriented to person, place, and time. She appears well-developed and well-nourished.  HENT:  Head: Normocephalic and atraumatic.  Eyes: Conjunctivae and EOM are normal. Pupils are equal, round, and reactive to light.  Neck: Normal range of motion. Neck supple. No tracheal deviation present. No thyromegaly present.  Cardiovascular: Normal rate and regular rhythm.  Respiratory: Effort normal and breath sounds normal. She has no wheezes.  GI: Soft. Bowel sounds are normal.  Musculoskeletal:  Right lower extremity shortened and externally rotated.  Intact sciatic motor and sensory function in the lower extremity.  Pulses dorsalis pedis posterior tibial pulse are  normal.  Pain with right hip range of motion.  No knee effusion.  Neurological: She is alert and oriented to person, place, and time.  Skin: Skin is warm. No rash noted. No erythema.  Psychiatric: She has a normal mood and affect. Her behavior is normal. Thought content normal.    Assessment/Plan: 77 year old female community ambulator with displaced right femoral neck fracture.  We will proceed with bipolar hemiarthroplasty of the right hip.  If she does well with therapy plan to be discharged home.  Wrist surgery discussed treatment plan discussed.  X-ray findings were reviewed.  Questions were elicited and answered.  Daughter was present for the discussion, patient understands and requests we proceed.  Marybelle Killings 11/20/2017, 8:30 AM

## 2017-11-20 NOTE — Interval H&P Note (Signed)
History and Physical Interval Note:  11/20/2017 10:06 AM  [name]  has presented today for surgery, with the diagnosis of RIGHT FEMORAL NECK FRACTURE  The various methods of treatment have been discussed with the patient and family. After consideration of risks, benefits and other options for treatment, the patient has consented to  Procedure(s): RIGHT HIP HEMIARTHROPLASTY (Right) as a surgical intervention .  The patient's history has been reviewed, patient examined, no change in status, stable for surgery.  I have reviewed the patient's chart and labs.  Questions were answered to the patient's satisfaction.     Eldred MangesMark C Domanik [place]

## 2017-11-20 NOTE — Anesthesia Postprocedure Evaluation (Signed)
Anesthesia Post Note  Patient: Christy Fletcher  Procedure(s) Performed: RIGHT HIP HEMIARTHROPLASTY (Right Hip)     Patient location during evaluation: PACU Anesthesia Type: General Level of consciousness: awake and alert Pain management: pain level controlled Vital Signs Assessment: post-procedure vital signs reviewed and stable Respiratory status: spontaneous breathing, nonlabored ventilation, respiratory function stable and patient connected to nasal cannula oxygen Cardiovascular status: blood pressure returned to baseline and stable Postop Assessment: no apparent nausea or vomiting Anesthetic complications: no    Last Vitals:  Vitals:   11/20/17 1230 11/20/17 1259  BP: (!) 107/53 (!) 120/56  Pulse: 70 89  Resp: 12 16  Temp: 36.5 C 36.7 C  SpO2: 96% 98%    Last Pain:  Vitals:   11/20/17 1259  TempSrc: Oral  PainSc:                  Camey Edell,W. EDMOND

## 2017-11-21 ENCOUNTER — Inpatient Hospital Stay (HOSPITAL_COMMUNITY): Payer: Medicare HMO

## 2017-11-21 ENCOUNTER — Encounter (HOSPITAL_COMMUNITY): Payer: Self-pay | Admitting: Orthopaedic Surgery

## 2017-11-21 LAB — BASIC METABOLIC PANEL
ANION GAP: 7 (ref 5–15)
BUN: 15 mg/dL (ref 6–20)
CO2: 25 mmol/L (ref 22–32)
Calcium: 8.6 mg/dL — ABNORMAL LOW (ref 8.9–10.3)
Chloride: 104 mmol/L (ref 101–111)
Creatinine, Ser: 0.78 mg/dL (ref 0.44–1.00)
GFR calc non Af Amer: >60 mL/min (ref >60)
Glucose, Bld: 133 mg/dL — ABNORMAL HIGH (ref 65–99)
POTASSIUM: 4 mmol/L (ref 3.5–5.1)
Sodium: 136 mmol/L (ref 135–145)

## 2017-11-21 LAB — CBC
HEMATOCRIT: 37 % (ref 36.0–46.0)
HEMOGLOBIN: 11.8 g/dL — AB (ref 12.0–15.0)
MCH: 29.7 pg (ref 26.0–34.0)
MCHC: 31.9 g/dL (ref 30.0–36.0)
MCV: 93.2 fL (ref 78.0–100.0)
Platelets: 198 10*3/uL (ref 150–400)
RBC: 3.97 MIL/uL (ref 3.87–5.11)
RDW: 14.1 % (ref 11.5–15.5)
WBC: 13.9 10*3/uL — ABNORMAL HIGH (ref 4.0–10.5)

## 2017-11-21 MED ORDER — HYDROCODONE-ACETAMINOPHEN 7.5-325 MG PO TABS: 1.0000 | ORAL_TABLET | ORAL | 0 refills | Status: DC | PRN

## 2017-11-21 MED ORDER — ASPIRIN 325 MG PO TABS
325.0000 mg | ORAL_TABLET | Freq: Every day | ORAL | Status: DC
  Administered 2017-11-21 – 2017-11-22 (×2): 325 mg via ORAL
  Filled 2017-11-21 (×2): qty 1

## 2017-11-21 MED ORDER — VITAMIN D (ERGOCALCIFEROL) 1.25 MG (50000 UNIT) PO CAPS: 50000.0000 [IU] | ORAL_CAPSULE | ORAL | 0 refills | Status: DC

## 2017-11-21 MED ORDER — ASPIRIN 325 MG PO TABS: 325.0000 mg | ORAL_TABLET | Freq: Every day | ORAL | Status: DC

## 2017-11-21 MED ORDER — CELECOXIB 200 MG PO CAPS: 200.0000 mg | ORAL_CAPSULE | Freq: Two times a day (BID) | ORAL | 0 refills | Status: DC

## 2017-11-21 NOTE — Discharge Instructions (Addendum)
Implant site care instructions Keep incision clean and dry for 3 days. You can remove outer dressing tomorrow. Leave steri-strips (little pieces of tape) on until seen in the office for wound check appointment. Call the office [phone]) for redness, drainage, swelling, or fever.     Transesophageal Echocardiogram Transesophageal echocardiography (TEE) is a picture test of your heart using sound waves. The pictures taken can give very detailed pictures of your heart. This can help your doctor see if there are problems with your heart. TEE can check:  If your heart has blood clots in it.  How well your heart valves are working.  If you have an infection on the inside of your heart.  Some of the major arteries of your heart.  If your heart valve is working after a Psychologist, forensic.  Your heart before a procedure that uses a shock to your heart to get the rhythm back to normal.  What happens before the procedure?  Do not eat or drink for 6 hours before the procedure or as told by your doctor.  Make plans to have someone drive you home after the procedure. Do not drive yourself home.  An IV tube will be put in your arm. What happens during the procedure?  You will be given a medicine to help you relax (sedative). It will be given through the IV tube.  A numbing medicine will be sprayed or gargled in the back of your throat to help numb it.  The tip of the probe is placed into the back of your mouth. You will be asked to swallow. This helps to pass the probe into your esophagus.  Once the tip of the probe is in the right place, your doctor can take pictures of your heart.  You may feel pressure at the back of your throat. What happens after the procedure?  You will be taken to a recovery area so the sedative can wear off.  Your throat may be sore and scratchy. This will go away slowly over time.  You will go home when you are fully awake and able to swallow liquids.  You should  have someone stay with you for the next 24 hours.  Do not drive or operate machinery for the next 24 hours. This information is not intended to replace advice given to you by your health care provider. Make sure you discuss any questions you have with your health care provider. Document Released: 07/04/2009 Document Revised: 02/12/2016 Document Reviewed: 03/08/2013 Elsevier Interactive Patient Education  2018 [employer]. OK to shower. Full weight bearing with walker. See dr. Ophelia Charter in 2 wks.    Additional discharge instructions  Please get your medications reviewed and adjusted by your Primary MD.  Please request your Primary MD to go over all Hospital Tests and Procedure/Radiological results at the follow up, please get all Hospital records sent to your Prim MD by signing hospital release before you go home.  If you had Pneumonia of Lung problems at the Hospital: Please get a 2 view Chest X ray done in 6-8 weeks after hospital discharge or sooner if instructed by your Primary MD.  If you have Congestive Heart Failure: Please call your Cardiologist or Primary MD anytime you have any of the following symptoms:  1) 3 pound weight gain in 24 hours or 5 pounds in 1 week  2) shortness of breath, with or without a dry hacking cough  3) swelling in the hands, feet or stomach  4) if you  have to sleep on extra pillows at night in order to breathe  Follow cardiac low salt diet and 1.5 lit/day fluid restriction.  If you have diabetes Accuchecks 4 times/day, Once in AM empty stomach and then before each meal. Log in all results and show them to your primary doctor at your next visit. If any glucose reading is under 80 or above 300 call your primary MD immediately.  If you have Seizure/Convulsions/Epilepsy: Please do not drive, operate heavy machinery, participate in activities at heights or participate in high speed sports until you have seen by Primary MD or a Neurologist and advised to do so  again.  If you had Gastrointestinal Bleeding: Please ask your Primary MD to check a complete blood count within one week of discharge or at your next visit. Your endoscopic/colonoscopic biopsies that are pending at the time of discharge, will also need to followed by your Primary MD.  Get Medicines reviewed and adjusted. Please take all your medications with you for your next visit with your Primary MD  Please request your Primary MD to go over all hospital tests and procedure/radiological results at the follow up, please ask your Primary MD to get all Hospital records sent to his/her office.  If you experience worsening of your admission symptoms, develop shortness of breath, life threatening emergency, suicidal or homicidal thoughts you must seek medical attention immediately by calling 911 or calling your MD immediately  if symptoms less severe.  You must read complete instructions/literature along with all the possible adverse reactions/side effects for all the Medicines you take and that have been prescribed to you. Take any new Medicines after you have completely understood and accpet all the possible adverse reactions/side effects.   Do not drive or operate heavy machinery when taking Pain medications.   Do not take more than prescribed Pain, Sleep and Anxiety Medications  Special Instructions: If you have smoked or chewed Tobacco  in the last 2 yrs please stop smoking, stop any regular Alcohol  and or any Recreational drug use.  Wear Seat belts while driving.  Please note You were cared for by a hospitalist during your hospital stay. If you have any questions about your discharge medications or the care you received while you were in the hospital after you are discharged, you can call the unit and asked to speak with the hospitalist on call if the hospitalist that took care of you is not available. Once you are discharged, your primary care physician will handle any further medical  issues. Please note that NO REFILLS for any discharge medications will be authorized once you are discharged, as it is imperative that you return to your primary care physician (or establish a relationship with a primary care physician if you do not have one) for your aftercare needs so that they can reassess your need for medications and monitor your lab values.  You can reach the hospitalist office at phone [phone] or fax [phone]   If you do not have a primary care physician, you can call [phone] for a physician referral.

## 2017-11-21 NOTE — Progress Notes (Signed)
Paged attending physician about the patient's recent episode. Doctor states he will order a CT and come and assess the patient.

## 2017-11-21 NOTE — Progress Notes (Signed)
Called 2/2 to 3 min epiosde garbled speech and thick heavy tongue Never happened before No prior cva  BP (!) 113/43   Pulse 77   Temp 97.7 F (36.5 C) (Oral)   Resp 16   Ht 5\' 1"  (1.549 m)   Wt 62.6 kg (138 lb)   SpO2 96%   BMI 26.07 kg/m    Awake alert smile symm Power 5/5 in major groups excpet RLE where surgery limits movement Vision by dir confrontation intact \\reflexes  2/3 Finger -nose-finger intact No cerebellar signs  Will get stat Ct and if neg proceed to MRI and give ASA 325---will ask neurology to come by and see dependant on CT head  Pleas KochJai Jeany [place], MD [facility] [phone](P) [phone]

## 2017-11-21 NOTE — Transfer of Care (Signed)
Immediate Anesthesia Transfer of Care Note  Patient: Christy Fletcher  Procedure(s) Performed: RIGHT HIP HEMIARTHROPLASTY (Right Hip)  Patient Location: PACU  Anesthesia Type:General  Level of Consciousness: awake, oriented, drowsy and patient cooperative  Airway & Oxygen Therapy: Patient Spontanous Breathing and Patient connected to nasal cannula oxygen  Post-op Assessment: Report to RN. VSS.   Post vital signs: Reviewed and stable  Last Vitals:  Vitals:   11/21/17 0023 11/21/17 0518  BP: (!) 111/56 102/60  Pulse: 71 69  Resp: 16 16  Temp: 36.6 C (!) 36.4 C  SpO2: 96% 97%    Last Pain:  Vitals:   11/21/17 0518  TempSrc: Oral  PainSc:       Patients Stated Pain Goal: 3 (11/20/17 0810)  Complications: No apparent anesthesia complications

## 2017-11-21 NOTE — Progress Notes (Signed)
Son came to front desk at approx. 1530 states mom had been sitting up and upon getting back to bed mom's speech was a little garbled and difficult to understand. Jeani HawkingJordana, AD assessed patient and she had good equal grips, positive dorsi and plantar flexion, and speech now clearer and easier to understand. Earlier mom complained of tongue feeling thick and now states tongue no longer feels that way. Patient smile is also equal and no drooping noted.

## 2017-11-21 NOTE — Progress Notes (Signed)
Hospitalist progress note   Christy Fletcher  ZOX:[id number] DOB: 1941/08/09 DOA: 11/19/2017 PCP: Gareth Morgan, MD   Specialists:   Carolyne Littles  Brief Narrative:  38 fem, prior Radial neck fracture 2015, osteopenia hld admitted with hip #  Assessment & Plan:   Assessment:  The primary encounter diagnosis was Fall, initial encounter. Diagnoses of Fall, Closed fracture of neck of right femur, initial encounter [facility]), Hip fracture (HCC), and S/P hip hemiarthroplasty were also pertinent to this visit.  HIp fracture 1. per discretion Ortho services-appreciate their input into   Anticoagulation post-op _DEFER TO ORTHO  Weight bearing tolerance for therapy services  Wound care  Pain management  Follow-up services required  Osteopenia-needs Dexa scan and supplementation VIt D, add Cholecalciferol  HLD-cont Mevacor 20 daily    DVT prophylaxis: per ortho  Code Status:    full   Family Communication:    None   Disposition Plan: inpatient--can go home when felt reasonable by ortho   Consultants:    ortho  Procedures:   pending  Antimicrobials:   None    Subjective:  Awake alert in nad No distress Walked 250 ft  Objective: Vitals:   11/20/17 2018 11/21/17 0023 11/21/17 0518 11/21/17 1300  BP: (!) 108/52 (!) 111/56 102/60 (!) 108/43  Pulse: 74 71 69 72  Resp: 16 16 16 16   Temp: 97.7 F (36.5 C) 97.9 F (36.6 C) (!) 97.5 F (36.4 C) 97.7 F (36.5 C)  TempSrc: Oral Oral Oral Oral  SpO2: 97% 96% 97% 100%  Weight:      Height:        Intake/Output Summary (Last 24 hours) at 11/21/2017 1448 Last data filed at 11/21/2017 1300 Gross per 24 hour  Intake 1121.25 ml  Output 1300 ml  Net -178.75 ml   Filed Weights   11/19/17 1711  Weight: 62.6 kg (138 lb)    Examination:  eomi ncat  S`1 s 2no m/r/g cta b No added sound s1 s 2no m  Data Reviewed: I have personally reviewed following labs and imaging studies  CBC: Recent Labs  Lab 11/19/17 1828  11/21/17 0704  WBC 9.5 13.9*  NEUTROABS 7.9*  --   HGB 12.9 11.8*  HCT 39.5 37.0  MCV 90.4 93.2  PLT 220 198   Basic Metabolic Panel: Recent Labs  Lab 11/19/17 1828 11/21/17 0704  NA 140 136  K 3.9 4.0  CL 105 104  CO2 26 25  GLUCOSE 104* 133*  BUN 15 15  CREATININE 0.67 0.78  CALCIUM 9.1 8.6*   GFR: Estimated Creatinine Clearance: 51.5 mL/min (by C-G formula based on SCr of 0.78 mg/dL). Liver Function Tests: No results for input(s): AST, ALT, ALKPHOS, BILITOT, PROT, ALBUMIN in the last 168 hours. No results for input(s): LIPASE, AMYLASE in the last 168 hours. No results for input(s): AMMONIA in the last 168 hours. Coagulation Profile: No results for input(s): INR, PROTIME in the last 168 hours. Cardiac Enzymes: No results for input(s): CKTOTAL, CKMB, CKMBINDEX, TROPONINI in the last 168 hours. CBG: No results for input(s): GLUCAP in the last 168 hours. Urine analysis: No results found for: COLORURINE, APPEARANCEUR, LABSPEC, PHURINE, GLUCOSEU, HGBUR, BILIRUBINUR, KETONESUR, PROTEINUR, UROBILINOGEN, NITRITE, LEUKOCYTESUR   Radiology Studies: Reviewed images personally in health database    Scheduled Meds: . calcium carbonate  1 tablet Oral Q breakfast  . celecoxib  200 mg Oral BID  . docusate sodium  100 mg Oral BID  . enoxaparin (LOVENOX) injection  40 mg  Subcutaneous Q24H  . gabapentin  300 mg Oral TID  . pravastatin  20 mg Oral q1800  . Vitamin D (Ergocalciferol)  50,000 Units Oral Q7 days   Continuous Infusions: . dextrose 5 % and 0.45% NaCl Stopped (11/20/17 1500)     LOS: 2 days    Time spent: 4115    Pleas KochJai Dyesha Henault, MD [facility] ([facility]) [phone]   If 7PM-7AM, please contact night-coverage [email or web address] Password [email or web address] 11/21/2017, 2:48 PM

## 2017-11-21 NOTE — Progress Notes (Signed)
Subjective: Patient doing well.  Pain controlled.  Patient's husband stated that she did well with therapy today.  Stated that they were told she would go home today.  . Objective: Vital signs in last 24 hours: Temp:  [97.5 F (36.4 C)-97.9 F (36.6 C)] 97.5 F (36.4 C) (03/04 0518) Pulse Rate:  [69-74] 69 (03/04 0518) Resp:  [16] 16 (03/04 0518) BP: (102-119)/(52-60) 102/60 (03/04 0518) SpO2:  [96 %-97 %] 97 % (03/04 0518)  Intake/Output from previous day: 03/03 0701 - 03/04 0700 In: 161.3 [I.V.:161.3] Out: 1600 [Urine:1450; Blood:150] Intake/Output this shift: Total I/O In: 480 [P.O.:480] Out: -   Recent Labs    11/19/17 1828 11/21/17 0704  HGB 12.9 11.8*   Recent Labs    11/19/17 1828 11/21/17 0704  WBC 9.5 13.9*  RBC 4.37 3.97  HCT 39.5 37.0  PLT 220 198   Recent Labs    11/19/17 1828 11/21/17 0704  NA 140 136  K 3.9 4.0  CL 105 104  CO2 26 25  BUN 15 15  CREATININE 0.67 0.78  GLUCOSE 104* 133*  CALCIUM 9.1 8.6*   No results for input(s): LABPT, INR in the last 72 hours.  Exam: Pleasant female alert and oriented.  NAD.  Dressing clean, dry and intact.  bilat calves nontender.  NVI.    Assessment/Plan: Advised patient and family members present that she is not ready to discharge today.  Will see how she is doing tomorrow. Continue PT.    Christy Fletcher 11/21/2017, 2:13 PM

## 2017-11-21 NOTE — Evaluation (Signed)
Physical Therapy Evaluation Patient Details Name: Christy Fletcher MRN: [id number] DOB: Aug 17, 1941 Today's Date: 11/21/2017   History of Present Illness  Pt is a 77 y.o. F with significant PMH of arthritis, diarrhea, hypercholesteremia, and osteopenia admitted with closed fracture of neck of R femur following a fall and is s/p hemiarthroplasty.   Clinical Impression  Pt admitted with above diagnosis. Pt currently with functional limitations due to the deficits listed below (see PT Problem List).  Pt demonstrates good pain control and motivation to participate with therapy. At the time of PT eval, pt is ambulating with RW and min assist > 250 feet. Main deficits include decreased balance and abnormalities of gait including left lateral lean. Will benefit from further transfer, balance, and gait training. Pt will benefit from skilled PT to increase their independence and safety with mobility to allow discharge to the venue listed below.       Follow Up Recommendations Follow surgeon's recommendation for DC plan and follow-up therapies;Outpatient PT;Supervision for mobility/OOB    Equipment Recommendations  None recommended by PT    Recommendations for Other Services OT consult     Precautions / Restrictions Precautions Precautions: Fall Restrictions Weight Bearing Restrictions: Yes      Mobility  Bed Mobility Overal bed mobility: Needs Assistance Bed Mobility: Supine to Sit     Supine to sit: Min assist;HOB elevated     General bed mobility comments: Patient requiring min assist for RLE management during supine to sit with HOB elevated and use of bed rails; increased time required.  Transfers Overall transfer level: Needs assistance Equipment used: Rolling walker (2 wheeled) Transfers: Sit to/from Stand Sit to Stand: Min assist;Mod assist;From elevated surface         General transfer comment: Initially, patient unable to perform sit to stand from lowered bed height with  mod assist. On second attempt from elevated bed height, patient able to sit to stand with min assist. VC's provided for hand and foot placement and upright posture to promote hip extension.  Ambulation/Gait Ambulation/Gait assistance: Min assist;Mod assist Ambulation Distance (Feet): 500 Feet Assistive device: Rolling walker (2 wheeled) Gait Pattern/deviations: Step-to pattern;Step-through pattern;Decreased weight shift to right   Gait velocity interpretation: Below normal speed for age/gender General Gait Details: Patient ambulated 250 ft with R knee immobilizer and following a seated break, ambulated 250 ft with no knee immobilizer using RW and min assist. Patient with improved balance and coordination without knee immobilizer (no specific order for immobilizer). Patient experienced several lateral LOB which required modA to correct. Displays left lateral lean and was able to progress to step through pattern. VCs provided for RW proximity.   Stairs            Wheelchair Mobility    Modified Rankin (Stroke Patients Only)       Balance Overall balance assessment: Needs assistance Sitting-balance support: Feet supported;Bilateral upper extremity supported Sitting balance-Leahy Scale: Fair     Standing balance support: Bilateral upper extremity supported Standing balance-Leahy Scale: Poor Standing balance comment: BUE support needed                              Pertinent Vitals/Pain Pain Assessment: No/denies pain    Home Living Family/patient expects to be discharged to:: Private residence Living Arrangements: Spouse/significant other(Patient husband with AKA and uses a wheelchair) Available Help at Discharge: Family Type of Home: Apartment Home Access: Ramped entrance     Home  Layout: One level Home Equipment: Cane - single point;Walker - 2 wheels;Shower seat      Prior Function Level of Independence: Independent         Comments: Conservation officer, [employer]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 RLE strength is grossly at least anti gravity and patient with no knee buckle during gait.     Cervical / Trunk Assessment Cervical / Trunk Assessment: Other exceptions Cervical / Trunk Exceptions: L shoulder elevated; forward head posture  Communication      Cognition Arousal/Alertness: Awake/alert Behavior During Therapy: WFL for tasks assessed/performed Overall Cognitive Status: Within Functional Limits for tasks assessed                                 General Comments: When asked about fall history, patient denies having a fall within the past month.      General Comments General comments (skin integrity, edema, etc.): Patient son present during session. Patient and son educated on use of RW with mobility for safety, supervision OOB or out of chair, and proper footwear.     Exercises General Exercises - Lower Extremity Long Arc Quad: 5 reps;Right   Assessment/Plan    PT Assessment Patient needs continued PT services  PT Problem List Decreased strength;Decreased range of motion;Decreased balance;Decreased mobility;Decreased coordination;Decreased knowledge of use of DME       PT Treatment Interventions Gait training;Balance training;Therapeutic exercise;Therapeutic activities;Functional mobility training;DME instruction;Patient/family education    PT Goals (Current goals can be found in the Care Plan section)  Acute Rehab PT Goals Patient Stated Goal: Be able to do everything I need to do at home PT Goal Formulation: With patient Time For Goal Achievement: 12/05/17 Potential to Achieve Goals: Good    Frequency Min 5X/week   Barriers to discharge Decreased caregiver support      Co-evaluation               AM-PAC PT "6 Clicks" Daily  Activity  Outcome Measure Difficulty turning over in bed (including adjusting bedclothes, sheets and blankets)?: A Lot Difficulty moving from lying on back to sitting on the side of the bed? : A Lot Difficulty sitting down on and standing up from a chair with arms (e.g., wheelchair, bedside commode, etc,.)?: Unable Help needed moving to and from a bed to chair (including a wheelchair)?: A Little Help needed walking in hospital room?: A Little Help needed climbing 3-5 steps with a railing? : A Lot 6 Click Score: 13    End of Session Equipment Utilized During Treatment: Gait belt;Right knee immobilizer Activity Tolerance: Patient tolerated treatment well Patient left: in chair;with call bell/phone within reach;with family/visitor present Nurse Communication: Mobility status PT Visit Diagnosis: Unsteadiness on feet (R26.81);History of falling (Z91.81)    Time: [id number] PT Time Calculation (min) (ACUTE ONLY): 43 min   Charges:   PT Evaluation $PT Eval Moderate Complexity: 1 Mod PT Treatments $Gait Training: 23-37 mins   PT G Codes:        Laurina Bustlearoline Shaune Malacara, PT, DPT Acute Rehabilitation Services   Vanetta MuldersCarloine H Desha Bitner 11/21/2017, 10:07 AM

## 2017-11-21 NOTE — Progress Notes (Signed)
Orthopedic Tech Progress Note Patient Details:  Christy MedinMargaret S Fletcher June 03, 1941 [id number]  Ortho Devices Ortho Device/Splint Location: Durenda AgeRAPEZE BAR   Post Interventions Patient Tolerated: Unable to use device properly   Saul FordyceJennifer C Nitasha Jewel 11/21/2017, 9:07 AM

## 2017-11-21 NOTE — Progress Notes (Signed)
Patient ID: [name], female   DOB: May 15, 1941, 77 y.o.   MRN: [id number] Hip doing well. Head CT neg after aphasia ephisode. MRI has been ordered.

## 2017-11-21 NOTE — Evaluation (Signed)
Occupational Therapy Evaluation Patient Details Name: Christy Fletcher MRN: [id number] DOB: [birth year] Today's Date: 11/21/2017    History of Present Illness Pt is a 77 y.o. F with significant PMH of arthritis, diarrhea, hypercholesteremia, and osteopenia admitted with closed fracture of neck of R femur following a fall and is s/p hemiarthroplasty.    Clinical Impression   PTA, pt was living with her husband and was independent. Pt currently performing ADLs and functional mobility at supervision-Min Guard level with RW. Educated pt on compensatory techniques for LB ADLs; pt and family verbalized understanding. Pt with single LOB at sink and required Min A for correction of balance; educated family that pt will require supervision and support during functional mobility at home. Answering all pt and family questions in preparation for dc later today. Recommend dc home once medically stable with 24 hour supervision/assistance. All acute OT needs met and will sign off.     Follow Up Recommendations  No OT follow up;Supervision/Assistance - 24 hour    Equipment Recommendations  None recommended by OT    Recommendations for Other Services PT consult     Precautions / Restrictions Precautions Precautions: Fall Restrictions Weight Bearing Restrictions: Yes RLE Weight Bearing: Weight bearing as tolerated      Mobility Bed Mobility               General bed mobility comments: Pt in recliner upon arrival  Transfers Overall transfer level: Needs assistance Equipment used: Rolling walker (2 wheeled) Transfers: Sit to/from Stand Sit to Stand: Min guard         General transfer comment: Min Guard for safety with VCs for hand placement    Balance Overall balance assessment: Needs assistance Sitting-balance support: Feet supported;Bilateral upper extremity supported Sitting balance-Leahy Scale: Good Sitting balance - Comments: Able to lean forward for LB ADLs and maitnain sitting  balance   Standing balance support: Bilateral upper extremity supported Standing balance-Leahy Scale: Fair Standing balance comment: Able to maintain standing for grooming at sink                           ADL either performed or assessed with clinical judgement   ADL Overall ADL's : Needs assistance/impaired Eating/Feeding: Set up;Sitting   Grooming: Oral care;Min guard;Minimal assistance;Standing Grooming Details (indicate cue type and reason): Pt performed oral care at sink with MIn Guard for safety. Pt with single LOB due to posterior lean. Required MIn A to correct balance and prevent fall. Educated family needs for supervision due to poor balance  Upper Body Bathing: Set up;Supervision/ safety;Sitting   Lower Body Bathing: Min guard;Sit to/from stand   Upper Body Dressing : Set up;Supervision/safety;Sitting   Lower Body Dressing: Min guard;Sit to/from stand Lower Body Dressing Details (indicate cue type and reason): Min guard for safety in standing. Educated pt on compensatory tehcniques for LB ADLs. Pt able don/doff right sock with increased time and effort Toilet Transfer: Min guard;Ambulation;RW(Simulated in room)           Functional mobility during ADLs: Min guard;Rolling walker General ADL Comments: Pt demonstrating decreased balance and is drowsy from pain medication. Able to performing ADLs and functional mobility with RW at [employer] level. Family very supportive     Vision Baseline Vision/History: Wears glasses Patient Visual Report: No change from baseline       Perception     Praxis      Pertinent Vitals/Pain Pain Assessment: No/denies pain  Hand Dominance Right   Extremity/Trunk Assessment Upper Extremity Assessment Upper Extremity Assessment: Overall WFL for tasks assessed   Lower Extremity Assessment Lower Extremity Assessment: Defer to PT evaluation;RLE deficits/detail RLE Deficits / Details: At rest, RLE is  externally rotated. RLE strength is grossly at least anti gravity and patient with no knee buckle during gait.    Cervical / Trunk Assessment Cervical / Trunk Assessment: Other exceptions Cervical / Trunk Exceptions: L shoulder elevated; forward head posture   Communication Communication Communication: No difficulties;Other (comment)(Drowsy)   Cognition Arousal/Alertness: Awake/alert Behavior During Therapy: WFL for tasks assessed/performed Overall Cognitive Status: Within Functional Limits for tasks assessed                                 General Comments: Following commands. Drowsy throughout session and benefits from VCs to maintain eyes open. Suspected due to pain medicaiton   General Comments  Husband, daughter, and son present through out session    Exercises     Shoulder Instructions      [place] expects to be discharged to:: Private residence Living Arrangements: Spouse/significant other(Patient husband with AKA and uses a wheelchair) Available Help at Discharge: Family Type of Home: Apartment Home Access: Ramped entrance     [place]: One level     Bathroom Shower/Tub: Walk-in shower(Level entry)   Bathroom Toilet: Handicapped height Bathroom Accessibility: Yes   Home Equipment: [place] - single point;Walker - 2 wheels;Shower seat;Grab bars - toilet;Grab bars - tub/shower;Hand held shower head          Prior Functioning/Environment Level of Independence: Independent        Comments: Scientist, water quality at [employer]        OT Problem List: Decreased strength;Decreased range of motion;Decreased activity tolerance;Impaired balance (sitting and/or standing);Decreased knowledge of use of DME or AE;Decreased knowledge of precautions;Pain      OT Treatment/Interventions:      OT Goals(Current goals can be found in the care plan section) Acute Rehab OT Goals Patient Stated Goal: Go home today OT Goal Formulation: All assessment and  education complete, DC therapy  OT Frequency:     Barriers to D/C:            Co-evaluation              AM-PAC PT "6 Clicks" Daily Activity     Outcome Measure Help from another person eating meals?: None Help from another person taking care of personal grooming?: A Little Help from another person toileting, which includes using toliet, bedpan, or urinal?: A Little Help from another person bathing (including washing, rinsing, drying)?: A Little Help from another person to put on and taking off regular upper body clothing?: A Little Help from another person to put on and taking off regular lower body clothing?: A Little 6 Click Score: 19   End of Session Equipment Utilized During Treatment: Gait belt;Rolling walker Nurse Communication: Mobility status;Weight bearing status  Activity Tolerance: Patient tolerated treatment well Patient left: in chair;with call bell/phone within reach;with family/visitor present  OT Visit Diagnosis: Unsteadiness on feet (R26.81);Other abnormalities of gait and mobility (R26.89);Muscle weakness (generalized) (M62.81);Pain Pain - Right/Left: Right Pain - part of body: Hip                Time: 3244-0102 OT Time Calculation (min): 32 min Charges:  OT General Charges $OT Visit: 1 Visit OT Evaluation $OT Eval Moderate Complexity: 1  Mod OT Treatments $Self Care/Home Management : 8-22 mins G-Codes:     Rhiana Morash MSOT, OTR/L Acute Rehab Pager: [phone] Office: [place] 11/21/2017, 1:10 PM

## 2017-11-22 ENCOUNTER — Inpatient Hospital Stay (HOSPITAL_COMMUNITY): Payer: Medicare HMO

## 2017-11-22 DIAGNOSIS — I6310 Cerebral infarction due to embolism of unspecified precerebral artery: Secondary | ICD-10-CM

## 2017-11-22 DIAGNOSIS — I361 Nonrheumatic tricuspid (valve) insufficiency: Secondary | ICD-10-CM

## 2017-11-22 LAB — ECHOCARDIOGRAM COMPLETE
HEIGHTINCHES: 61 in
WEIGHTICAEL: 2208 [oz_av]

## 2017-11-22 LAB — CBC
HCT: 35.4 % — ABNORMAL LOW (ref 36.0–46.0)
HEMOGLOBIN: 11.3 g/dL — AB (ref 12.0–15.0)
MCH: 30 pg (ref 26.0–34.0)
MCHC: 31.9 g/dL (ref 30.0–36.0)
MCV: 93.9 fL (ref 78.0–100.0)
Platelets: 176 10*3/uL (ref 150–400)
RBC: 3.77 MIL/uL — AB (ref 3.87–5.11)
RDW: 14.5 % (ref 11.5–15.5)
WBC: 9.7 10*3/uL (ref 4.0–10.5)

## 2017-11-22 LAB — BASIC METABOLIC PANEL
Anion gap: 6 (ref 5–15)
BUN: 20 mg/dL (ref 6–20)
CHLORIDE: 106 mmol/L (ref 101–111)
CO2: 27 mmol/L (ref 22–32)
CREATININE: 0.78 mg/dL (ref 0.44–1.00)
Calcium: 8.6 mg/dL — ABNORMAL LOW (ref 8.9–10.3)
GFR calc non Af Amer: >60 mL/min (ref >60)
Glucose, Bld: 100 mg/dL — ABNORMAL HIGH (ref 65–99)
POTASSIUM: 4 mmol/L (ref 3.5–5.1)
SODIUM: 139 mmol/L (ref 135–145)

## 2017-11-22 LAB — LIPID PANEL
Cholesterol: 139 mg/dL (ref 0–200)
HDL: 51 mg/dL (ref >40)
LDL CALC: 64 mg/dL (ref 0–99)
TRIGLYCERIDES: 122 mg/dL (ref <150)
Total CHOL/HDL Ratio: 2.7 RATIO
VLDL: 24 mg/dL (ref 0–40)

## 2017-11-22 LAB — HEMOGLOBIN A1C
Hgb A1c MFr Bld: 5.6 % (ref 4.8–5.6)
Mean Plasma Glucose: 114.02 mg/dL

## 2017-11-22 MED ORDER — CLOPIDOGREL BISULFATE 75 MG PO TABS
75.0000 mg | ORAL_TABLET | Freq: Every day | ORAL | Status: DC
  Administered 2017-11-22 – 2017-11-24 (×3): 75 mg via ORAL
  Filled 2017-11-22 (×3): qty 1

## 2017-11-22 MED ORDER — ASPIRIN EC 81 MG PO TBEC
81.0000 mg | DELAYED_RELEASE_TABLET | Freq: Every day | ORAL | Status: DC
  Administered 2017-11-22 – 2017-11-24 (×3): 81 mg via ORAL
  Filled 2017-11-22 (×3): qty 1

## 2017-11-22 NOTE — Plan of Care (Signed)
  Progressing Health Behavior/Discharge Planning: Ability to manage health-related needs will improve 11/22/2017 1910 - Progressing by [place], Marlow BaarsJordana G, RN Clinical Measurements: Ability to maintain clinical measurements within normal limits will improve 11/22/2017 1910 - Progressing by [place], Marlow BaarsJordana G, RN Will remain free from infection 11/22/2017 1910 - Progressing by [place], Marlow BaarsJordana G, RN Diagnostic test results will improve 11/22/2017 1910 - Progressing by [place], Marlow BaarsJordana G, RN Respiratory complications will improve 11/22/2017 1910 - Progressing by [place], Marlow BaarsJordana G, RN Cardiovascular complication will be avoided 11/22/2017 1910 - Progressing by [place], Marlow BaarsJordana G, RN Activity: Risk for activity intolerance will decrease 11/22/2017 1910 - Progressing by [place], Marlow BaarsJordana G, RN Nutrition: Adequate nutrition will be maintained 11/22/2017 1910 - Progressing by [place], Marlow BaarsJordana G, RN Coping: Level of anxiety will decrease 11/22/2017 1910 - Progressing by [place], Marlow BaarsJordana G, RN Elimination: Will not experience complications related to bowel motility 11/22/2017 1910 - Progressing by [place], Marlow BaarsJordana G, RN Will not experience complications related to urinary retention 11/22/2017 1910 - Progressing by [place], Marlow BaarsJordana G, RN Pain Managment: General experience of comfort will improve 11/22/2017 1910 - Progressing by [place], Marlow BaarsJordana G, RN Safety: Ability to remain free from injury will improve 11/22/2017 1910 - Progressing by [place], Marlow BaarsJordana G, RN Skin Integrity: Risk for impaired skin integrity will decrease 11/22/2017 1910 - Progressing by [place], Marlow BaarsJordana G, RN Education: Knowledge of the prescribed therapeutic regimen will improve 11/22/2017 1910 - Progressing by [place], Marlow BaarsJordana G, RN Understanding of discharge needs will  improve 11/22/2017 1910 - Progressing by [place], Marlow BaarsJordana G, RN Activity: Ability to avoid complications of mobility impairment will improve 11/22/2017 1910 - Progressing by [place], Marlow BaarsJordana G, RN Ability to tolerate increased activity will improve 11/22/2017 1910 - Progressing by [place], Marlow BaarsJordana G, RN Clinical Measurements: Postoperative complications will be avoided or minimized 11/22/2017 1910 - Progressing by [place], Marlow BaarsJordana G, RN Pain Management: Pain level will decrease with appropriate interventions 11/22/2017 1910 - Progressing by [place], Marlow BaarsJordana G, RN Skin Integrity: Signs of wound healing will improve 11/22/2017 1910 - Progressing by [place], Marlow BaarsJordana G, RN

## 2017-11-22 NOTE — Progress Notes (Signed)
  Echocardiogram 2D Echocardiogram has been performed.  Christy PartridgeBrooke S Gervase Fletcher 11/22/2017, 11:04 AM

## 2017-11-22 NOTE — Consult Note (Addendum)
Requesting Physician: Dr. Mahala Menghini    Chief Complaint: Transient dysarthria  History obtained from:  Patient     HPI:                                                                                                                                         Christy Fletcher is an 77 y.o. female who was brought to the hospital for revision of her right hip hardware.  Postoperatively she was doing well, however yesterday while she was talking to her family the afternoon-they are not sure what time-they noted that she had significant dysarthria and was unable to be understood for approximately 5 minutes.  The whole event took approximately 30 minutes and then she resolved back to normal.  Patient had no other neurological symptoms such as double vision, blurred vision, drooping of face, weakness on one side or the other.  Currently she is back to baseline.  She does not take aspirin at baseline.  She has not had a stroke in the past.  She is not diabetic as she knows.  Date last known well: Date: 11/22/2017 Time last known well: Unable to determine tPA Given: No: Symptoms resolved NIH stroke scale of 0 Modified Rankin: Rankin Score=3    Past Medical History:  Diagnosis Date  . Arthritis   . Diarrhea   . Hypercholesteremia   . Osteopenia     Past Surgical History:  Procedure Laterality Date  . ABDOMINAL HYSTERECTOMY    . CATARACT EXTRACTION    . COLONOSCOPY    . COLONOSCOPY N/A 05/14/2013   Procedure: COLONOSCOPY;  Surgeon: West Bali, MD;  Location: [facility];  Service: Endoscopy;  Laterality: N/A;  10:30 AM-rescheduled to 8:45am Doris notified pt  . HERNIA REPAIR    . HIP ARTHROPLASTY Right 11/20/2017   Procedure: RIGHT HIP HEMIARTHROPLASTY;  Surgeon: Eldred Manges, MD;  Location: [facility] OR;  Service: Orthopedics;  Laterality: Right;    Family History  Problem Relation Age of Onset  . Colon cancer Neg Hx    Social History:  reports that  has never smoked. she has never used  smokeless tobacco. She reports that she does not drink alcohol or use drugs.  Allergies: No Known Allergies  Medications:  Prior to Admission:  Medications Prior to Admission  Medication Sig Dispense Refill Last Dose  . calcium carbonate (OS-CAL) 600 MG TABS tablet Take 1,200 mg by mouth daily.   11/18/2017 at Unknown time  . lovastatin (MEVACOR) 20 MG tablet Take 20 mg by mouth at bedtime.   11/19/2017 at Unknown time   Scheduled: . aspirin  325 mg Oral Daily  . calcium carbonate  1 tablet Oral Q breakfast  . celecoxib  200 mg Oral BID  . docusate sodium  100 mg Oral BID  . enoxaparin (LOVENOX) injection  40 mg Subcutaneous Q24H  . gabapentin  300 mg Oral TID  . pravastatin  20 mg Oral q1800  . Vitamin D (Ergocalciferol)  50,000 Units Oral Q7 days   Continuous: . dextrose 5 % and 0.45% NaCl Stopped (11/20/17 1500)   ZOX:[id number]:acetaminophen, bisacodyl, HYDROcodone-acetaminophen, menthol-cetylpyridinium **OR** phenol, ondansetron **OR** ondansetron (ZOFRAN) IV, senna-docusate, sodium phosphate  ROS:                                                                                                                                       History obtained from the patient and And family  General ROS: negative for - chills, fatigue, fever, night sweats, weight gain or weight loss Psychological ROS: negative for - , hallucinations, memory difficulties, mood swings or  Ophthalmic ROS: negative for - blurry vision, double vision, eye pain or loss of vision ENT ROS: negative for - epistaxis, nasal discharge, oral lesions, sore throat, tinnitus or vertigo Respiratory ROS: negative for - cough,  shortness of breath or wheezing Cardiovascular ROS: negative for - chest pain, dyspnea on exertion,  Gastrointestinal ROS: negative for - abdominal pain, diarrhea,  nausea/vomiting or stool  incontinence Genito-Urinary ROS: negative for - dysuria, hematuria, incontinence or urinary frequency/urgency Musculoskeletal ROS: negative for - joint swelling or muscular weakness Neurological ROS: as noted in HPI   General Examination:                                                                                                      Blood pressure (!) 106/48, pulse 87, temperature 98.2 F (36.8 C), temperature source Oral, resp. rate 16, height 5\' 1"  (1.549 m), weight 62.6 kg (138 lb), SpO2 92 %.  HEENT-  Normocephalic, no lesions, without obvious abnormality.  Normal external eye and conjunctiva.   Cardiovascular- S1-S2 audible, pulses palpable throughout   Lungs-no rhonchi or wheezing noted, no excessive working breathing.  Saturations within normal limits  Abdomen- All 4 quadrants palpated and nontender Extremities- Warm, dry and intact Musculoskeletal-tenderness over the right hip along with valgus resting position of right hip Skin-warm and dry, no hyperpigmentation, vitiligo, or suspicious lesions  Neurological Examination Mental Status: Alert, oriented, thought content appropriate.  Speech fluent without evidence of aphasia.  Able to follow 3 step commands without difficulty. Cranial Nerves: II:  Visual fields grossly normal,  III,IV, VI: ptosis not present, extra-ocular motions intact bilaterally, pupils equal, round, reactive to light and accommodation V,VII: smile symmetric, facial light touch sensation normal bilaterally VIII: hearing normal bilaterally IX,X: uvula rises symmetrically XI: bilateral shoulder shrug XII: midline tongue extension Motor: Right : Upper extremity   5/5    Left:     Upper extremity   5/5  Lower extremity   3/5     Lower extremity   5/5 Slightly difficult to assess the right lower extremity secondary to recent surgery however she does have 5/5 strength with knee extension, dorsiflexion and plantarflexion Tone and bulk:normal tone throughout; no  atrophy noted Sensory: Pinprick and light touch intact throughout, bilaterally Deep Tendon Reflexes: 2+ and symmetric throughout Plantars: Right: downgoing   Left: downgoing Cerebellar: normal finger-to-nose, normal heel-to-shin test left leg Gait: Gait not tested   Lab Results: Basic Metabolic Panel: Recent Labs  Lab 11/19/17 1828 11/21/17 0704 11/22/17 0514  NA 140 136 139  K 3.9 4.0 4.0  CL 105 104 106  CO2 26 25 27   GLUCOSE 104* 133* 100*  BUN 15 15 20   CREATININE 0.67 0.78 0.78  CALCIUM 9.1 8.6* 8.6*    CBC: Recent Labs  Lab 11/19/17 1828 11/21/17 0704 11/22/17 0514  WBC 9.5 13.9* 9.7  NEUTROABS 7.9*  --   --   HGB 12.9 11.8* 11.3*  HCT 39.5 37.0 35.4*  MCV 90.4 93.2 93.9  PLT 220 198 176    Lipid Panel: Recent Labs  Lab 11/22/17 0514  CHOL 139  TRIG 122  HDL 51  CHOLHDL 2.7  VLDL 24  LDLCALC 64    CBG: No results for input(s): GLUCAP in the last 168 hours.  Imaging: Ct Head Wo Contrast  Result Date: 11/21/2017 CLINICAL DATA:  Dysphasia. Follow-up stroke. History of hypercholesterolemia. EXAM: CT HEAD WITHOUT CONTRAST TECHNIQUE: Contiguous axial images were obtained from the base of the skull through the vertex without intravenous contrast. COMPARISON:  CT HEAD November 11, 2015 FINDINGS: BRAIN: No intraparenchymal hemorrhage, mass effect nor midline shift. The ventricles and sulci are normal for age. Minimal supratentorial white matter hypodensities less than expected for patient's age, though non-specific are most compatible with chronic small vessel ischemic disease. No acute large vascular territory infarcts. No abnormal extra-axial fluid collections. Basal cisterns are patent. VASCULAR: Mild calcific atherosclerosis of the carotid siphons. SKULL: No skull fracture. 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 No significant scalp soft tissue swelling. SINUSES/ORBITS: Mild paranasal sinus mucosal thickening. No paranasal sinus air-fluid  levels. Mastoid air cells are well aerated.The included ocular globes and orbital contents are non-suspicious. Status post LEFT ocular lens implant. OTHER: None. IMPRESSION: Normal noncontrast CT HEAD for age. Electronically Signed   By: Awilda Metro M.D.   On: 11/21/2017 17:28   Mr Brain Wo Contrast  Result Date: 11/21/2017 CLINICAL DATA:  Stroke EXAM: MRI HEAD WITHOUT CONTRAST TECHNIQUE: Multiplanar, multiecho pulse sequences of the brain and surrounding structures were obtained without intravenous contrast. COMPARISON:  CT head 11/21/2017 FINDINGS: Brain: Small focus of restricted diffusion left frontal cortex over the convexity, most compatible with  acute infarct. No other areas of acute infarct Mild chronic microvascular ischemia in the white matter. Negative for mass or edema. Ventricle size normal. Several areas of chronic microhemorrhage in the cerebral cortex, bilaterally. Vascular: Normal arterial flow voids Skull and upper cervical spine: Negative Sinuses/Orbits: Mild mucosal edema paranasal sinuses. Cataract removal left. No orbital mass Other: None IMPRESSION: Small area of acute infarct left frontal cortex over the convexity. Possible embolus. Mild chronic microvascular ischemia in the white matter. Scattered areas of chronic microhemorrhage in the cerebral cortex bilaterally, probable hypertension related. Electronically Signed   By: Marlan Palau M.D.   On: 11/21/2017 19:52    Assessment and plan discussed with with attending physician and they are in agreement.    Felicie Morn PA-C [facility] [phone]  11/22/2017, 12:47 PM   Assessment: 77 y.o. female presenting to the hospital with revision of arthrodesis of her right hip.  Post surgery patient was noted to have transient dysarthria which lasted for approximately 30 minutes and now is fully resolved.  Patient does have a residual punctate infarct in the left frontal lobe.  Stroke Risk Factors -  hyperlipidemia  Recommend 1.  Hemoglobin A1c is 5.6, LDL is 64 2. MRI of the brain without contrast--as above with punctate left frontal lobe infarct 3. PT consult, OT consult, Speech consult 4. Echocardiogram--has been pending formal reading, carotid Dopplers obtained and show 1-39% within normal limits 5. Continue home statin 6. Prophylactic therapy-Antiplatelet med: Aspirin 81 mg daily and plavix 75mg  daily for 3 weeks followed by monotherapy.  7. Risk factor modification 8. Telemetry monitoring 9. Frequent neuro checks 10 has past the stroke swallow screen at this time 11 please page stroke NP  Or  PA  Or MD from 8am -4 pm  as this patient from this time will be  followed by the stroke.   You can look them up on [email or web address]  Password [email or web address]   Ritta Slot, MD [facility] [phone]  If 7pm- 7am, please page neurology on call as listed in AMION.

## 2017-11-22 NOTE — Care Management Note (Signed)
Case Management Note  Patient Details  Name: Christy Fletcher MRN: [id number] Date of Birth: 01/31/1941  Subjective/Objective:   77 yr old female s/p right hip hemiarthroplasty.                  Action/Plan: Case manager spke with patient's family concerning discharge plan. (she was off unit). Choice for home health agency was offered to Husband and son. Referral was called to [facility], advanced Home care Liaison. Patient will have family support at discharge.    Expected Discharge Date:  11/21/17               Expected Discharge Plan:  Home w Home Health Services  In-House Referral:  NA  Discharge planning Services  CM Consult  Post Acute Care Choice:  Home Health Choice offered to:  Adult Children, Spouse  DME Arranged:  (has all DME , home is handicap accessible) DME Agency:  NA  HH Arranged:  PT HH Agency:  [facility]  Status of Service:  Completed, signed off  If discussed at Long Length of Stay Meetings, dates discussed:    Additional Comments:  Durenda GuthrieBrady, Canda Podgorski Naomi, RN 11/22/2017, 11:36 AM

## 2017-11-22 NOTE — Progress Notes (Signed)
1030-  Patient does not have a foley.  She is unsure as to when it was d/ced.

## 2017-11-22 NOTE — Progress Notes (Signed)
  Echocardiogram 2D Echocardiogram has been performed.  Christy PartridgeBrooke S Adetokunbo Mccadden 11/22/2017, 11:09 AM

## 2017-11-22 NOTE — Progress Notes (Signed)
Physical Therapy Treatment Patient Details Name: Christy Fletcher MRN: [id number] DOB: December 30, 1940 Today's Date: 11/22/2017    History of Present Illness Pt is a 77 y.o. F with significant PMH of arthritis, diarrhea, hypercholesteremia, and osteopenia admitted with closed fracture of neck of R femur following a fall and is s/p hemiarthroplasty.     PT Comments    Patient with acute episode of dysphagia yesterday. MRI Impression showed small area of acute infarct left frontal cortex over the convexity. Patient strength and sensation WFL in today's session. Patient did complain of slight "wooziness" in supine which did not worsen with change in position; BP 110/49. Patient demonstrated improvement in balance with gait, only requiring min guard ambulating > 500 feet with RW and one standing break. Does display decrease in gait speed with dynamic gait and would benefit from further balance training to ensure a safe transition home.    Follow Up Recommendations  Follow surgeon's recommendation for DC plan and follow-up therapies;Outpatient PT;Supervision for mobility/OOB     Equipment Recommendations  None recommended by PT    Recommendations for Other Services       Precautions / Restrictions Precautions Precautions: Fall Restrictions Weight Bearing Restrictions: Yes RLE Weight Bearing: Weight bearing as tolerated    Mobility  Bed Mobility Overal bed mobility: Needs Assistance Bed Mobility: Supine to Sit     Supine to sit: Supervision;HOB elevated        Transfers Overall transfer level: Needs assistance Equipment used: Rolling walker (2 wheeled) Transfers: Sit to/from Stand Sit to Stand: Min guard         General transfer comment: Upon first attempt, pt unable to complete full upright standing due to difficulty obtaining hip extension; patient able to achieve upright standing on second attempt with min guard assist and RW.  Ambulation/Gait Ambulation/Gait assistance:  Min guard Ambulation Distance (Feet): 500 Feet Assistive device: Rolling walker (2 wheeled) Gait Pattern/deviations: Step-through pattern;Decreased weight shift to right   Gait velocity interpretation: Below normal speed for age/gender General Gait Details: Patient with improved balance and no overt LOB this session as well as step through pattern and increased gait speed. One standing rest break for ~1 minute required. For ~150 feet, patient practiced dynamic gait with head turns up, down, left and right. Patient with decreased gait speed during dynamic gait activities.   Stairs            Wheelchair Mobility    Modified Rankin (Stroke Patients Only)       Balance Overall balance assessment: Needs assistance Sitting-balance support: Feet supported;Bilateral upper extremity supported Sitting balance-Leahy Scale: Good Sitting balance - Comments: Able to lean forward for LB ADLs and maitnain sitting balance   Standing balance support: Bilateral upper extremity supported Standing balance-Leahy Scale: Fair               High level balance activites: Head turns;Other (comment) High Level Balance Comments: Patient able to stand statically with feet apart and no hand support x 30 seconds. [place] - eyes opened for 30 seconds with assist obtaining position. Patient unable to achieve marching in place due to 2 LOB.             Cognition Arousal/Alertness: Awake/alert Behavior During Therapy: WFL for tasks assessed/performed Overall Cognitive Status: Within Functional Limits for tasks assessed  General Comments: Following commands. Drowsy throughout session and benefits from VCs to maintain eyes open. Suspected due to pain medicaiton      Exercises      General Comments General comments (skin integrity, edema, etc.): Son present throughout session. Sensation and strength for BUE/BLE were reassessed and [facility]       Pertinent Vitals/Pain Pain Assessment: No/denies pain    Home Living                      Prior Function            PT Goals (current goals can now be found in the care plan section) Acute Rehab PT Goals Patient Stated Goal: Go home today PT Goal Formulation: With patient Time For Goal Achievement: 12/05/17 Potential to Achieve Goals: Good    Frequency    Min 5X/week      PT Plan Current plan remains appropriate    Co-evaluation              AM-PAC PT "6 Clicks" Daily Activity  Outcome Measure  Difficulty turning over in bed (including adjusting bedclothes, sheets and blankets)?: A Little Difficulty moving from lying on back to sitting on the side of the bed? : A Little Difficulty sitting down on and standing up from a chair with arms (e.g., wheelchair, bedside commode, etc,.)?: A Lot Help needed moving to and from a bed to chair (including a wheelchair)?: A Little Help needed walking in hospital room?: A Little Help needed climbing 3-5 steps with a railing? : A Lot 6 Click Score: 16    End of Session Equipment Utilized During Treatment: Gait belt Activity Tolerance: Patient tolerated treatment well Patient left: in chair;with call bell/phone within reach;with family/visitor present   PT Visit Diagnosis: Unsteadiness on feet (R26.81);History of falling (Z91.81)     Time: [id number] PT Time Calculation (min) (ACUTE ONLY): 27 min  Charges:  $Gait Training: 23-37 mins                    G Codes:       Christy Bustlearoline Minka Knight, PT, DPT Acute Rehabilitation Services   Christy MuldersCarloine H Michaiah Fletcher 11/22/2017, 8:38 AM

## 2017-11-22 NOTE — Progress Notes (Signed)
   Subjective: 2 Days Post-Op Procedure(s) (LRB): RIGHT HIP HEMIARTHROPLASTY (Right) Patient reports pain as mild.    Objective: Vital signs in last 24 hours: Temp:  [97.7 F (36.5 C)-98.2 F (36.8 C)] 98.2 F (36.8 C) (03/05 0442) Pulse Rate:  [72-88] 87 (03/05 0442) Resp:  [16] 16 (03/04 1300) BP: (106-118)/(43-48) 106/48 (03/05 0442) SpO2:  [92 %-100 %] 92 % (03/05 0442)  Intake/Output from previous day: 03/04 0701 - 03/05 0700 In: 1200 [P.O.:1200] Out: -  Intake/Output this shift: No intake/output data recorded.  Recent Labs    11/19/17 1828 11/21/17 0704 11/22/17 0514  HGB 12.9 11.8* 11.3*   Recent Labs    11/21/17 0704 11/22/17 0514  WBC 13.9* 9.7  RBC 3.97 3.77*  HCT 37.0 35.4*  PLT 198 176   Recent Labs    11/21/17 0704 11/22/17 0514  NA 136 139  K 4.0 4.0  CL 104 106  CO2 25 27  BUN 15 20  CREATININE 0.78 0.78  GLUCOSE 133* 100*  CALCIUM 8.6* 8.6*   No results for input(s): LABPT, INR in the last 72 hours.  Neurologically intact Ct Head Wo Contrast  Result Date: 11/21/2017 CLINICAL DATA:  Dysphasia. Follow-up stroke. History of hypercholesterolemia. EXAM: CT HEAD WITHOUT CONTRAST TECHNIQUE: Contiguous axial images were obtained from the base of the skull through the vertex without intravenous contrast. COMPARISON:  CT HEAD November 11, 2015 FINDINGS: BRAIN: No intraparenchymal hemorrhage, mass effect nor midline shift. The ventricles and sulci are normal for age. Minimal supratentorial white matter hypodensities less than expected for patient's age, though non-specific are most compatible with chronic small vessel ischemic disease. No acute large vascular territory infarcts. No abnormal extra-axial fluid collections. Basal cisterns are patent. VASCULAR: Mild calcific atherosclerosis of the carotid siphons. SKULL: No skull fracture. 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 No significant scalp soft tissue swelling. SINUSES/ORBITS: Mild  paranasal sinus mucosal thickening. No paranasal sinus air-fluid levels. Mastoid air cells are well aerated.The included ocular globes and orbital contents are non-suspicious. Status post LEFT ocular lens implant. OTHER: None. IMPRESSION: Normal noncontrast CT HEAD for age. Electronically Signed   By: Awilda Metroourtnay  Bloomer M.D.   On: 11/21/2017 17:28   Mr Brain Wo Contrast  Result Date: 11/21/2017 CLINICAL DATA:  Stroke EXAM: MRI HEAD WITHOUT CONTRAST TECHNIQUE: Multiplanar, multiecho pulse sequences of the brain and surrounding structures were obtained without intravenous contrast. COMPARISON:  CT head 11/21/2017 FINDINGS: Brain: Small focus of restricted diffusion left frontal cortex over the convexity, most compatible with acute infarct. No other areas of acute infarct Mild chronic microvascular ischemia in the white matter. Negative for mass or edema. Ventricle size normal. Several areas of chronic microhemorrhage in the cerebral cortex, bilaterally. Vascular: Normal arterial flow voids Skull and upper cervical spine: Negative Sinuses/Orbits: Mild mucosal edema paranasal sinuses. Cataract removal left. No orbital mass Other: None IMPRESSION: Small area of acute infarct left frontal cortex over the convexity. Possible embolus. Mild chronic microvascular ischemia in the white matter. Scattered areas of chronic microhemorrhage in the cerebral cortex bilaterally, probable hypertension related. Electronically Signed   By: Marlan Palauharles  Clark M.D.   On: 11/21/2017 19:52    Assessment/Plan: 2 Days Post-Op Procedure(s) (LRB): RIGHT HIP HEMIARTHROPLASTY (Right) Up with therapy Brain MRI shows frontal area abnormality/infarct.  Dressing change hip ordered.  Eldred MangesMark C Rylin Seavey 11/22/2017, 7:19 AM

## 2017-11-22 NOTE — Progress Notes (Signed)
Hospitalist progress note   Christy Fletcher  WUJ:[id number] DOB: [birth year] DOA: 11/19/2017 PCP: Gareth MorganKnowlton, Steve, MD   Specialists:   Carolyne LittlesYates Fletcher  Brief Narrative:  [age] fem, prior Radial neck fracture 2015, osteopenia hld admitted with hip #  Assessment & Plan:   Assessment:  The primary encounter diagnosis was Fall, initial encounter. Diagnoses of Fall, Closed fracture of neck of right femur, initial encounter [facility](HCC), Hip fracture (HCC), and S/P hip hemiarthroplasty were also pertinent to this visit.  HIp fracture 1. per discretion Fletcher services-appreciate their input into   Anticoagulation post-op _DEFER TO Fletcher  Weight bearing tolerance for therapy services  Wound care  Pain management  Follow-up services required  Small stroke CT neg but MRI + Getting carotids and echo and neuro input pending--adding a1c, lipid  Osteopenia-needs Dexa scan and supplementation VIt D, add Cholecalciferol  HLD-cont Mevacor 20 daily    DVT prophylaxis: per Fletcher  Code Status:    full   Family Communication:    None   Disposition Plan: inpatient--await neuro input and work-up for cva   Consultants:    Fletcher  Procedures:   pending  Antimicrobials:   None    Subjective:  Fair  No recurrence of weakness or slurred speech No cp No other issue  Objective: Vitals:   11/21/17 1300 11/21/17 1539 11/21/17 2100 11/22/17 0442  BP: (!) 108/43 (!) 113/43 (!) 118/47 (!) 106/48  Pulse: 72 77 88 87  Resp: 16     Temp: 97.7 F (36.5 C)  97.7 F (36.5 C) 98.2 F (36.8 C)  TempSrc: Oral  Oral Oral  SpO2: 100% 96% 93% 92%  Weight:      Height:        Intake/Output Summary (Last 24 hours) at 11/22/2017 1001 Last data filed at 11/22/2017 0900 Gross per 24 hour  Intake 960 ml  Output -  Net 960 ml   Filed Weights   11/19/17 1711  Weight: 62.6 kg (138 lb)    Examination:  eomi ncat  S`1 s 2no m/r/g cta b No added sound s1 s 2no m Smile symm, power 5/5 reflexes  defer eomi and tracks equally  Data Reviewed: I have personally reviewed following labs and imaging studies  CBC: Recent Labs  Lab 11/19/17 1828 11/21/17 0704 11/22/17 0514  WBC 9.5 13.9* 9.7  NEUTROABS 7.9*  --   --   HGB 12.9 11.8* 11.3*  HCT 39.5 37.0 35.4*  MCV 90.4 93.2 93.9  PLT 220 198 176   Basic Metabolic Panel: Recent Labs  Lab 11/19/17 1828 11/21/17 0704 11/22/17 0514  NA 140 136 139  K 3.9 4.0 4.0  CL 105 104 106  CO2 26 25 27   GLUCOSE 104* 133* 100*  BUN 15 15 20   CREATININE 0.67 0.78 0.78  CALCIUM 9.1 8.6* 8.6*   GFR: Estimated Creatinine Clearance: 51.5 mL/min (by C-G formula based on SCr of 0.78 mg/dL). Liver Function Tests: No results for input(s): AST, ALT, ALKPHOS, BILITOT, PROT, ALBUMIN in the last 168 hours. No results for input(s): LIPASE, AMYLASE in the last 168 hours. No results for input(s): AMMONIA in the last 168 hours. Coagulation Profile: No results for input(s): INR, PROTIME in the last 168 hours. Cardiac Enzymes: No results for input(s): CKTOTAL, CKMB, CKMBINDEX, TROPONINI in the last 168 hours. CBG: No results for input(s): GLUCAP in the last 168 hours. Urine analysis: No results found for: COLORURINE, APPEARANCEUR, LABSPEC, PHURINE, GLUCOSEU, HGBUR, BILIRUBINUR, KETONESUR, PROTEINUR, UROBILINOGEN, NITRITE, LEUKOCYTESUR  Radiology Studies: Reviewed images personally in health database    Scheduled Meds: . aspirin  325 mg Oral Daily  . calcium carbonate  1 tablet Oral Q breakfast  . celecoxib  200 mg Oral BID  . docusate sodium  100 mg Oral BID  . enoxaparin (LOVENOX) injection  40 mg Subcutaneous Q24H  . gabapentin  300 mg Oral TID  . pravastatin  20 mg Oral q1800  . Vitamin D (Ergocalciferol)  50,000 Units Oral Q7 days   Continuous Infusions: . dextrose 5 % and 0.45% NaCl Stopped (11/20/17 1500)     LOS: 3 days    Time spent: 77    Christy Koch, MD [facility] ([facility]   If 7PM-7AM, please  contact night-coverage [email or web address] Password [email or web address] 11/22/2017, 10:01 AM

## 2017-11-22 NOTE — Progress Notes (Signed)
*  PRELIMINARY RESULTS* Vascular Ultrasound Carotid Duplex (Doppler) has been completed.   Findings suggest 1-39% internal carotid artery stenosis bilaterally. Vertebral arteries are patent with antegrade flow.  11/22/2017 10:59 AM Gertie FeyMichelle Kriston Pasquarello, BS, RVT, RDCS, RDMS

## 2017-11-23 ENCOUNTER — Inpatient Hospital Stay (HOSPITAL_COMMUNITY): Payer: Medicare HMO

## 2017-11-23 ENCOUNTER — Encounter (HOSPITAL_COMMUNITY): Payer: Self-pay | Admitting: Radiology

## 2017-11-23 DIAGNOSIS — S72001D Fracture of unspecified part of neck of right femur, subsequent encounter for closed fracture with routine healing: Secondary | ICD-10-CM

## 2017-11-23 DIAGNOSIS — I6310 Cerebral infarction due to embolism of unspecified precerebral artery: Secondary | ICD-10-CM

## 2017-11-23 DIAGNOSIS — I6340 Cerebral infarction due to embolism of unspecified cerebral artery: Secondary | ICD-10-CM

## 2017-11-23 DIAGNOSIS — E785 Hyperlipidemia, unspecified: Secondary | ICD-10-CM

## 2017-11-23 LAB — CBC
HCT: 35.1 % — ABNORMAL LOW (ref 36.0–46.0)
Hemoglobin: 11.1 g/dL — ABNORMAL LOW (ref 12.0–15.0)
MCH: 29.4 pg (ref 26.0–34.0)
MCHC: 31.6 g/dL (ref 30.0–36.0)
MCV: 93.1 fL (ref 78.0–100.0)
PLATELETS: 207 10*3/uL (ref 150–400)
RBC: 3.77 MIL/uL — ABNORMAL LOW (ref 3.87–5.11)
RDW: 14.1 % (ref 11.5–15.5)
WBC: 9.4 10*3/uL (ref 4.0–10.5)

## 2017-11-23 MED ORDER — SODIUM CHLORIDE 0.9 % IV SOLN
INTRAVENOUS | Status: DC
  Administered 2017-11-24: 13:00:00 via INTRAVENOUS

## 2017-11-23 MED ORDER — IOPAMIDOL (ISOVUE-370) INJECTION 76%
INTRAVENOUS | Status: AC
  Administered 2017-11-23: 50 mL
  Filled 2017-11-23: qty 50

## 2017-11-23 NOTE — Progress Notes (Signed)
Physical Therapy Treatment Patient Details Name: Christy MedinMargaret S Fletcher MRN: [id number] DOB: Feb 07, 1941 Today's Date: 11/23/2017    History of Present Illness Pt is a 77 y.o. F with significant PMH of arthritis, diarrhea, hypercholesteremia, and osteopenia admitted with closed fracture of neck of R femur following a fall and is s/p hemiarthroplasty.     PT Comments    Patient is progressing very well towards their physical therapy goals. Performed stair training today with bilateral rails and min guard for strengthening purposes. Patient and family educated extensively on home safety to reduce fall risk and car transfer to prepare for transition home; they verbalized understanding. Patient instructed and provided written HEP for RLE strengthening.     Follow Up Recommendations  Follow surgeon's recommendation for DC plan and follow-up therapies;Outpatient PT;Supervision for mobility/OOB     Equipment Recommendations  None recommended by PT    Recommendations for Other Services       Precautions / Restrictions Precautions Precautions: Fall Restrictions Weight Bearing Restrictions: Yes RLE Weight Bearing: Weight bearing as tolerated    Mobility  Bed Mobility Overal bed mobility: Modified Independent Bed Mobility: Sit to Supine       Sit to supine: HOB elevated;Modified independent (Device/Increase time)      Transfers Overall transfer level: Needs assistance Equipment used: Rolling walker (2 wheeled) Transfers: Sit to/from Stand Sit to Stand: Supervision         General transfer comment: Patient with increased trunk flexion with sit to stand and provided VC's for upright posture. Mod VC's for hand positioning on stable surface.   Ambulation/Gait Ambulation/Gait assistance: Min guard Ambulation Distance (Feet): 200 Feet Assistive device: Rolling walker (2 wheeled) Gait Pattern/deviations: Step-through pattern;Decreased weight shift to right   Gait velocity  interpretation: Below normal speed for age/gender General Gait Details: VC's for RW proximity   Stairs Stairs: Yes   Stair Management: Two rails;Forwards;Step to pattern Number of Stairs: 3 General stair comments: Patient provided verbal instruction for sequencing pattern.  Wheelchair Mobility    Modified Rankin (Stroke Patients Only)       Balance Overall balance assessment: Needs assistance Sitting-balance support: Feet supported;Bilateral upper extremity supported Sitting balance-Leahy Scale: Good Sitting balance - Comments: Able to lean forward for LB ADLs and maitnain sitting balance   Standing balance support: Bilateral upper extremity supported Standing balance-Leahy Scale: Fair                              Cognition Arousal/Alertness: Awake/alert Behavior During Therapy: WFL for tasks assessed/performed Overall Cognitive Status: Within Functional Limits for tasks assessed                                 General Comments: Following commands. Drowsy throughout session and benefits from VCs to maintain eyes open. Suspected due to pain medicaiton      Exercises General Exercises - Lower Extremity Long Arc Quad: 15 reps;Both Heel Slides: 10 reps;Both Hip ABduction/ADduction: 10 reps;Both Hip Flexion/Marching: Both;10 reps;Supine    General Comments General comments (skin integrity, edema, etc.): Patient husband and daughter present throughout session. Patient and family educated on home safety including wearing socks/shoes with traction, removing throw rugs, and negotiating tight spaces in the home by side stepping with the RW. Also instructed on neutral positioning with pillows for the RLE. PT demonstrated and patient performed a simulated car transfer from an elevated surface.  Pertinent Vitals/Pain Pain Assessment: No/denies pain    Home Living                      Prior Function            PT Goals (current goals  can now be found in the care plan section) Acute Rehab PT Goals Patient Stated Goal: Go home today PT Goal Formulation: With patient Time For Goal Achievement: 12/05/17 Potential to Achieve Goals: Good Progress towards PT goals: Progressing toward goals    Frequency    Min 5X/week      PT Plan Current plan remains appropriate    Co-evaluation              AM-PAC PT "6 Clicks" Daily Activity  Outcome Measure  Difficulty turning over in bed (including adjusting bedclothes, sheets and blankets)?: A Little Difficulty moving from lying on back to sitting on the side of the bed? : A Little Difficulty sitting down on and standing up from a chair with arms (e.g., wheelchair, bedside commode, etc,.)?: A Lot Help needed moving to and from a bed to chair (including a wheelchair)?: A Little Help needed walking in hospital room?: A Little Help needed climbing 3-5 steps with a railing? : A Little 6 Click Score: 17    End of Session Equipment Utilized During Treatment: Gait belt Activity Tolerance: Patient tolerated treatment well Patient left: with call bell/phone within reach;with family/visitor present;in bed   PT Visit Diagnosis: Unsteadiness on feet (R26.81);History of falling (Z91.81)     Time: 1610-9604 PT Time Calculation (min) (ACUTE ONLY): 52 min  Charges:  $Gait Training: 8-22 mins $Therapeutic Exercise: 8-22 mins $Therapeutic Activity: 8-22 mins                    G Codes:       Laurina Bustle, PT, DPT Acute Rehabilitation Services     Vanetta Mulders 11/23/2017, 3:12 PM

## 2017-11-23 NOTE — Progress Notes (Signed)
    [facility] has been requested to perform a transesophageal echocardiogram on Christy Fletcher for stroke.  After careful review of history and examination, the risks and benefits of transesophageal echocardiogram have been explained including risks of esophageal damage, perforation (1:10,000 risk), bleeding, pharyngeal hematoma as well as other potential complications associated with conscious sedation including aspiration, arrhythmia, respiratory failure and death. Alternatives to treatment were discussed, questions were answered. Patient is willing to proceed.   Pt is scheduled for TEE tomorrow 11/24/17 at 1500 with Dr. Shirlee LatchMcLean. NPO at MN.  Roe Rutherfordngela Nicole Duke, [place]  11/23/2017 4:18 PM

## 2017-11-23 NOTE — H&P (View-Only) (Signed)
PROGRESS NOTE   Christy Fletcher  ZOX:[id number]    DOB: July 07, 1941    DOA: 11/19/2017  PCP: Gareth Morgan, MD   I have briefly reviewed patients previous medical records in [facility]  Brief Narrative:  77 year old female with PMH of osteopenia, HLD, initially presented to [facility] ED due to severe right hip pain after mechanical fall at home.  She underwent right hip hemiarthroplasty 11/20/17.  Postop course was complicated by few minutes of garbled speech which quickly resolved and workup revealed acute stroke.  Patient was transferred to [facility] for evaluation by neurology/stroke service.   Assessment & Plan:   Principal Problem:   Hip fracture (HCC) Active Problems:   Osteopenia   Arthritis   Dyslipidemia   Cerebral embolism with cerebral infarction   Acute stroke: Neurology consultation and follow-up appreciated.  Completing stroke workup.  MRI brain: Left frontal cortical infarct and language area.  Code stroke CT head normal.  Stroke felt to be embolic secondary to unknown source, concern for A. fib but patient was not on telemetry during the event.  Telemetry thus far shows sinus rhythm.  Resultant transient aphasia has resolved.  Carotid Dopplers: Bilateral ICA 1-39% stenosis, vertebral artery flow antegrade.  CTA head and neck: Appears unremarkable.  2D echo: LVEF 60-65%.  LDL 64.  A1c 5.6.  TEE and loop recorder planned for 3/7.  Not on antithrombotics prior to admission.  Now on aspirin 81 mg + clopidogrel 75 mg daily, continue DAPT for 3 weeks then Plavix alone.  Outpatient PT for right hip.  Hyperlipidemia: LDL 65, goal <70.  Resume Mevacor at time of discharge.  Right hip fracture, s/p right hip hemiarthroplasty 11/20/17: Outpatient PT.  Follow-up with orthopedics.  Osteopenia: Outpatient follow-up regarding DEXA scan   DVT prophylaxis: Lovenox Code Status: Full Family Communication: Discussed in detail with patient's son at bedside.  He is known to me,  works with [employer]. Disposition: DC home possibly 11/24/17 pending completion of stroke workup.   Consultants:  Neurology Orthopedics  Procedures:  As above  Antimicrobials:  None   Subjective: Denies complaints.  Appropriate postop right hip pain.  Garbled speech resolved without recurrence.  No other strokelike symptoms.  ROS: As above.  Objective:  Vitals:   11/22/17 1500 11/22/17 1956 11/23/17 0551 11/23/17 1421  BP: (!) 116/44 (!) 105/58 121/63 (!) 129/53  Pulse: (!) 102 88 73 94  Resp: 18 17 17 17   Temp: 98 F (36.7 C) 98.4 F (36.9 C) 98 F (36.7 C) 98.5 F (36.9 C)  TempSrc: Oral Oral Oral Oral  SpO2: 96% 98% 93% 98%  Weight:      Height:        Examination:  General exam: Pleasant elderly female, moderately built and nourished, lying comfortably propped up in bed. Respiratory system: Clear to auscultation. Respiratory effort normal. Cardiovascular system: S1 & S2 heard, RRR. No JVD, murmurs, rubs, gallops or clicks. No pedal edema.  Telemetry: Sinus rhythm. Gastrointestinal system: Abdomen is nondistended, soft and nontender. No organomegaly or masses felt. Normal bowel sounds heard. Central nervous system: Alert and oriented. No focal neurological deficits. Extremities: Symmetric 5 x 5 power.  Right hip postop dressing clean and dry. Skin: No rashes, lesions or ulcers Psychiatry: Judgement and insight appear normal. Mood & affect appropriate.     Data Reviewed: I have personally reviewed following labs and imaging studies  CBC: Recent Labs  Lab 11/19/17 1828 11/21/17 0704 11/22/17 0514 11/23/17 0853  WBC 9.5  13.9* 9.7 9.4  NEUTROABS 7.9*  --   --   --   HGB 12.9 11.8* 11.3* 11.1*  HCT 39.5 37.0 35.4* 35.1*  MCV 90.4 93.2 93.9 93.1  PLT 220 198 176 207   Basic Metabolic Panel: Recent Labs  Lab 11/19/17 1828 11/21/17 0704 11/22/17 0514  NA 140 136 139  K 3.9 4.0 4.0  CL 105 104 106  CO2 26 25 27   GLUCOSE 104* 133* 100*  BUN 15 15 20    CREATININE 0.67 0.78 0.78  CALCIUM 9.1 8.6* 8.6*   HbA1C: Recent Labs    11/22/17 0514  HGBA1C 5.6   CBG: No results for input(s): GLUCAP in the last 168 hours.  Recent Results (from the past 240 hour(s))  MRSA PCR Screening     Status: None   Collection Time: 11/20/17  5:37 AM  Result Value Ref Range Status   MRSA by PCR NEGATIVE NEGATIVE Final    Comment:        The GeneXpert MRSA Assay (FDA approved for NASAL specimens only), is one component of a comprehensive MRSA colonization surveillance program. It is not intended to diagnose MRSA infection nor to guide or monitor treatment for MRSA infections. Performed at [facility], [address], [place], [place] [address]          Radiology Studies: Ct Angio Head W Or Wo Contrast  Result Date: 11/23/2017 CLINICAL DATA:  Stroke follow-up EXAM: CT ANGIOGRAPHY HEAD AND NECK TECHNIQUE: Multidetector CT imaging of the head and neck was performed using the standard protocol during bolus administration of intravenous contrast. Multiplanar CT image reconstructions and MIPs were obtained to evaluate the vascular anatomy. Carotid stenosis measurements (when applicable) are obtained utilizing NASCET criteria, using the distal internal carotid diameter as the denominator. CONTRAST:  50mL ISOVUE-370 IOPAMIDOL (ISOVUE-370) INJECTION 76% COMPARISON:  Brain MRI from 2 days ago. FINDINGS: CT HEAD FINDINGS Brain: Tiny cortical infarct in the left frontal lobe on comparison MRI is not detectable by CT. No hemorrhage or visible infarct progression. Age normal appearance of the brain. Vascular: See below Skull: Negative Sinuses: Mucosal thickening and right-sided ethmoid air cells. Orbits: Negative other than left cataract resection. Review of the MIP images confirms the above findings CTA NECK FINDINGS Aortic arch: Unremarkable.  Three vessel branching. Right carotid system: Vessels are smooth and widely patent. There is mild tortuosity. No  atheromatous changes. Left carotid system: Vessels are widely patent. Tortuosity with mild kinking which accounts for subtle lobulation of the lumen at the level of the ICA, accentuated by artifact from dental amalgam. No convincing beading. No dissection flap. Minimal atherosclerotic plaque at the distal common carotid. Vertebral arteries: No proximal subclavian stenosis. Left dominant vertebral artery. Both vertebral arteries are smooth when accounting for artifact from dental amalgam. No vertebral stenosis. Skeleton: Advanced and generalized cervical disc degeneration. Other neck: No incidental mass or inflammation. Upper chest: Negative Review of the MIP images confirms the above findings CTA HEAD FINDINGS Anterior circulation: Atherosclerotic plaque to a mild degree on the carotid siphons. No major branch occlusion or flow limiting stenosis. Negative for aneurysm. Posterior circulation: Left vertebral artery dominance. Vertebral and basilar arteries are smooth and diffusely patent. Aplastic left P1 segment with fetal type PCA. Negative for aneurysm. No flow limiting stenosis. Venous sinuses: Patent Anatomic variants: As above Delayed phase: No abnormal intracranial enhancement. Review of the MIP images confirms the above findings IMPRESSION: 1. No emergent finding. No flow limiting stenosis or embolic source identified  in the head or neck. 2. Very mild atherosclerosis. 3. Known small left frontal cortex infarct that is not detectable by CT. No hemorrhage or visible progression. Electronically Signed   By: Marnee Spring M.D.   On: 11/23/2017 11:44   Ct Head Wo Contrast  Result Date: 11/21/2017 CLINICAL DATA:  Dysphasia. Follow-up stroke. History of hypercholesterolemia. EXAM: CT HEAD WITHOUT CONTRAST TECHNIQUE: Contiguous axial images were obtained from the base of the skull through the vertex without intravenous contrast. COMPARISON:  CT HEAD November 11, 2015 FINDINGS: BRAIN: No intraparenchymal  hemorrhage, mass effect nor midline shift. The ventricles and sulci are normal for age. Minimal supratentorial white matter hypodensities less than expected for patient's age, though non-specific are most compatible with chronic small vessel ischemic disease. No acute large vascular territory infarcts. No abnormal extra-axial fluid collections. Basal cisterns are patent. VASCULAR: Mild calcific atherosclerosis of the carotid siphons. SKULL: No skull fracture. 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 No significant scalp soft tissue swelling. SINUSES/ORBITS: Mild paranasal sinus mucosal thickening. No paranasal sinus air-fluid levels. Mastoid air cells are well aerated.The included ocular globes and orbital contents are non-suspicious. Status post LEFT ocular lens implant. OTHER: None. IMPRESSION: Normal noncontrast CT HEAD for age. Electronically Signed   By: Awilda Metro M.D.   On: 11/21/2017 17:28   Ct Angio Neck W Or Wo Contrast  Result Date: 11/23/2017 CLINICAL DATA:  Stroke follow-up EXAM: CT ANGIOGRAPHY HEAD AND NECK TECHNIQUE: Multidetector CT imaging of the head and neck was performed using the standard protocol during bolus administration of intravenous contrast. Multiplanar CT image reconstructions and MIPs were obtained to evaluate the vascular anatomy. Carotid stenosis measurements (when applicable) are obtained utilizing NASCET criteria, using the distal internal carotid diameter as the denominator. CONTRAST:  50mL ISOVUE-370 IOPAMIDOL (ISOVUE-370) INJECTION 76% COMPARISON:  Brain MRI from 2 days ago. FINDINGS: CT HEAD FINDINGS Brain: Tiny cortical infarct in the left frontal lobe on comparison MRI is not detectable by CT. No hemorrhage or visible infarct progression. Age normal appearance of the brain. Vascular: See below Skull: Negative Sinuses: Mucosal thickening and right-sided ethmoid air cells. Orbits: Negative other than left cataract resection. Review of the MIP images  confirms the above findings CTA NECK FINDINGS Aortic arch: Unremarkable.  Three vessel branching. Right carotid system: Vessels are smooth and widely patent. There is mild tortuosity. No atheromatous changes. Left carotid system: Vessels are widely patent. Tortuosity with mild kinking which accounts for subtle lobulation of the lumen at the level of the ICA, accentuated by artifact from dental amalgam. No convincing beading. No dissection flap. Minimal atherosclerotic plaque at the distal common carotid. Vertebral arteries: No proximal subclavian stenosis. Left dominant vertebral artery. Both vertebral arteries are smooth when accounting for artifact from dental amalgam. No vertebral stenosis. Skeleton: Advanced and generalized cervical disc degeneration. Other neck: No incidental mass or inflammation. Upper chest: Negative Review of the MIP images confirms the above findings CTA HEAD FINDINGS Anterior circulation: Atherosclerotic plaque to a mild degree on the carotid siphons. No major branch occlusion or flow limiting stenosis. Negative for aneurysm. Posterior circulation: Left vertebral artery dominance. Vertebral and basilar arteries are smooth and diffusely patent. Aplastic left P1 segment with fetal type PCA. Negative for aneurysm. No flow limiting stenosis. Venous sinuses: Patent Anatomic variants: As above Delayed phase: No abnormal intracranial enhancement. Review of the MIP images confirms the above findings IMPRESSION: 1. No emergent finding. No flow limiting stenosis or embolic source identified in the head or neck. 2. Very mild  atherosclerosis. 3. Known small left frontal cortex infarct that is not detectable by CT. No hemorrhage or visible progression. Electronically Signed   By: Marnee SpringJonathon  Watts M.D.   On: 11/23/2017 11:44   Mr Brain Wo Contrast  Result Date: 11/21/2017 CLINICAL DATA:  Stroke EXAM: MRI HEAD WITHOUT CONTRAST TECHNIQUE: Multiplanar, multiecho pulse sequences of the brain and  surrounding structures were obtained without intravenous contrast. COMPARISON:  CT head 11/21/2017 FINDINGS: Brain: Small focus of restricted diffusion left frontal cortex over the convexity, most compatible with acute infarct. No other areas of acute infarct Mild chronic microvascular ischemia in the white matter. Negative for mass or edema. Ventricle size normal. Several areas of chronic microhemorrhage in the cerebral cortex, bilaterally. Vascular: Normal arterial flow voids Skull and upper cervical spine: Negative Sinuses/Orbits: Mild mucosal edema paranasal sinuses. Cataract removal left. No orbital mass Other: None IMPRESSION: Small area of acute infarct left frontal cortex over the convexity. Possible embolus. Mild chronic microvascular ischemia in the white matter. Scattered areas of chronic microhemorrhage in the cerebral cortex bilaterally, probable hypertension related. Electronically Signed   By: Marlan Palauharles  Clark M.D.   On: 11/21/2017 19:52        Scheduled Meds: . aspirin EC  81 mg Oral Daily  . calcium carbonate  1 tablet Oral Q breakfast  . celecoxib  200 mg Oral BID  . clopidogrel  75 mg Oral Daily  . docusate sodium  100 mg Oral BID  . enoxaparin (LOVENOX) injection  40 mg Subcutaneous Q24H  . gabapentin  300 mg Oral TID  . pravastatin  20 mg Oral q1800  . Vitamin D (Ergocalciferol)  50,000 Units Oral Q7 days   Continuous Infusions: . [START ON 11/24/2017] sodium chloride    . dextrose 5 % and 0.45% NaCl Stopped (11/20/17 1500)     LOS: 4 days     Marcellus ScottAnand Herndon Grill, MD, FACP, [facility] HospitalF[facility]. [facility] Pager [phone]  If 7PM-7AM, please contact night-coverage [email or web address] Password [email or web address] 11/23/2017, 4:25 PM

## 2017-11-23 NOTE — Progress Notes (Addendum)
STROKE TEAM PROGRESS NOTE   SUBJECTIVE (INTERVAL HISTORY) Her son, Christy Fletcher, Interior and spatial designer of [employer] is at the bedside.  He recounted hx w/ Dr. Pearlean Brownie - post op, garbled speech when talking with family, which resolved in 3-4 mins and completely clear prior to MRI. Hx fainting twice over the course of her life without prior workup - many many years ago. No hx CAD, no cardiac problems.    OBJECTIVE Vitals:   11/22/17 0442 11/22/17 1500 11/22/17 1956 11/23/17 0551  BP: (!) 106/48 (!) 116/44 (!) 105/58 121/63  Pulse: 87 (!) 102 88 73  Resp:  18 17 17   Temp: 98.2 F (36.8 C) 98 F (36.7 C) 98.4 F (36.9 C) 98 F (36.7 C)  TempSrc: Oral Oral Oral Oral  SpO2: 92% 96% 98% 93%  Weight:      Height:        CBC:  Recent Labs  Lab 11/19/17 1828  11/22/17 0514 11/23/17 0853  WBC 9.5   < > 9.7 9.4  NEUTROABS 7.9*  --   --   --   HGB 12.9   < > 11.3* 11.1*  HCT 39.5   < > 35.4* 35.1*  MCV 90.4   < > 93.9 93.1  PLT 220   < > 176 207   < > = values in this interval not displayed.    Basic Metabolic Panel:  Recent Labs  Lab 11/21/17 0704 11/22/17 0514  NA 136 139  K 4.0 4.0  CL 104 106  CO2 25 27  GLUCOSE 133* 100*  BUN 15 20  CREATININE 0.78 0.78  CALCIUM 8.6* 8.6*    Lipid Panel:     Component Value Date/Time   CHOL 139 11/22/2017 0514   TRIG 122 11/22/2017 0514   HDL 51 11/22/2017 0514   CHOLHDL 2.7 11/22/2017 0514   VLDL 24 11/22/2017 0514   LDLCALC 64 11/22/2017 0514   HgbA1c:  Lab Results  Component Value Date   HGBA1C 5.6 11/22/2017    PHYSICAL EXAM Pleasant elderly caucasian lady not in distress. . Afebrile. Head is nontraumatic. Neck is supple without bruit.    Cardiac exam no murmur or gallop. Lungs are clear to auscultation. Distal pulses are well felt. Neurological Exam ;  Awake  Alert oriented x 3. Normal speech and language.eye movements full without nystagmus.fundi were not visualized. Vision acuity and fields appear normal. Hearing is normal.  Palatal movements are normal. Face symmetric. Tongue midline. Normal strength, tone, reflexes and coordination. Normal sensation. Gait deferred.   ASSESSMENT/PLAN Christy Fletcher is a 76 y.o. female w/ hx of osteopenia, dyslipidemia and arthiritis admitted for hip pain following mechanical fall who post op developed a few minutes of garbled speech in hsoptial, which quickly resolved. She was not on tele at time of the event. She did not receive IV t-PA due to fast symptoms resolution. MRI shows an acute infarct.  Stroke:  left frontal infarct in language area, infarct embolic secondary to unknown source, concern for AF  Not on telemetry during event. Add tele at this time to look for arrhythmia   Resultant  Aphasia resolved  Code Stroke CT normal  CTA head & neck ordered (keep CTA neck d/t need to look closer at VAs)  MRI  L frontal cortical infarct in language area  Carotid Doppler  B ICA 1-39% stenosis, VAs antegrade   2D Echo  EF 60-65%. No source of embolus   TEE to look for embolic source. Arranged  with [facility] for tomorrow.  If positive for PFO (patent foramen ovale), check bilateral lower extremity venous dopplers to rule out DVT as possible source of stroke. (I have made patient NPO after midnight tonight).   LDL 64  HgbA1c 5.6  Lovenox 40 mg sq daily for VTE prophylaxis  Diet regular Room service appropriate? Yes; Fluid consistency: Thin  Diet - low sodium heart healthy  No antithrombotic prior to admission, now on aspirin 81 mg daily and clopidogrel 75 mg daily. At discharge, continue DAPT x 3 weeks then plavix alone  Therapy recommendations:  OP PT r/t hip  Disposition:  Return home with husband. Likely stable for d/c after TEE tomorrow  Hyperlipidemia  Home meds:  mevacor 20 with formulary change to pravachol 20 in hospital. Resume mevacor at time of discharge  LDL 65 at goal  Other Stroke Risk Factors  Advanced  age  Other Active Problems  Osteopenia  Hip fx after fall s/p OR  Hospital day # 4  Christy Fletcher  [facility] See Amion for Pager information 11/23/2017 12:58 PM  I have personally examined this patient, reviewed notes, independently viewed imaging studies, participated in medical decision making and plan of care.ROS completed by me personally and pertinent positives fully documented  I have made any additions or clarifications directly to the above note. Agree with note above. She presented with transient episode of expressive aphasia due to embolic left frontal tiny MCA branch infarct etiology to be determined. Strong suspicion for atrial fibrillation. Recommend continue ongoing stroke workup and check transesophageal echocardiogram followed by loop recorder. Long discussion at the bedside with the patient and son and answered questions. Discussed with Dr. Waymon AmatoHongalgi. Greater than 50% time during this 35 minute visit was spent on counseling and coordination of care about her embolic stroke and answered questions. Christy HeadyPramod Zenola Dezarn, MD Medical Director [facility] Pager: [phone] 11/23/2017 5:28 PM  To contact Stroke Continuity provider, please refer to [email or web address]. After hours, contact General Neurology

## 2017-11-23 NOTE — Progress Notes (Signed)
PROGRESS NOTE   Christy Fletcher  ZOX:[id number]    DOB: July 07, 1941    DOA: 11/19/2017  PCP: Gareth Morgan, MD   I have briefly reviewed patients previous medical records in [facility]  Brief Narrative:  77 year old female with PMH of osteopenia, HLD, initially presented to [facility] ED due to severe right hip pain after mechanical fall at home.  She underwent right hip hemiarthroplasty 11/20/17.  Postop course was complicated by few minutes of garbled speech which quickly resolved and workup revealed acute stroke.  Patient was transferred to [facility] for evaluation by neurology/stroke service.   Assessment & Plan:   Principal Problem:   Hip fracture (HCC) Active Problems:   Osteopenia   Arthritis   Dyslipidemia   Cerebral embolism with cerebral infarction   Acute stroke: Neurology consultation and follow-up appreciated.  Completing stroke workup.  MRI brain: Left frontal cortical infarct and language area.  Code stroke CT head normal.  Stroke felt to be embolic secondary to unknown source, concern for A. fib but patient was not on telemetry during the event.  Telemetry thus far shows sinus rhythm.  Resultant transient aphasia has resolved.  Carotid Dopplers: Bilateral ICA 1-39% stenosis, vertebral artery flow antegrade.  CTA head and neck: Appears unremarkable.  2D echo: LVEF 60-65%.  LDL 64.  A1c 5.6.  TEE and loop recorder planned for 3/7.  Not on antithrombotics prior to admission.  Now on aspirin 81 mg + clopidogrel 75 mg daily, continue DAPT for 3 weeks then Plavix alone.  Outpatient PT for right hip.  Hyperlipidemia: LDL 65, goal <70.  Resume Mevacor at time of discharge.  Right hip fracture, s/p right hip hemiarthroplasty 11/20/17: Outpatient PT.  Follow-up with orthopedics.  Osteopenia: Outpatient follow-up regarding DEXA scan   DVT prophylaxis: Lovenox Code Status: Full Family Communication: Discussed in detail with patient's son at bedside.  He is known to me,  works with [employer]. Disposition: DC home possibly 11/24/17 pending completion of stroke workup.   Consultants:  Neurology Orthopedics  Procedures:  As above  Antimicrobials:  None   Subjective: Denies complaints.  Appropriate postop right hip pain.  Garbled speech resolved without recurrence.  No other strokelike symptoms.  ROS: As above.  Objective:  Vitals:   11/22/17 1500 11/22/17 1956 11/23/17 0551 11/23/17 1421  BP: (!) 116/44 (!) 105/58 121/63 (!) 129/53  Pulse: (!) 102 88 73 94  Resp: 18 17 17 17   Temp: 98 F (36.7 C) 98.4 F (36.9 C) 98 F (36.7 C) 98.5 F (36.9 C)  TempSrc: Oral Oral Oral Oral  SpO2: 96% 98% 93% 98%  Weight:      Height:        Examination:  General exam: Pleasant elderly female, moderately built and nourished, lying comfortably propped up in bed. Respiratory system: Clear to auscultation. Respiratory effort normal. Cardiovascular system: S1 & S2 heard, RRR. No JVD, murmurs, rubs, gallops or clicks. No pedal edema.  Telemetry: Sinus rhythm. Gastrointestinal system: Abdomen is nondistended, soft and nontender. No organomegaly or masses felt. Normal bowel sounds heard. Central nervous system: Alert and oriented. No focal neurological deficits. Extremities: Symmetric 5 x 5 power.  Right hip postop dressing clean and dry. Skin: No rashes, lesions or ulcers Psychiatry: Judgement and insight appear normal. Mood & affect appropriate.     Data Reviewed: I have personally reviewed following labs and imaging studies  CBC: Recent Labs  Lab 11/19/17 1828 11/21/17 0704 11/22/17 0514 11/23/17 0853  WBC 9.5  13.9* 9.7 9.4  NEUTROABS 7.9*  --   --   --   HGB 12.9 11.8* 11.3* 11.1*  HCT 39.5 37.0 35.4* 35.1*  MCV 90.4 93.2 93.9 93.1  PLT 220 198 176 207   Basic Metabolic Panel: Recent Labs  Lab 11/19/17 1828 11/21/17 0704 11/22/17 0514  NA 140 136 139  K 3.9 4.0 4.0  CL 105 104 106  CO2 26 25 27   GLUCOSE 104* 133* 100*  BUN 15 15 20    CREATININE 0.67 0.78 0.78  CALCIUM 9.1 8.6* 8.6*   HbA1C: Recent Labs    11/22/17 0514  HGBA1C 5.6   CBG: No results for input(s): GLUCAP in the last 168 hours.  Recent Results (from the past 240 hour(s))  MRSA PCR Screening     Status: None   Collection Time: 11/20/17  5:37 AM  Result Value Ref Range Status   MRSA by PCR NEGATIVE NEGATIVE Final    Comment:        The GeneXpert MRSA Assay (FDA approved for NASAL specimens only), is one component of a comprehensive MRSA colonization surveillance program. It is not intended to diagnose MRSA infection nor to guide or monitor treatment for MRSA infections. Performed at [facility], [address], [place], [place] [address]          Radiology Studies: Ct Angio Head W Or Wo Contrast  Result Date: 11/23/2017 CLINICAL DATA:  Stroke follow-up EXAM: CT ANGIOGRAPHY HEAD AND NECK TECHNIQUE: Multidetector CT imaging of the head and neck was performed using the standard protocol during bolus administration of intravenous contrast. Multiplanar CT image reconstructions and MIPs were obtained to evaluate the vascular anatomy. Carotid stenosis measurements (when applicable) are obtained utilizing NASCET criteria, using the distal internal carotid diameter as the denominator. CONTRAST:  50mL ISOVUE-370 IOPAMIDOL (ISOVUE-370) INJECTION 76% COMPARISON:  Brain MRI from 2 days ago. FINDINGS: CT HEAD FINDINGS Brain: Tiny cortical infarct in the left frontal lobe on comparison MRI is not detectable by CT. No hemorrhage or visible infarct progression. Age normal appearance of the brain. Vascular: See below Skull: Negative Sinuses: Mucosal thickening and right-sided ethmoid air cells. Orbits: Negative other than left cataract resection. Review of the MIP images confirms the above findings CTA NECK FINDINGS Aortic arch: Unremarkable.  Three vessel branching. Right carotid system: Vessels are smooth and widely patent. There is mild tortuosity. No  atheromatous changes. Left carotid system: Vessels are widely patent. Tortuosity with mild kinking which accounts for subtle lobulation of the lumen at the level of the ICA, accentuated by artifact from dental amalgam. No convincing beading. No dissection flap. Minimal atherosclerotic plaque at the distal common carotid. Vertebral arteries: No proximal subclavian stenosis. Left dominant vertebral artery. Both vertebral arteries are smooth when accounting for artifact from dental amalgam. No vertebral stenosis. Skeleton: Advanced and generalized cervical disc degeneration. Other neck: No incidental mass or inflammation. Upper chest: Negative Review of the MIP images confirms the above findings CTA HEAD FINDINGS Anterior circulation: Atherosclerotic plaque to a mild degree on the carotid siphons. No major branch occlusion or flow limiting stenosis. Negative for aneurysm. Posterior circulation: Left vertebral artery dominance. Vertebral and basilar arteries are smooth and diffusely patent. Aplastic left P1 segment with fetal type PCA. Negative for aneurysm. No flow limiting stenosis. Venous sinuses: Patent Anatomic variants: As above Delayed phase: No abnormal intracranial enhancement. Review of the MIP images confirms the above findings IMPRESSION: 1. No emergent finding. No flow limiting stenosis or embolic source identified  in the head or neck. 2. Very mild atherosclerosis. 3. Known small left frontal cortex infarct that is not detectable by CT. No hemorrhage or visible progression. Electronically Signed   By: Marnee Spring M.D.   On: 11/23/2017 11:44   Ct Head Wo Contrast  Result Date: 11/21/2017 CLINICAL DATA:  Dysphasia. Follow-up stroke. History of hypercholesterolemia. EXAM: CT HEAD WITHOUT CONTRAST TECHNIQUE: Contiguous axial images were obtained from the base of the skull through the vertex without intravenous contrast. COMPARISON:  CT HEAD November 11, 2015 FINDINGS: BRAIN: No intraparenchymal  hemorrhage, mass effect nor midline shift. The ventricles and sulci are normal for age. Minimal supratentorial white matter hypodensities less than expected for patient's age, though non-specific are most compatible with chronic small vessel ischemic disease. No acute large vascular territory infarcts. No abnormal extra-axial fluid collections. Basal cisterns are patent. VASCULAR: Mild calcific atherosclerosis of the carotid siphons. SKULL: No skull fracture. 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 No significant scalp soft tissue swelling. SINUSES/ORBITS: Mild paranasal sinus mucosal thickening. No paranasal sinus air-fluid levels. Mastoid air cells are well aerated.The included ocular globes and orbital contents are non-suspicious. Status post LEFT ocular lens implant. OTHER: None. IMPRESSION: Normal noncontrast CT HEAD for age. Electronically Signed   By: Awilda Metro M.D.   On: 11/21/2017 17:28   Ct Angio Neck W Or Wo Contrast  Result Date: 11/23/2017 CLINICAL DATA:  Stroke follow-up EXAM: CT ANGIOGRAPHY HEAD AND NECK TECHNIQUE: Multidetector CT imaging of the head and neck was performed using the standard protocol during bolus administration of intravenous contrast. Multiplanar CT image reconstructions and MIPs were obtained to evaluate the vascular anatomy. Carotid stenosis measurements (when applicable) are obtained utilizing NASCET criteria, using the distal internal carotid diameter as the denominator. CONTRAST:  50mL ISOVUE-370 IOPAMIDOL (ISOVUE-370) INJECTION 76% COMPARISON:  Brain MRI from 2 days ago. FINDINGS: CT HEAD FINDINGS Brain: Tiny cortical infarct in the left frontal lobe on comparison MRI is not detectable by CT. No hemorrhage or visible infarct progression. Age normal appearance of the brain. Vascular: See below Skull: Negative Sinuses: Mucosal thickening and right-sided ethmoid air cells. Orbits: Negative other than left cataract resection. Review of the MIP images  confirms the above findings CTA NECK FINDINGS Aortic arch: Unremarkable.  Three vessel branching. Right carotid system: Vessels are smooth and widely patent. There is mild tortuosity. No atheromatous changes. Left carotid system: Vessels are widely patent. Tortuosity with mild kinking which accounts for subtle lobulation of the lumen at the level of the ICA, accentuated by artifact from dental amalgam. No convincing beading. No dissection flap. Minimal atherosclerotic plaque at the distal common carotid. Vertebral arteries: No proximal subclavian stenosis. Left dominant vertebral artery. Both vertebral arteries are smooth when accounting for artifact from dental amalgam. No vertebral stenosis. Skeleton: Advanced and generalized cervical disc degeneration. Other neck: No incidental mass or inflammation. Upper chest: Negative Review of the MIP images confirms the above findings CTA HEAD FINDINGS Anterior circulation: Atherosclerotic plaque to a mild degree on the carotid siphons. No major branch occlusion or flow limiting stenosis. Negative for aneurysm. Posterior circulation: Left vertebral artery dominance. Vertebral and basilar arteries are smooth and diffusely patent. Aplastic left P1 segment with fetal type PCA. Negative for aneurysm. No flow limiting stenosis. Venous sinuses: Patent Anatomic variants: As above Delayed phase: No abnormal intracranial enhancement. Review of the MIP images confirms the above findings IMPRESSION: 1. No emergent finding. No flow limiting stenosis or embolic source identified in the head or neck. 2. Very mild  atherosclerosis. 3. Known small left frontal cortex infarct that is not detectable by CT. No hemorrhage or visible progression. Electronically Signed   By: Marnee SpringJonathon  Watts M.D.   On: 11/23/2017 11:44   Mr Brain Wo Contrast  Result Date: 11/21/2017 CLINICAL DATA:  Stroke EXAM: MRI HEAD WITHOUT CONTRAST TECHNIQUE: Multiplanar, multiecho pulse sequences of the brain and  surrounding structures were obtained without intravenous contrast. COMPARISON:  CT head 11/21/2017 FINDINGS: Brain: Small focus of restricted diffusion left frontal cortex over the convexity, most compatible with acute infarct. No other areas of acute infarct Mild chronic microvascular ischemia in the white matter. Negative for mass or edema. Ventricle size normal. Several areas of chronic microhemorrhage in the cerebral cortex, bilaterally. Vascular: Normal arterial flow voids Skull and upper cervical spine: Negative Sinuses/Orbits: Mild mucosal edema paranasal sinuses. Cataract removal left. No orbital mass Other: None IMPRESSION: Small area of acute infarct left frontal cortex over the convexity. Possible embolus. Mild chronic microvascular ischemia in the white matter. Scattered areas of chronic microhemorrhage in the cerebral cortex bilaterally, probable hypertension related. Electronically Signed   By: Marlan Palauharles  Clark M.D.   On: 11/21/2017 19:52        Scheduled Meds: . aspirin EC  81 mg Oral Daily  . calcium carbonate  1 tablet Oral Q breakfast  . celecoxib  200 mg Oral BID  . clopidogrel  75 mg Oral Daily  . docusate sodium  100 mg Oral BID  . enoxaparin (LOVENOX) injection  40 mg Subcutaneous Q24H  . gabapentin  300 mg Oral TID  . pravastatin  20 mg Oral q1800  . Vitamin D (Ergocalciferol)  50,000 Units Oral Q7 days   Continuous Infusions: . [START ON 11/24/2017] sodium chloride    . dextrose 5 % and 0.45% NaCl Stopped (11/20/17 1500)     LOS: 4 days     Marcellus ScottAnand Lashundra Shiveley, MD, FACP, [facility]. [facility] Pager [phone]  If 7PM-7AM, please contact night-coverage [email or web address] Password [email or web address] 11/23/2017, 4:25 PM

## 2017-11-24 ENCOUNTER — Ambulatory Visit (HOSPITAL_COMMUNITY): Payer: Medicare HMO

## 2017-11-24 ENCOUNTER — Encounter (HOSPITAL_COMMUNITY)
Admission: EM | Disposition: A | Discharge status: Home / Self Care | Payer: Self-pay | Source: Home / Self Care | Attending: Family Medicine

## 2017-11-24 ENCOUNTER — Inpatient Hospital Stay (HOSPITAL_COMMUNITY): Payer: Medicare HMO

## 2017-11-24 ENCOUNTER — Encounter (HOSPITAL_COMMUNITY): Payer: Self-pay | Admitting: *Deleted

## 2017-11-24 DIAGNOSIS — Z96649 Presence of unspecified artificial hip joint: Secondary | ICD-10-CM

## 2017-11-24 DIAGNOSIS — I6389 Other cerebral infarction: Secondary | ICD-10-CM

## 2017-11-24 DIAGNOSIS — Z01810 Encounter for preprocedural cardiovascular examination: Secondary | ICD-10-CM

## 2017-11-24 DIAGNOSIS — I639 Cerebral infarction, unspecified: Secondary | ICD-10-CM

## 2017-11-24 DIAGNOSIS — Q211 Atrial septal defect: Secondary | ICD-10-CM

## 2017-11-24 HISTORY — PX: TEE WITHOUT CARDIOVERSION: SHX5443

## 2017-11-24 HISTORY — PX: LOOP RECORDER INSERTION: EP1214

## 2017-11-24 LAB — BASIC METABOLIC PANEL
ANION GAP: 9 (ref 5–15)
BUN: 15 mg/dL (ref 6–20)
CHLORIDE: 104 mmol/L (ref 101–111)
CO2: 25 mmol/L (ref 22–32)
Calcium: 8.2 mg/dL — ABNORMAL LOW (ref 8.9–10.3)
Creatinine, Ser: 0.67 mg/dL (ref 0.44–1.00)
GFR calc Af Amer: >60 mL/min (ref >60)
GFR calc non Af Amer: >60 mL/min (ref >60)
Glucose, Bld: 99 mg/dL (ref 65–99)
POTASSIUM: 4.4 mmol/L (ref 3.5–5.1)
SODIUM: 138 mmol/L (ref 135–145)

## 2017-11-24 LAB — PROTIME-INR
INR: 1.05
PROTHROMBIN TIME: 13.6 s (ref 11.4–15.2)

## 2017-11-24 SURGERY — ECHOCARDIOGRAM, TRANSESOPHAGEAL
Anesthesia: Moderate Sedation

## 2017-11-24 SURGERY — LOOP RECORDER INSERTION

## 2017-11-24 MED ORDER — MIDAZOLAM HCL 10 MG/2ML IJ SOLN
INTRAMUSCULAR | Status: DC | PRN
  Administered 2017-11-24: 2 mg via INTRAVENOUS

## 2017-11-24 MED ORDER — LIDOCAINE-EPINEPHRINE 1 %-1:100000 IJ SOLN
INTRAMUSCULAR | Status: DC | PRN
  Administered 2017-11-24: 30 mL

## 2017-11-24 MED ORDER — LIDOCAINE-EPINEPHRINE 1 %-1:100000 IJ SOLN
INTRAMUSCULAR | Status: AC
  Filled 2017-11-24: qty 1

## 2017-11-24 MED ORDER — CLOPIDOGREL BISULFATE 75 MG PO TABS: 75.0000 mg | ORAL_TABLET | Freq: Every day | ORAL | 0 refills | Status: DC

## 2017-11-24 MED ORDER — FENTANYL CITRATE (PF) 100 MCG/2ML IJ SOLN
INTRAMUSCULAR | Status: DC | PRN
  Administered 2017-11-24: 25 ug via INTRAVENOUS

## 2017-11-24 MED ORDER — FENTANYL CITRATE (PF) 100 MCG/2ML IJ SOLN
INTRAMUSCULAR | Status: AC
  Filled 2017-11-24: qty 2

## 2017-11-24 MED ORDER — BUTAMBEN-TETRACAINE-BENZOCAINE 2-2-14 % EX AERO
INHALATION_SPRAY | CUTANEOUS | Status: DC | PRN
  Administered 2017-11-24: 2 via TOPICAL

## 2017-11-24 MED ORDER — MIDAZOLAM HCL 5 MG/ML IJ SOLN
INTRAMUSCULAR | Status: AC
  Filled 2017-11-24: qty 2

## 2017-11-24 MED ORDER — DOCUSATE SODIUM 100 MG PO CAPS: 100.0000 mg | ORAL_CAPSULE | Freq: Two times a day (BID) | ORAL | 0 refills | Status: DC

## 2017-11-24 MED ORDER — SENNOSIDES-DOCUSATE SODIUM 8.6-50 MG PO TABS: 1.0000 | ORAL_TABLET | Freq: Every evening | ORAL | 0 refills | Status: DC | PRN

## 2017-11-24 MED ORDER — HYDROCODONE-ACETAMINOPHEN 5-325 MG PO TABS: 1.0000 | ORAL_TABLET | Freq: Four times a day (QID) | ORAL | 0 refills | Status: DC | PRN

## 2017-11-24 MED ORDER — ASPIRIN 81 MG PO TBEC: 81.0000 mg | DELAYED_RELEASE_TABLET | Freq: Every day | ORAL | 0 refills | Status: DC

## 2017-11-24 SURGICAL SUPPLY — 2 items
LOOP REVEAL LINQSYS (Prosthesis & Implant Heart) ×3 IMPLANT
PACK LOOP INSERTION (CUSTOM PROCEDURE TRAY) ×3 IMPLANT

## 2017-11-24 NOTE — Progress Notes (Signed)
STROKE TEAM PROGRESS NOTE   SUBJECTIVE (INTERVAL HISTORY) Her son, is at the bedside.   She is stable. No new complaints. 2-D echo showed normal ejection fraction but shows intra-atrial septum aneurysm.  OBJECTIVE Vitals:   11/23/17 1421 11/23/17 2131 11/24/17 0600 11/24/17 1325  BP: (!) 129/53 (!) 124/59 110/61 138/60  Pulse: 94 88 79 77  Resp: 17 16 16  (!) 21  Temp: 98.5 F (36.9 C) 98.4 F (36.9 C) 97.9 F (36.6 C) 97.9 F (36.6 C)  TempSrc: Oral Oral Oral Oral  SpO2: 98% 95% 94% 92%  Weight:      Height:        CBC:  Recent Labs  Lab 11/19/17 1828  11/22/17 0514 11/23/17 0853  WBC 9.5   < > 9.7 9.4  NEUTROABS 7.9*  --   --   --   HGB 12.9   < > 11.3* 11.1*  HCT 39.5   < > 35.4* 35.1*  MCV 90.4   < > 93.9 93.1  PLT 220   < > 176 207   < > = values in this interval not displayed.    Basic Metabolic Panel:  Recent Labs  Lab 11/22/17 0514 11/24/17 0503  NA 139 138  K 4.0 4.4  CL 106 104  CO2 27 25  GLUCOSE 100* 99  BUN 20 15  CREATININE 0.78 0.67  CALCIUM 8.6* 8.2*    Lipid Panel:     Component Value Date/Time   CHOL 139 11/22/2017 0514   TRIG 122 11/22/2017 0514   HDL 51 11/22/2017 0514   CHOLHDL 2.7 11/22/2017 0514   VLDL 24 11/22/2017 0514   LDLCALC 64 11/22/2017 0514   HgbA1c:  Lab Results  Component Value Date   HGBA1C 5.6 11/22/2017    PHYSICAL EXAM Pleasant elderly caucasian lady not in distress. . Afebrile. Head is nontraumatic. Neck is supple without bruit.    Cardiac exam no murmur or gallop. Lungs are clear to auscultation. Distal pulses are well felt. Neurological Exam ;  Awake  Alert oriented x 3. Normal speech and language.eye movements full without nystagmus.fundi were not visualized. Vision acuity and fields appear normal. Hearing is normal. Palatal movements are normal. Face symmetric. Tongue midline. Normal strength, tone, reflexes and coordination. Normal sensation. Gait deferred.   ASSESSMENT/PLAN [name] is  a 77 y.o. female w/ hx of osteopenia, dyslipidemia and arthiritis admitted for hip pain following mechanical fall who post op developed a few minutes of garbled speech in hsoptial, which quickly resolved. She was not on tele at time of the event. She did not receive IV t-PA due to fast symptoms resolution. MRI shows an acute infarct.  Stroke:  left frontal infarct in language area, infarct embolic secondary to unknown source, concern for AF  Not on telemetry during event. Add tele at this time to look for arrhythmia   Resultant  Aphasia resolved  Code Stroke CT normal  CTA head & neck ordered (keep CTA neck d/t need to look closer at VAs)  MRI  L frontal cortical infarct in language area  Carotid Doppler  B ICA 1-39% stenosis, VAs antegrade   2D Echo  EF 60-65%. No source of embolus   TEE to look for embolic source. Arranged with [facility] for tomorrow.  If positive for PFO (patent foramen ovale), check bilateral lower extremity venous dopplers to rule out DVT as possible source of stroke. (I have made patient NPO after midnight tonight).  LDL 64  HgbA1c 5.6  Lovenox 40 mg sq daily for VTE prophylaxis Diet - low sodium heart healthy Diet NPO time specified Except for: Sips with Meds  No antithrombotic prior to admission, now on aspirin 81 mg daily and clopidogrel 75 mg daily. At discharge, continue DAPT x 3 weeks then plavix alone  Therapy recommendations:  OP PT r/t hip  Disposition:  Return home with husband. Likely stable for d/c after TEE tomorrow  Hyperlipidemia  Home meds:  mevacor 20 with formulary change to pravachol 20 in hospital. Resume mevacor at time of discharge  LDL 65 at goal  Other Stroke Risk Factors  Advanced age  Other Active Problems  Osteopenia  Hip fx after fall s/p OR  Hospital day # 5    I have personally examined this patient, reviewed notes, independently viewed imaging studies, participated in medical decision  making and plan of care.ROS completed by me personally and pertinent positives fully documented  I have made any additions or clarifications directly to the above note. Agree with note above. She presented with transient episode of expressive aphasia due to embolic left frontal tiny MCA branch infarct etiology to be determined. Strong suspicion for atrial fibrillation. Recommend  check transesophageal echocardiogram and lower extremity venous Doppler and if negative then   loop recorder. Long discussion at the bedside with the patient and son and answered questions. Discussed with Dr. Waymon AmatoHongalgi. Greater than 50% time during this 25 minute visit was spent on counseling and coordination of care about her embolic stroke and answered questions.  Delia HeadyPramod Sethi, MD Medical Director [facility] Pager: [phone] 11/24/2017 2:12 PM  To contact Stroke Continuity provider, please refer to [email or web address]. After hours, contact General Neurology

## 2017-11-24 NOTE — Consult Note (Addendum)
ELECTROPHYSIOLOGY CONSULT NOTE  Patient ID: Christy MedinMargaret S Lavine MRN: [id number], DOB/AGE: November 17, 1940   Admit date: 11/19/2017 Date of Consult: 11/24/2017  Primary Physician: Gareth MorganKnowlton, Steve, MD Primary Cardiologist: none Reason for Consultation: Cryptogenic stroke ; recommendations regarding Implantable Loop Recorder, requested by Dr. Pearlean BrownieSethi  History of Present Illness Christy Fletcher was admitted on 11/19/2017 initially after a mechanical fall w/R hip fracture, during POD#1 had transient garbled speech and found with CVA .   PMHx is minimal w/ osteopenia, HLD, arthritis.  Imaging demonstrated left frontal infarct in language area, infarct embolic secondary to unknown source, concern for AF.  She was not on a monitor at the time.   she has undergone workup for stroke including echocardiogram and carotid angio.  The patient has been monitored on telemetry since the event which has demonstrated sinus rhythm with no arrhythmias.  Inpatient stroke work-up is to be completed with a TEE.   Echocardiogram this admission demonstrated  Study Conclusions - Left ventricle: The cavity size was normal. Systolic function was   normal. The estimated ejection fraction was in the range of 60%   to 65%. Wall motion was normal; there were no regional wall   motion abnormalities. Doppler parameters are consistent with   abnormal left ventricular relaxation (grade 1 diastolic   dysfunction). - Atrial septum: A patent foramen ovale cannot be excluded. There   was an atrial septal aneurysm. - Tricuspid valve: There was moderate regurgitation directed   centrally. - Pulmonary arteries: Systolic pressure was mildly increased. PA   peak pressure: 42 mm Hg (S). Recommendations:  Consider saline contrast study +/- transesophageal echocardiography if clinically indicated in order to exclude right to left atrial shunt.   Lab work is reviewed.   Prior to admission, the patient denies chest pain, shortness of breath,  dizziness, palpitations, or syncope.  They are recovering from their stroke with plans to home at discharge.   Past Medical History:  Diagnosis Date  . Arthritis   . Diarrhea   . Hypercholesteremia   . Osteopenia      Surgical History:  Past Surgical History:  Procedure Laterality Date  . ABDOMINAL HYSTERECTOMY    . CATARACT EXTRACTION    . COLONOSCOPY    . COLONOSCOPY N/A 05/14/2013   Procedure: COLONOSCOPY;  Surgeon: West BaliSandi L Fields, MD;  Location: [facility];  Service: Endoscopy;  Laterality: N/A;  10:30 AM-rescheduled to 8:45am Doris notified pt  . HERNIA REPAIR    . HIP ARTHROPLASTY Right 11/20/2017   Procedure: RIGHT HIP HEMIARTHROPLASTY;  Surgeon: Eldred MangesYates, Mark C, MD;  [facility];  Service: Orthopedics;  Laterality: Right;     Medications Prior to Admission  Medication Sig Dispense Refill Last Dose  . calcium carbonate (OS-CAL) 600 MG TABS tablet Take 1,200 mg by mouth daily.   11/18/2017 at Unknown time  . lovastatin (MEVACOR) 20 MG tablet Take 20 mg by mouth at bedtime.   11/19/2017 at Unknown time    Inpatient Medications:  . aspirin EC  81 mg Oral Daily  . calcium carbonate  1 tablet Oral Q breakfast  . celecoxib  200 mg Oral BID  . clopidogrel  75 mg Oral Daily  . docusate sodium  100 mg Oral BID  . enoxaparin (LOVENOX) injection  40 mg Subcutaneous Q24H  . gabapentin  300 mg Oral TID  . pravastatin  20 mg Oral q1800  . Vitamin D (Ergocalciferol)  50,000 Units Oral Q7 days    Allergies: No  Known Allergies  Social History   Socioeconomic History  . Marital status: Married    Spouse name: Not on file  . Number of children: Not on file  . Years of education: Not on file  . Highest education level: Not on file  Social Needs  . Financial resource strain: Not on file  . Food insecurity - worry: Not on file  . Food insecurity - inability: Not on file  . Transportation needs - medical: Not on file  . Transportation needs - non-medical: Not on file    Occupational History  . Not on file  Tobacco Use  . Smoking status: Never Smoker  . Smokeless tobacco: Never Used  Substance and Sexual Activity  . Alcohol use: No  . Drug use: No  . Sexual activity: Not on file  Other Topics Concern  . Not on file  Social History Narrative  . Not on file     Family History  Problem Relation Age of Onset  . Colon cancer Neg Hx       Review of Systems: All other systems reviewed and are otherwise negative except as noted above.  Physical Exam: Vitals:   11/23/17 0551 11/23/17 1421 11/23/17 2131 11/24/17 0600  BP: 121/63 (!) 129/53 (!) 124/59 110/61  Pulse: 73 94 88 79  Resp: 17 17 16 16   Temp: 98 F (36.7 C) 98.5 F (36.9 C) 98.4 F (36.9 C) 97.9 F (36.6 C)  TempSrc: Oral Oral Oral Oral  SpO2: 93% 98% 95% 94%  Weight:      Height:        GEN- The patient is well appearing, alert and oriented x 3 today.   Head- normocephalic, atraumatic Eyes-  Sclera clear, conjunctiva pink Ears- hearing intact Oropharynx- clear Neck- supple Lungs- CTA b/l, normal work of breathing Heart- RRR, no murmurs, rubs or gallops  GI- soft, NT, ND Extremities- no clubbing, cyanosis, or edema MS- no significant deformity, age appropriate atrophy Skin- no rash or lesion Psych- euthymic mood, full affect   Labs:   Lab Results  Component Value Date   WBC 9.4 11/23/2017   HGB 11.1 (L) 11/23/2017   HCT 35.1 (L) 11/23/2017   MCV 93.1 11/23/2017   PLT 207 11/23/2017    Recent Labs  Lab 11/24/17 0503  NA 138  K 4.4  CL 104  CO2 25  BUN 15  CREATININE 0.67  CALCIUM 8.2*  GLUCOSE 99   No results found for: CKTOTAL, CKMB, CKMBINDEX, TROPONINI Lab Results  Component Value Date   CHOL 139 11/22/2017   Lab Results  Component Value Date   HDL 51 11/22/2017   Lab Results  Component Value Date   LDLCALC 64 11/22/2017   Lab Results  Component Value Date   TRIG 122 11/22/2017   Lab Results  Component Value Date   CHOLHDL 2.7  11/22/2017   No results found for: LDLDIRECT  No results found for: DDIMER   Radiology/Studies:   Ct Angio Head W Or Wo Contrast Result Date: 11/23/2017 CLINICAL DATA:  Stroke follow-up EXAM: CT ANGIOGRAPHY HEAD AND NECK TECHNIQUE: Multidetector CT imaging of the head and neck was performed using the standard protocol during bolus administration of intravenous contrast. Multiplanar CT image reconstructions and MIPs were obtained to evaluate the vascular anatomy. Carotid stenosis measurements (when applicable) are obtained utilizing NASCET criteria, using the distal internal carotid diameter as the denominator. CONTRAST:  50mL ISOVUE-370 IOPAMIDOL (ISOVUE-370) INJECTION 76% COMPARISON:  Brain MRI from 2 days  ago. FINDINGS: CT HEAD FINDINGS Brain: Tiny cortical infarct in the left frontal lobe on comparison MRI is not detectable by CT. No hemorrhage or visible infarct progression. Age normal appearance of the brain. Vascular: See below Skull: Negative Sinuses: Mucosal thickening and right-sided ethmoid air cells. Orbits: Negative other than left cataract resection. Review of the MIP images confirms the above findings CTA NECK FINDINGS Aortic arch: Unremarkable.  Three vessel branching. Right carotid system: Vessels are smooth and widely patent. There is mild tortuosity. No atheromatous changes. Left carotid system: Vessels are widely patent. Tortuosity with mild kinking which accounts for subtle lobulation of the lumen at the level of the ICA, accentuated by artifact from dental amalgam. No convincing beading. No dissection flap. Minimal atherosclerotic plaque at the distal common carotid. Vertebral arteries: No proximal subclavian stenosis. Left dominant vertebral artery. Both vertebral arteries are smooth when accounting for artifact from dental amalgam. No vertebral stenosis. Skeleton: Advanced and generalized cervical disc degeneration. Other neck: No incidental mass or inflammation. Upper chest: Negative  Review of the MIP images confirms the above findings CTA HEAD FINDINGS Anterior circulation: Atherosclerotic plaque to a mild degree on the carotid siphons. No major branch occlusion or flow limiting stenosis. Negative for aneurysm. Posterior circulation: Left vertebral artery dominance. Vertebral and basilar arteries are smooth and diffusely patent. Aplastic left P1 segment with fetal type PCA. Negative for aneurysm. No flow limiting stenosis. Venous sinuses: Patent Anatomic variants: As above Delayed phase: No abnormal intracranial enhancement. Review of the MIP images confirms the above findings IMPRESSION: 1. No emergent finding. No flow limiting stenosis or embolic source identified in the head or neck. 2. Very mild atherosclerosis. 3. Known small left frontal cortex infarct that is not detectable by CT. No hemorrhage or visible progression. Electronically Signed   By: Marnee Spring M.D.   On: 11/23/2017 11:44   Dg Chest 1 View Result Date: 11/19/2017 CLINICAL DATA:  Fall. EXAM: CHEST 1 VIEW COMPARISON:  June 26, 2010 FINDINGS: Prominence of the mediastinum is probably due to the portable technique. A PA and lateral chest x-ray could further evaluate if clinically warranted. The heart, hila, and pleura are normal. No pneumothorax. No pulmonary nodules, masses, or focal infiltrates. IMPRESSION: Mild prominence of mediastinum is likely due to portable technique. No other abnormalities. Electronically Signed   By: Gerome Sam III M.D   On: 11/19/2017 18:40    Ct Head Wo Contrast Result Date: 11/21/2017 CLINICAL DATA:  Dysphasia. Follow-up stroke. History of hypercholesterolemia. EXAM: CT HEAD WITHOUT CONTRAST TECHNIQUE: Contiguous axial images were obtained from the base of the skull through the vertex without intravenous contrast. COMPARISON:  CT HEAD November 11, 2015 FINDINGS: BRAIN: No intraparenchymal hemorrhage, mass effect nor midline shift. The ventricles and sulci are normal for age. Minimal  supratentorial white matter hypodensities less than expected for patient's age, though non-specific are most compatible with chronic small vessel ischemic disease. No acute large vascular territory infarcts. No abnormal extra-axial fluid collections. Basal cisterns are patent. VASCULAR: Mild calcific atherosclerosis of the carotid siphons. SKULL: No skull fracture. 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 No significant scalp soft tissue swelling. SINUSES/ORBITS: Mild paranasal sinus mucosal thickening. No paranasal sinus air-fluid levels. Mastoid air cells are well aerated.The included ocular globes and orbital contents are non-suspicious. Status post LEFT ocular lens implant. OTHER: None. IMPRESSION: Normal noncontrast CT HEAD for age. Electronically Signed   By: Awilda Metro M.D.   On: 11/21/2017 17:28     Mr Brain Wo Contrast Result  Date: 11/21/2017 CLINICAL DATA:  Stroke EXAM: MRI HEAD WITHOUT CONTRAST TECHNIQUE: Multiplanar, multiecho pulse sequences of the brain and surrounding structures were obtained without intravenous contrast. COMPARISON:  CT head 11/21/2017 FINDINGS: Brain: Small focus of restricted diffusion left frontal cortex over the convexity, most compatible with acute infarct. No other areas of acute infarct Mild chronic microvascular ischemia in the white matter. Negative for mass or edema. Ventricle size normal. Several areas of chronic microhemorrhage in the cerebral cortex, bilaterally. Vascular: Normal arterial flow voids Skull and upper cervical spine: Negative Sinuses/Orbits: Mild mucosal edema paranasal sinuses. Cataract removal left. No orbital mass Other: None IMPRESSION: Small area of acute infarct left frontal cortex over the convexity. Possible embolus. Mild chronic microvascular ischemia in the white matter. Scattered areas of chronic microhemorrhage in the cerebral cortex bilaterally, probable hypertension related. Electronically Signed   By: Marlan Palau M.D.    On: 11/21/2017 19:52    12-lead ECG SR All prior EKG's in EPIC reviewed with no documented atrial fibrillation  Telemetry SR only  Assessment and Plan:  1. Cryptogenic stroke The patient presents with cryptogenic stroke.  The patient has a TEE planned for this afternoon.  I spoke at length with the patient/son at bedside about monitoring for afib with either a 30 day event monitor or an implantable loop recorder.  Risks, benefits, and alteratives to implantable loop recorder were discussed with the patient today.   At this time, she is very clear in her decision to proceed with implantable loop recorder.   Wound care was reviewed with the patient (keep incision clean and dry for 3 days).  Wound check Lynita Groseclose be scheduled for the patient  Please call with questions.   Sheilah Pigeon, PA-C 11/24/2017  I have seen and examined this patient with Francis Dowse.  Agree with above, note added to reflect my findings.  On exam, RRR, no murmurs, lungs clear. Had CVA after hip surgery. No cause thus far. Plan for LINQ implant. Risks and benefits discussed and include bleeding and infection. The patient understands the risks and has agreed to the procedure.    Eveline Sauve M. Atisha Hamidi MD 11/24/2017 3:43 PM

## 2017-11-24 NOTE — Discharge Summary (Signed)
Physician Discharge Summary  Christy Fletcher WGN:[id number] DOB: 1940-11-25  PCP: Gareth Morgan, MD  Admit date: 11/19/2017 Discharge date: 11/24/2017  Recommendations for Outpatient Follow-up:  1. Dr. John Giovanni, PCP in 1 week with repeat labs (CBC & BMP). 2. Dr. Rita Ohara, Orthopedics in 2 weeks. 3. [facility]: ILR wound check 12/07/17 at 2 PM. 4. Dr.  Delia Heady, Neurology in 6 weeks.  Ambulatory referral sent.  Home Health: Outpatient PT Equipment/Devices: None  Discharge Condition: Improved and stable CODE STATUS: Full Diet recommendation: Heart healthy diet.  Discharge Diagnoses:  Principal Problem:   Hip fracture (HCC) Active Problems:   Osteopenia   Arthritis   Dyslipidemia   Cerebral embolism with cerebral infarction   Brief Summary: 77 year old female with PMH of osteopenia, HLD, initially presented to [facility] ED due to severe right hip pain after mechanical fall at home.  She underwent right hip hemiarthroplasty 11/20/17.  Postop course was complicated by few minutes of garbled speech which quickly resolved and workup revealed acute stroke.  Patient was transferred to [facility] for evaluation by Neurology/stroke service.   Assessment & Plan:   Acute stroke: Neurology was consulted and under their guidance, patient completed stroke workup. MRI brain: Left frontal cortical infarct and language area.  Code stroke CT head normal.  Stroke felt to be embolic secondary to unknown source, concern for A. fib but patient was not on telemetry during the event.  Telemetry thus far shows sinus rhythm.  Resultant transient aphasia has resolved.  Carotid Dopplers: Bilateral ICA 1-39% stenosis, vertebral artery flow antegrade.  CTA head and neck: Appears unremarkable.  2D echo: LVEF 60-65%.  LDL 64.  A1c 5.6.    Strong suspicion for atrial fibrillation.  TEE results as below but possible source of embolus = atrial septal aneurysm with PFO.  Lower extremity venous  Dopplers negative for DVT.  This was followed by placement of loop recorder by Cardiology.  Not on antithrombotics prior to admission.    As per Neurology, Aspirin 81 mg + Clopidogrel 75 mg daily, continue DAPT for 3 weeks then Aspirin alone.  Outpatient PT for right hip.  Outpatient follow-up with neurology in 6 weeks.  Hyperlipidemia: LDL 65, goal <70.  Resumed Mevacor at time of discharge.  Right hip fracture, s/p right hip hemiarthroplasty 11/20/17: Outpatient PT.  Follow-up with orthopedics.  Discussed with Dr. Ophelia Charter who was okay with above antiplatelet regimen as far as postop VTE prophylaxis was concerned.  Osteopenia: Outpatient follow-up regarding DEXA scan.  Although patient was started on vitamin D 50,000 units weekly in the hospital, will not be continued at discharge.  Deferred to outpatient physician to check vitamin D levels and replace as needed.    Consultants:  Neurology Orthopedics Cardiology  Procedures:  As above   TEE 11/24/17: Findings: Please see echo section for full report.  Normal LV size with mild LV hypertrophy.  EF 55-60%.  Normal RV size and systolic function.  Trivial TR, peak RV-RA gradient 19 mmHg.  Trivial mitral regurgitation.  Trileaflet aortic valve with no stenosis, no significant regurgitation.  Mildly dilated left atrium, no LA appendage thrombus.  Normal right atrium.  There was an atrial septal aneurysm with PFO.  The PFO appeared to have bidirectional shunting. Normal caliber aorta with minimal plaque.   Possible source of embolus = atrial septal aneurysm with PFO.  To get venous dopplers.     Bilateral lower extremity venous duplex completed.No evidence of DVTor superficial thrombosis. Incidental  finding possible left popliteal cyst in the popliteal fossa.    Discharge Instructions  Discharge Instructions    Ambulatory referral to Neurology   Complete by:  As directed    An appointment is requested in approximately: 6 weeks.   Call  MD for:   Complete by:  As directed    Strokelike symptoms.   Call MD for:  difficulty breathing, headache or visual disturbances   Complete by:  As directed    Call MD for:  extreme fatigue   Complete by:  As directed    Call MD for:  persistant dizziness or light-headedness   Complete by:  As directed    Call MD for:  persistant nausea and vomiting   Complete by:  As directed    Call MD for:  redness, tenderness, or signs of infection (pain, swelling, redness, odor or green/yellow discharge around incision site)   Complete by:  As directed    Call MD for:  severe uncontrolled pain   Complete by:  As directed    Call MD for:  temperature >100.4   Complete by:  As directed    Diet - low sodium heart healthy   Complete by:  As directed    Increase activity slowly   Complete by:  As directed    Weight bearing as tolerated   Complete by:  As directed        Medication List    TAKE these medications   aspirin 81 MG EC tablet Take 1 tablet (81 mg total) by mouth daily. Start taking on:  11/25/2017   calcium carbonate 600 MG Tabs tablet Commonly known as:  OS-CAL Take 1,200 mg by mouth daily.   clopidogrel 75 MG tablet Commonly known as:  PLAVIX Take 1 tablet (75 mg total) by mouth daily. Start taking on:  11/25/2017   docusate sodium 100 MG capsule Commonly known as:  COLACE Take 1 capsule (100 mg total) by mouth 2 (two) times daily.   HYDROcodone-acetaminophen 5-325 MG tablet Commonly known as:  NORCO/VICODIN Take 1-2 tablets by mouth every 6 (six) hours as needed for moderate pain.   lovastatin 20 MG tablet Commonly known as:  MEVACOR Take 20 mg by mouth at bedtime.   senna-docusate 8.6-50 MG tablet Commonly known as:  Senokot-S Take 1 tablet by mouth at bedtime as needed for mild constipation.      Follow-up Information    Eldred Manges, MD Follow up in 2 week(s).   Specialty:  Orthopedic Surgery Contact information: [address] [place]  [address] [phone]        [facility] Follow up.   Specialty:  Home Health Services Why:  A representative from [facility] will contact you to arrange start date and time for your therapy. Contact information: [address] [place] [address] [phone]        [facility] [address] [facility] Follow up on 11/21/2017.   Specialty:  Cardiology Contact information: [address], [address] [place] [address] [phone]       Gareth Morgan, MD. Schedule an appointment as soon as possible for a visit in 1 week(s).   Specialty:  Family Medicine Why:  To be seen with repeat labs (CBC & BMP). Contact information: [address] [place] [address] [phone]          No Known Allergies    Procedures/Studies: Ct Angio Head W Or Wo Contrast  Result Date: 11/23/2017 CLINICAL DATA:  Stroke follow-up EXAM: CT ANGIOGRAPHY HEAD AND NECK TECHNIQUE: Multidetector CT imaging of the head and neck was performed using the standard protocol during bolus administration of intravenous contrast. Multiplanar CT image reconstructions and MIPs were obtained to evaluate the vascular anatomy. Carotid stenosis measurements (when applicable) are obtained utilizing NASCET criteria, using the distal internal carotid diameter as the denominator. CONTRAST:  50mL ISOVUE-370 IOPAMIDOL (ISOVUE-370) INJECTION 76% COMPARISON:  Brain MRI from 2 days ago. FINDINGS: CT HEAD FINDINGS Brain: Tiny cortical infarct in the left frontal lobe on comparison MRI is not detectable by CT. No hemorrhage or visible infarct progression. Age normal appearance of the brain. Vascular: See below Skull: Negative Sinuses: Mucosal thickening and right-sided ethmoid air cells. Orbits: Negative other than left cataract resection. Review of the MIP images confirms the above findings CTA NECK FINDINGS Aortic arch: Unremarkable.  Three vessel branching. Right carotid  system: Vessels are smooth and widely patent. There is mild tortuosity. No atheromatous changes. Left carotid system: Vessels are widely patent. Tortuosity with mild kinking which accounts for subtle lobulation of the lumen at the level of the ICA, accentuated by artifact from dental amalgam. No convincing beading. No dissection flap. Minimal atherosclerotic plaque at the distal common carotid. Vertebral arteries: No proximal subclavian stenosis. Left dominant vertebral artery. Both vertebral arteries are smooth when accounting for artifact from dental amalgam. No vertebral stenosis. Skeleton: Advanced and generalized cervical disc degeneration. Other neck: No incidental mass or inflammation. Upper chest: Negative Review of the MIP images confirms the above findings CTA HEAD FINDINGS Anterior circulation: Atherosclerotic plaque to a mild degree on the carotid siphons. No major branch occlusion or flow limiting stenosis. Negative for aneurysm. Posterior circulation: Left vertebral artery dominance. Vertebral and basilar arteries are smooth and diffusely patent. Aplastic left P1 segment with fetal type PCA. Negative for aneurysm. No flow limiting stenosis. Venous sinuses: Patent Anatomic variants: As above Delayed phase: No abnormal intracranial enhancement. Review of the MIP images confirms the above findings IMPRESSION: 1. No emergent finding. No flow limiting stenosis or embolic source identified in the head or neck. 2. Very mild atherosclerosis. 3. Known small left frontal cortex infarct that is not detectable by CT. No hemorrhage or visible progression. Electronically Signed   By: Marnee Spring M.D.   On: 11/23/2017 11:44   Dg Chest 1 View  Result Date: 11/19/2017 CLINICAL DATA:  Fall. EXAM: CHEST 1 VIEW COMPARISON:  June 26, 2010 FINDINGS: Prominence of the mediastinum is probably due to the portable technique. A PA and lateral chest x-ray could further evaluate if clinically warranted. The heart, hila,  and pleura are normal. No pneumothorax. No pulmonary nodules, masses, or focal infiltrates. IMPRESSION: Mild prominence of mediastinum is likely due to portable technique. No other abnormalities. Electronically Signed   By: Gerome Sam III M.D   On: 11/19/2017 18:40   Ct Head Wo Contrast  Result Date: 11/21/2017 CLINICAL DATA:  Dysphasia. Follow-up stroke. History of hypercholesterolemia. EXAM: CT HEAD WITHOUT CONTRAST TECHNIQUE: Contiguous axial images were obtained from the base of the skull through the vertex without intravenous contrast. COMPARISON:  CT HEAD November 11, 2015 FINDINGS: BRAIN: No intraparenchymal hemorrhage, mass effect nor midline shift. The ventricles and sulci are normal for age. Minimal supratentorial white matter hypodensities less than expected for patient's age, though non-specific are most compatible with chronic small vessel ischemic disease. No acute large vascular territory infarcts. No abnormal extra-axial fluid collections. Basal cisterns are patent. VASCULAR: Mild calcific  atherosclerosis of the carotid siphons. SKULL: No skull fracture. 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 No significant scalp soft tissue swelling. SINUSES/ORBITS: Mild paranasal sinus mucosal thickening. No paranasal sinus air-fluid levels. Mastoid air cells are well aerated.The included ocular globes and orbital contents are non-suspicious. Status post LEFT ocular lens implant. OTHER: None. IMPRESSION: Normal noncontrast CT HEAD for age. Electronically Signed   By: Awilda Metro M.D.   On: 11/21/2017 17:28   Ct Angio Neck W Or Wo Contrast  Result Date: 11/23/2017 CLINICAL DATA:  Stroke follow-up EXAM: CT ANGIOGRAPHY HEAD AND NECK TECHNIQUE: Multidetector CT imaging of the head and neck was performed using the standard protocol during bolus administration of intravenous contrast. Multiplanar CT image reconstructions and MIPs were obtained to evaluate the vascular anatomy. Carotid  stenosis measurements (when applicable) are obtained utilizing NASCET criteria, using the distal internal carotid diameter as the denominator. CONTRAST:  50mL ISOVUE-370 IOPAMIDOL (ISOVUE-370) INJECTION 76% COMPARISON:  Brain MRI from 2 days ago. FINDINGS: CT HEAD FINDINGS Brain: Tiny cortical infarct in the left frontal lobe on comparison MRI is not detectable by CT. No hemorrhage or visible infarct progression. Age normal appearance of the brain. Vascular: See below Skull: Negative Sinuses: Mucosal thickening and right-sided ethmoid air cells. Orbits: Negative other than left cataract resection. Review of the MIP images confirms the above findings CTA NECK FINDINGS Aortic arch: Unremarkable.  Three vessel branching. Right carotid system: Vessels are smooth and widely patent. There is mild tortuosity. No atheromatous changes. Left carotid system: Vessels are widely patent. Tortuosity with mild kinking which accounts for subtle lobulation of the lumen at the level of the ICA, accentuated by artifact from dental amalgam. No convincing beading. No dissection flap. Minimal atherosclerotic plaque at the distal common carotid. Vertebral arteries: No proximal subclavian stenosis. Left dominant vertebral artery. Both vertebral arteries are smooth when accounting for artifact from dental amalgam. No vertebral stenosis. Skeleton: Advanced and generalized cervical disc degeneration. Other neck: No incidental mass or inflammation. Upper chest: Negative Review of the MIP images confirms the above findings CTA HEAD FINDINGS Anterior circulation: Atherosclerotic plaque to a mild degree on the carotid siphons. No major branch occlusion or flow limiting stenosis. Negative for aneurysm. Posterior circulation: Left vertebral artery dominance. Vertebral and basilar arteries are smooth and diffusely patent. Aplastic left P1 segment with fetal type PCA. Negative for aneurysm. No flow limiting stenosis. Venous sinuses: Patent Anatomic  variants: As above Delayed phase: No abnormal intracranial enhancement. Review of the MIP images confirms the above findings IMPRESSION: 1. No emergent finding. No flow limiting stenosis or embolic source identified in the head or neck. 2. Very mild atherosclerosis. 3. Known small left frontal cortex infarct that is not detectable by CT. No hemorrhage or visible progression. Electronically Signed   By: Marnee Spring M.D.   On: 11/23/2017 11:44   Mr Brain Wo Contrast  Result Date: 11/21/2017 CLINICAL DATA:  Stroke EXAM: MRI HEAD WITHOUT CONTRAST TECHNIQUE: Multiplanar, multiecho pulse sequences of the brain and surrounding structures were obtained without intravenous contrast. COMPARISON:  CT head 11/21/2017 FINDINGS: Brain: Small focus of restricted diffusion left frontal cortex over the convexity, most compatible with acute infarct. No other areas of acute infarct Mild chronic microvascular ischemia in the white matter. Negative for mass or edema. Ventricle size normal. Several areas of chronic microhemorrhage in the cerebral cortex, bilaterally. Vascular: Normal arterial flow voids Skull and upper cervical spine: Negative Sinuses/Orbits: Mild mucosal edema paranasal sinuses. Cataract removal left. No orbital mass Other: None  IMPRESSION: Small area of acute infarct left frontal cortex over the convexity. Possible embolus. Mild chronic microvascular ischemia in the white matter. Scattered areas of chronic microhemorrhage in the cerebral cortex bilaterally, probable hypertension related. Electronically Signed   By: Marlan Palau M.D.   On: 11/21/2017 19:52   Pelvis Portable  Result Date: 11/20/2017 CLINICAL DATA:  Postop right hip hemiarthroplasty EXAM: PORTABLE PELVIS 1-2 VIEWS COMPARISON:  None. FINDINGS: Right hip hemiarthroplasty in satisfactory position. Associated soft tissue gas and overlying skin staples. Left hip joint space is preserved. Visualized bony pelvis is intact. Mild degenerative changes  at L4-5. IMPRESSION: Right hip hemiarthroplasty in satisfactory position. Electronically Signed   By: Charline Bills M.D.   On: 11/20/2017 12:52   Dg Hip Unilat W Or Wo Pelvis 2-3 Views Right  Result Date: 11/19/2017 CLINICAL DATA:  Pain after fall EXAM: DG HIP (WITH OR WITHOUT PELVIS) 2-3V RIGHT COMPARISON:  None. FINDINGS: There is a fracture through the right femoral neck without femoral head dislocation. No other acute abnormalities identified. IMPRESSION: Right femoral neck fracture. Electronically Signed   By: Gerome Sam III M.D   On: 11/19/2017 18:39      Subjective: Seen this morning prior to procedures.  Denied complaints.  No recurrence of strokelike symptoms or garbled speech.  No pain reported.  Discharge Exam:  Vitals:   11/24/17 1430 11/24/17 1440 11/24/17 1445 11/24/17 1647  BP: (!) 97/47 (!) 113/40 94/63 104/63  Pulse: 91 70 75 60  Resp: 13 12 14 17   Temp:    98.8 F (37.1 C)  TempSrc:    Oral  SpO2: 95% 90% (!) 89% 100%  Weight:      Height:        General exam: Pleasant elderly female, moderately built and nourished, lying comfortably propped up in bed. Respiratory system: Clear to auscultation. Respiratory effort normal. Cardiovascular system: S1 & S2 heard, RRR. No JVD, murmurs, rubs, gallops or clicks. No pedal edema.  Telemetry: Sinus rhythm. Gastrointestinal system: Abdomen is nondistended, soft and nontender. No organomegaly or masses felt. Normal bowel sounds heard. Central nervous system: Alert and oriented. No focal neurological deficits. Extremities: Symmetric 5 x 5 power.  Right hip postop dressing clean and dry. Skin: No rashes, lesions or ulcers Psychiatry: Judgement and insight appear normal. Mood & affect appropriate.      The results of significant diagnostics from this hospitalization (including imaging, microbiology, ancillary and laboratory) are listed below for reference.     Microbiology: Recent Results (from the past 240  hour(s))  MRSA PCR Screening     Status: None   Collection Time: 11/20/17  5:37 AM  Result Value Ref Range Status   MRSA by PCR NEGATIVE NEGATIVE Final    Comment:        The GeneXpert MRSA Assay (FDA approved for NASAL specimens only), is one component of a comprehensive MRSA colonization surveillance program. It is not intended to diagnose MRSA infection nor to guide or monitor treatment for MRSA infections. Performed at [facility], [address], [place], [place] [address]      Labs: CBC: Recent Labs  Lab 11/19/17 1828 11/21/17 0704 11/22/17 0514 11/23/17 0853  WBC 9.5 13.9* 9.7 9.4  NEUTROABS 7.9*  --   --   --   HGB 12.9 11.8* 11.3* 11.1*  HCT 39.5 37.0 35.4* 35.1*  MCV 90.4 93.2 93.9 93.1  PLT 220 198 176 207   Basic Metabolic Panel: Recent Labs  Lab 11/19/17  1828 11/21/17 0704 11/22/17 0514 11/24/17 0503  NA 140 136 139 138  K 3.9 4.0 4.0 4.4  CL 105 104 106 104  CO2 26 25 27 25   GLUCOSE 104* 133* 100* 99  BUN 15 15 20 15   CREATININE 0.67 0.78 0.78 0.67  CALCIUM 9.1 8.6* 8.6* 8.2*   Hgb A1c Recent Labs    11/22/17 0514  HGBA1C 5.6   Lipid Profile Recent Labs    11/22/17 0514  CHOL 139  HDL 51  LDLCALC 64  TRIG 122  CHOLHDL 2.7   I [place] and patient has not utilized opioids recently.  I discussed in detail with patient's son, updated care and answered questions.   Time coordinating discharge: Over 30 minutes  SIGNED:  Marcellus ScottAnand Viliami Bracco, MD, FACP, [facility] Center[facility]. [facility] Pager [phone]  If 7PM-7AM, please contact night-coverage [email or web address] Password [email or web address] 11/24/2017, 6:07 PM

## 2017-11-24 NOTE — Progress Notes (Signed)
  Echocardiogram Echocardiogram Transesophageal has been performed.  Leta JunglingCooper, Anacaren Kohan M 11/24/2017, 2:50 PM

## 2017-11-24 NOTE — CV Procedure (Signed)
Procedure: TEE  Sedation: Versed 2 mg IV, Fentanyl 25 mcg IV  Indication: CVA  Findings: Please see echo section for full report.  Normal LV size with mild LV hypertrophy.  EF 55-60%.  Normal RV size and systolic function.  Trivial TR, peak RV-RA gradient 19 mmHg.  Trivial mitral regurgitation.  Trileaflet aortic valve with no stenosis, no significant regurgitation.  Mildly dilated left atrium, no LA appendage thrombus.  Normal right atrium.  There was an atrial septal aneurysm with PFO.  The PFO appeared to have bidirectional shunting. Normal caliber aorta with minimal plaque.   Possible source of embolus = atrial septal aneurysm with PFO.  To get venous dopplers.   Christy Fletcher 11/24/2017 2:34 PM

## 2017-11-24 NOTE — Progress Notes (Signed)
Christy MedinMargaret S Fletcher to be D/C'd Home per MD order.  Discussed prescriptions and follow up appointments with the patient. Prescriptions given to patient, medication list explained in detail. Pt verbalized understanding.  Allergies as of 11/24/2017   No Known Allergies     Medication List    TAKE these medications   aspirin 81 MG EC tablet Take 1 tablet (81 mg total) by mouth daily. Start taking on:  11/25/2017   calcium carbonate 600 MG Tabs tablet Commonly known as:  OS-CAL Take 1,200 mg by mouth daily.   clopidogrel 75 MG tablet Commonly known as:  PLAVIX Take 1 tablet (75 mg total) by mouth daily. Start taking on:  11/25/2017   docusate sodium 100 MG capsule Commonly known as:  COLACE Take 1 capsule (100 mg total) by mouth 2 (two) times daily.   HYDROcodone-acetaminophen 5-325 MG tablet Commonly known as:  NORCO/VICODIN Take 1-2 tablets by mouth every 6 (six) hours as needed for moderate pain.   lovastatin 20 MG tablet Commonly known as:  MEVACOR Take 20 mg by mouth at bedtime.   senna-docusate 8.6-50 MG tablet Commonly known as:  Senokot-S Take 1 tablet by mouth at bedtime as needed for mild constipation.            Discharge Care Instructions  (From admission, onward)        Start     Ordered   11/23/17 0000  Weight bearing as tolerated     11/23/17 0802      Vitals:   11/24/17 1445 11/24/17 1647  BP: 94/63 104/63  Pulse: 75 60  Resp: 14 17  Temp:  98.8 F (37.1 C)  SpO2: (!) 89% 100%    Skin clean, dry and intact without evidence of skin break down, no evidence of skin tears noted. Aquacel dressing clean, dry, and intact. Gauze and transparent dressing on chest, scant drainage, dry, and intact. IV catheter discontinued intact. Site without signs and symptoms of complications. Dressing and pressure applied. Pt denies pain at this time. No complaints noted.  An After Visit Summary printed and given to the patient. Prescriptions send to pharmacy per  MD. [facility] and D/C home via private auto.  GrenadaBrittany Tinya Cadogan RN

## 2017-11-24 NOTE — Progress Notes (Signed)
Paged Dr. Waymon AmatoHongalgi twice requesting discharge orders for patient. Awaiting intervention

## 2017-11-24 NOTE — Progress Notes (Signed)
Physical Therapy Treatment Patient Details Name: Christy Fletcher MRN: [id number] DOB: 08-10-1941 Today's Date: 11/24/2017    History of Present Illness Pt is a 77 y.o. F with significant PMH of arthritis, diarrhea, hypercholesteremia, and osteopenia admitted with closed fracture of neck of R femur following a fall and is s/p hemiarthroplasty.     PT Comments    Patient is progressing very well towards their physical therapy goals. Overall, patient displays improved balance and gait speed during ambulation. Able to participate in standing therapeutic exercise today with bilateral hand support for lower extremity strengthening. Patient and patient son educated on proper limb positioning. Patient would benefit from further bed mobility training in order to prepare for home and entering/exiting the bed on the right side.     Follow Up Recommendations  Follow surgeon's recommendation for DC plan and follow-up therapies;Outpatient PT;Supervision for mobility/OOB     Equipment Recommendations  None recommended by PT    Recommendations for Other Services       Precautions / Restrictions Precautions Precautions: Fall Restrictions Weight Bearing Restrictions: Yes RLE Weight Bearing: Weight bearing as tolerated    Mobility  Bed Mobility Overal bed mobility: Needs Assistance Bed Mobility: Supine to Sit     Supine to sit: Min assist     General bed mobility comments: Patient is currently modI for supine to sit from L side of bed, but at home patient will be entering and exiting from the right side of the bed. Patient requiring minA at the trunk for supine to sit from the right side of the bed. Instructed patient to roll into sidelying prior to transitioning to sitting.   Transfers Overall transfer level: Needs assistance Equipment used: Rolling walker (2 wheeled) Transfers: Sit to/from Stand Sit to Stand: Supervision         General transfer comment: Patient with increased trunk  flexion with sit to stand and provided VC's for upright posture.    Ambulation/Gait Ambulation/Gait assistance: Min guard Ambulation Distance (Feet): 510 Feet Assistive device: Rolling walker (2 wheeled) Gait Pattern/deviations: Step-through pattern;Decreased weight shift to right;Decreased stance time - right   Gait velocity interpretation: Below normal speed for age/gender General Gait Details: Mod VC's for RW proximity   Stairs            Wheelchair Mobility    Modified Rankin (Stroke Patients Only)       Balance Overall balance assessment: Needs assistance Sitting-balance support: Feet supported;Bilateral upper extremity supported Sitting balance-Leahy Scale: Good     Standing balance support: Bilateral upper extremity supported Standing balance-Leahy Scale: Fair                              Cognition Arousal/Alertness: Awake/alert Behavior During Therapy: WFL for tasks assessed/performed Overall Cognitive Status: Within Functional Limits for tasks assessed                                 General Comments: Following commands. Drowsy throughout session and benefits from VCs to maintain eyes open. Suspected due to pain medicaiton      Exercises General Exercises - Lower Extremity Gluteal Sets: Standing;15 reps Hip ABduction/ADduction: Standing;Right;15 reps Heel Raises: Standing;15 reps;Both Other Exercises Other Exercises: Standing hamstring curls x 15 Other Exercises: Standing R hip extension x 15    General Comments General comments (skin integrity, edema, etc.): Patient son present throughout session. Provided  education on RLE neutral positioning. Pt son interested in use of an abduction pillow and PT discussed with RN to place an order.       Pertinent Vitals/Pain Pain Assessment: No/denies pain    Home Living                      Prior Function            PT Goals (current goals can now be found in the care  plan section) Acute Rehab PT Goals Patient Stated Goal: Go home today PT Goal Formulation: With patient Time For Goal Achievement: 12/05/17 Potential to Achieve Goals: Good Progress towards PT goals: Progressing toward goals    Frequency    Min 5X/week      PT Plan Current plan remains appropriate    Co-evaluation              AM-PAC PT "6 Clicks" Daily Activity  Outcome Measure  Difficulty turning over in bed (including adjusting bedclothes, sheets and blankets)?: A Little Difficulty moving from lying on back to sitting on the side of the bed? : A Little Difficulty sitting down on and standing up from a chair with arms (e.g., wheelchair, bedside commode, etc,.)?: A Lot Help needed moving to and from a bed to chair (including a wheelchair)?: A Little Help needed walking in hospital room?: A Little Help needed climbing 3-5 steps with a railing? : A Little 6 Click Score: 17    End of Session Equipment Utilized During Treatment: Gait belt Activity Tolerance: Patient tolerated treatment well Patient left: with call bell/phone within reach;with family/visitor present;in bed   PT Visit Diagnosis: Unsteadiness on feet (R26.81);History of falling (Z91.81)     Time: 1610-9604 PT Time Calculation (min) (ACUTE ONLY): 30 min  Charges:  $Gait Training: 8-22 mins $Therapeutic Exercise: 8-22 mins                    G Codes:       Laurina Bustle, PT, DPT Acute Rehabilitation Services    Vanetta Mulders 11/24/2017, 10:29 AM

## 2017-11-24 NOTE — Progress Notes (Signed)
   11/24/17 1000  Modified Rankin (Stroke Patients Only)  Pre-Morbid Rankin Score 1  Modified Rankin 3 (Deficits due to R hemiarthroplasty, not stroke. )   Late entry  Laurina Bustlearoline Lennox Dolberry, PT, DPT Acute Rehabilitation Services

## 2017-11-24 NOTE — Progress Notes (Addendum)
Bilateral lower extremity venous duplex completed.No evidence of DVTor superficial thrombosis. Incidental finding possible left popliteal cyst in the popliteal fossa. [employer] ElectricVirginia Constantinos Fletcher, RVS  11/24/2017 3:20 PM

## 2017-11-24 NOTE — Interval H&P Note (Signed)
History and Physical Interval Note:  11/24/2017 2:17 PM  Burnis MedinMargaret S Roppolo  has presented today for surgery, with the diagnosis of stroke  The various methods of treatment have been discussed with the patient and family. After consideration of risks, benefits and other options for treatment, the patient has consented to  Procedure(s): TRANSESOPHAGEAL ECHOCARDIOGRAM (TEE) WITH LOOP (N/A) as a surgical intervention .  The patient's history has been reviewed, patient examined, no change in status, stable for surgery.  I have reviewed the patient's chart and labs.  Questions were answered to the patient's satisfaction.     Daison Braxton [employer]

## 2017-11-25 ENCOUNTER — Encounter (HOSPITAL_COMMUNITY): Payer: Self-pay | Admitting: Cardiology

## 2017-11-26 ENCOUNTER — Encounter (HOSPITAL_COMMUNITY): Payer: Self-pay | Admitting: Cardiology

## 2017-11-28 ENCOUNTER — Telehealth (INDEPENDENT_AMBULATORY_CARE_PROVIDER_SITE_OTHER): Payer: Self-pay | Admitting: Orthopaedic Surgery

## 2017-11-28 ENCOUNTER — Telehealth: Payer: Self-pay | Admitting: Neurology

## 2017-11-28 NOTE — Telephone Encounter (Signed)
Please advise 

## 2017-11-28 NOTE — Telephone Encounter (Signed)
I left voicemail for Amy advising. 

## 2017-11-28 NOTE — Telephone Encounter (Signed)
Ok thanks 

## 2017-11-28 NOTE — Telephone Encounter (Signed)
Amy from [facility] called asking for verbal approval on 3 week 3 for PT. Cb # [phone]

## 2017-11-28 NOTE — Telephone Encounter (Signed)
I returned a phone call from the patient's son Christy Fletcher who informed me that the patient had an episode of epistaxis this morning which was stopped with some local pressure. She is on aspirin and Plavix following a recent stroke. I recommend she stop Plavix and stay on aspirin alone. He was instructed to call back if she had recurrent bleeding. He voiced understanding

## 2017-11-29 ENCOUNTER — Telehealth (INDEPENDENT_AMBULATORY_CARE_PROVIDER_SITE_OTHER): Payer: Self-pay | Admitting: Orthopaedic Surgery

## 2017-11-29 ENCOUNTER — Other Ambulatory Visit: Payer: Self-pay

## 2017-11-29 NOTE — Telephone Encounter (Signed)
Patients son called to ask questions about the waterproof dressing that was put on the 3/6, she had surgery 3/3. He is wondering if they need to leave that on until the follow up appt. He also said Dr. Ophelia CharterYates wanted to see her in a week, the follow up was made for 2 weeks so I moved the appt to 3/19, I offered earlier day but patient had other appt. Please advise about the dressing # [id number]

## 2017-11-29 NOTE — Telephone Encounter (Signed)
I called and spoke with patient's son. She will leave the bandage on until follow up in office. I did offer earlier appt for this week, but he states patient is doing well and does not need to come in sooner. He will call if any problems or questions.

## 2017-11-29 NOTE — Patient Outreach (Signed)
[facility]) Care Management  11/29/2017  [name] [birth year] [id number]     EMMI-STROKE RED ON EMMI ALERT Day #  3 Date: 11/28/17 Red Alert Reason: " Questions/problems with meds? Yes"   Outreach attempt # 1 to patient. Spoke with patient who voices she is doing "very good." Reviewed and addressed red alert with patient. She reports that yesterday morning she had a nosebleed hat lasted about 45mins to an hour and she had trouble getting the bleed to stop. Patient report she was recently started on Plavix. She is aware that this is a side effect of med. Patient voices that her family called MD office to alert them of the incident. She has been told to hold off on taking Plavix until further notice. RN CM reviewed with patient other bleeding precautions and safety measures with taking blood thinner. She voiced understanding. She has follow appts with PCP on 12/02/17 and ortho on 12/06/17. She has not made follow up with neuro. Patient has discharge paperwork with contact info for Neurology office and will follow up with office. She denies any issues with transportation. She voices that she has very supportive spouse and son who are assisting her with medical needs. She voices no further RN CM needs or concerns at this time. Advised patient that they would continue to get automated EMMI-Stroke post discharge calls to assess how they are doing following recent hospitalization and will receive a call from a nurse if any of their responses were abnormal. Patient voiced understanding and was appreciative of f/u call.       Plan: RN CM will notify [facility] administrative assistant of case status.    Antionette Fairyoshanda Yossi Hinchman, RN,BSN,CCM [facility] Care Management Telephonic Care Management Coordinator Direct Phone: [phone] Toll Free: [phone] Fax: [phone]

## 2017-11-29 NOTE — Patient Outreach (Signed)
Patient triggered Red on EMMI Stroke Dashboard, notification sent to:  Roshanda Florance, RN 

## 2017-12-06 ENCOUNTER — Encounter (INDEPENDENT_AMBULATORY_CARE_PROVIDER_SITE_OTHER): Payer: Self-pay | Admitting: Orthopaedic Surgery

## 2017-12-06 ENCOUNTER — Ambulatory Visit (INDEPENDENT_AMBULATORY_CARE_PROVIDER_SITE_OTHER): Payer: MEDICARE

## 2017-12-06 ENCOUNTER — Ambulatory Visit (INDEPENDENT_AMBULATORY_CARE_PROVIDER_SITE_OTHER): Payer: MEDICARE | Admitting: Orthopaedic Surgery

## 2017-12-06 VITALS — BP 141/87 | HR 72

## 2017-12-06 DIAGNOSIS — S72001D Fracture of unspecified part of neck of right femur, subsequent encounter for closed fracture with routine healing: Secondary | ICD-10-CM

## 2017-12-06 NOTE — Progress Notes (Signed)
   Post-Op Visit Note   Patient: Christy Fletcher           Date of Birth: 1941-03-20           MRN: [id number] Visit Date: 12/06/2017 PCP: Gareth MorganKnowlton, Steve, MD   Assessment & Plan:  Chief Complaint:  Chief Complaint  Patient presents with  . Right Hip - Routine Post Op   Visit Diagnoses:  1. Closed fracture of right hip with routine healing, subsequent encounter     Plan: Patient will return in 1 month.  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  Her goal is to resume work activity.  She will continue on increasing her walking distance and should be able to gradually wean to a cane in her left hand as she is walking better and then off the cane return in 1 month.  Staples removed Steri-Strips applied.  Incision looks good.  Follow-Up Instructions: Return in about 1 month (around 01/06/2018).   Orders:  Orders Placed This Encounter  Procedures  . XR HIP UNILAT W OR W/O PELVIS 2-3 VIEWS RIGHT   No orders of the defined types were placed in this encounter.   Imaging: Xr Hip Unilat W Or W/o Pelvis 2-3 Views Right  Result Date: 12/06/2017 AP lateral right hip demonstrates level pelvis leg lengths are identical.  Good position and alignment of the stem with bipolar hemiarthroplasty. Impression: Satisfactory postop hemiarthroplasty for femoral neck fracture.   PMFS History: Patient Active Problem List   Diagnosis Date Noted  . Cerebral embolism with cerebral infarction 11/23/2017  . Osteopenia 11/19/2017  . Arthritis 11/19/2017  . Dyslipidemia 11/19/2017  . Hip fracture (HCC) 11/19/2017  . Decreased range of motion of right elbow 11/19/2013  . Muscle weakness (generalized) 11/19/2013  . Edema 11/19/2013  . Pain in joint, forearm 11/19/2013   Past Medical History:  Diagnosis Date  . Arthritis   . Diarrhea   . Hypercholesteremia   . Osteopenia     Family History  Problem Relation Age of Onset  . Colon cancer Neg Hx     Past Surgical History:    Procedure Laterality Date  . ABDOMINAL HYSTERECTOMY    . CATARACT EXTRACTION    . COLONOSCOPY    . COLONOSCOPY N/A 05/14/2013   Procedure: COLONOSCOPY;  Surgeon: West BaliSandi L Fields, MD;  [facility];  Service: Endoscopy;  Laterality: N/A;  10:30 AM-rescheduled to 8:45am Doris notified pt  . HERNIA REPAIR    . HIP ARTHROPLASTY Right 11/20/2017   Procedure: RIGHT HIP HEMIARTHROPLASTY;  Surgeon: Eldred MangesYates, Mark C, MD;  [facility];  Service: Orthopedics;  Laterality: Right;  . LOOP RECORDER INSERTION N/A 11/24/2017   Procedure: LOOP RECORDER INSERTION;  Surgeon: Regan Lemmingamnitz, Will Martin, MD;  [facility] INVASIVE CV LAB;  Service: Cardiovascular;  Laterality: N/A;  . TEE WITHOUT CARDIOVERSION N/A 11/24/2017   Procedure: TRANSESOPHAGEAL ECHOCARDIOGRAM (TEE) WITH LOOP;  Surgeon: Laurey MoraleMcLean, Dalton S, MD;  [facility];  Service: Cardiovascular;  Laterality: N/A;   Social History   Occupational History  . Not on file  Tobacco Use  . Smoking status: Never Smoker  . Smokeless tobacco: Never Used  Substance and Sexual Activity  . Alcohol use: No  . Drug use: No  . Sexual activity: Not on file

## 2017-12-07 ENCOUNTER — Ambulatory Visit: Payer: Self-pay

## 2017-12-07 ENCOUNTER — Ambulatory Visit (INDEPENDENT_AMBULATORY_CARE_PROVIDER_SITE_OTHER): Payer: Self-pay | Admitting: *Deleted

## 2017-12-07 DIAGNOSIS — I6310 Cerebral infarction due to embolism of unspecified precerebral artery: Secondary | ICD-10-CM

## 2017-12-07 LAB — CUP PACEART INCLINIC DEVICE CHECK
Implantable Pulse Generator Implant Date: 20190307
MDC IDC SESS DTM: 20190320143145

## 2017-12-07 NOTE — Progress Notes (Signed)
Wound Loop check in clinic. Steri-Strips removed, incision edges approximated, no redness or edema, wound well healed. Battery status: Good. R-waves 0.25 mV. 0 symptom episodes, 0 tachy episodes, 0 pause episodes, 0 brady episodes. 0 AF episodes. Monthly summary reports and ROV with WC PRN

## 2017-12-09 ENCOUNTER — Inpatient Hospital Stay (INDEPENDENT_AMBULATORY_CARE_PROVIDER_SITE_OTHER): Payer: MEDICARE | Admitting: Orthopaedic Surgery

## 2017-12-09 ENCOUNTER — Other Ambulatory Visit: Payer: Self-pay

## 2017-12-09 NOTE — Patient Outreach (Signed)
[facility] Care Management  12/09/2017  [name] 05-09-1941 [id number]     EMMI-STROKE RED ON EMMI ALERT Day # 13 Date: 12/08/17 Red Alert Reason: "Feeling worse overall? Yes"   Outreach attempt #1 to patient. Spoke with patient. Reviewed and addressed red alert. Patient stets that machine recorded inaccurate response. She voices that she is doing well. She has had no further issues since she last spoke with RN CM. She voices no RN CM needs or concerns at this time. Advised patient that she has completed post discharge automated EMMI-Stroke calls.       Plan: RN CM will close case at this time.    Antionette Fairyoshanda Seaton Hofmann, RN,BSN,CCM [facility] Care Management Telephonic Care Management Coordinator Direct Phone: [phone] Toll Free: [phone] Fax: [phone]

## 2017-12-26 ENCOUNTER — Ambulatory Visit (INDEPENDENT_AMBULATORY_CARE_PROVIDER_SITE_OTHER): Payer: Medicare HMO | Admitting: *Deleted

## 2017-12-26 DIAGNOSIS — I6310 Cerebral infarction due to embolism of unspecified precerebral artery: Secondary | ICD-10-CM

## 2017-12-27 NOTE — Progress Notes (Signed)
Carelink Summary Report / Loop Recorder 

## 2018-01-10 ENCOUNTER — Ambulatory Visit (INDEPENDENT_AMBULATORY_CARE_PROVIDER_SITE_OTHER): Payer: Medicare HMO | Admitting: Orthopaedic Surgery

## 2018-01-10 ENCOUNTER — Encounter (INDEPENDENT_AMBULATORY_CARE_PROVIDER_SITE_OTHER): Payer: Self-pay | Admitting: Orthopaedic Surgery

## 2018-01-10 VITALS — BP 138/78 | HR 68 | Ht 61.5 in | Wt 139.0 lb

## 2018-01-10 DIAGNOSIS — Z96649 Presence of unspecified artificial hip joint: Secondary | ICD-10-CM

## 2018-01-10 NOTE — Progress Notes (Signed)
   Post-Op Visit Note   Patient: Christy Fletcher           Date of Birth: [birth year]           MRN: [id number] Visit Date: 01/10/2018 PCP: Gareth MorganKnowlton, Steve, MD   Assessment & Plan:post hemiarthroplasty right hip for Femoral neck Fx.   Chief Complaint:  Chief Complaint  Patient presents with  . Right Hip - Routine Post Op    11/20/17 right hip hemiarthroplasty   Visit Diagnoses:  1. Status post hip hemiarthroplasty           Right hip.  Plan: Work slip given for work resumption Jan 18, 2018.  I plan to check her back for a final visit in 2 months.  On return we will obtain standing AP of both knees lateral right knee and sunrise patellar x-ray  Follow-Up Instructions: No follow-ups on file.   Orders:  No orders of the defined types were placed in this encounter.  No orders of the defined types were placed in this encounter.   Imaging: No results found.  PMFS History: Patient Active Problem List   Diagnosis Date Noted  . Cerebral embolism with cerebral infarction 11/23/2017  . Osteopenia 11/19/2017  . Arthritis 11/19/2017  . Dyslipidemia 11/19/2017  . Hip fracture (HCC) 11/19/2017  . Decreased range of motion of right elbow 11/19/2013  . Muscle weakness (generalized) 11/19/2013  . Edema 11/19/2013  . Pain in joint, forearm 11/19/2013   Past Medical History:  Diagnosis Date  . Arthritis   . Diarrhea   . Hypercholesteremia   . Osteopenia     Family History  Problem Relation Age of Onset  . Colon cancer Neg Hx     Past Surgical History:  Procedure Laterality Date  . ABDOMINAL HYSTERECTOMY    . CATARACT EXTRACTION    . COLONOSCOPY    . COLONOSCOPY N/A 05/14/2013   Procedure: COLONOSCOPY;  Surgeon: West BaliSandi L Fields, MD;  [facility];  Service: Endoscopy;  Laterality: N/A;  10:30 AM-rescheduled to 8:45am Doris notified pt  . HERNIA REPAIR    . HIP ARTHROPLASTY Right 11/20/2017   Procedure: RIGHT HIP HEMIARTHROPLASTY;  Surgeon: Eldred MangesYates, Forest Pruden C, MD;   [facility];  Service: Orthopedics;  Laterality: Right;  . LOOP RECORDER INSERTION N/A 11/24/2017   Procedure: LOOP RECORDER INSERTION;  Surgeon: Regan Lemmingamnitz, Will Martin, MD;  [facility]: [facility] INVASIVE CV LAB;  Service: Cardiovascular;  Laterality: N/A;  . TEE WITHOUT CARDIOVERSION N/A 11/24/2017   Procedure: TRANSESOPHAGEAL ECHOCARDIOGRAM (TEE) WITH LOOP;  Surgeon: Laurey MoraleMcLean, Dalton S, MD;  [facility];  Service: Cardiovascular;  Laterality: N/A;   Social History   Occupational History  . Not on file  Tobacco Use  . Smoking status: Never Smoker  . Smokeless tobacco: Never Used  Substance and Sexual Activity  . Alcohol use: No  . Drug use: No  . Sexual activity: Not on file

## 2018-01-26 ENCOUNTER — Ambulatory Visit: Payer: MEDICARE | Admitting: Neurology

## 2018-01-26 ENCOUNTER — Ambulatory Visit (INDEPENDENT_AMBULATORY_CARE_PROVIDER_SITE_OTHER): Payer: Medicare HMO | Admitting: *Deleted

## 2018-01-26 DIAGNOSIS — I6310 Cerebral infarction due to embolism of unspecified precerebral artery: Secondary | ICD-10-CM

## 2018-01-27 LAB — CUP PACEART REMOTE DEVICE CHECK
Date Time Interrogation Session: 20190406201027
MDC IDC PG IMPLANT DT: 20190307

## 2018-01-30 NOTE — Progress Notes (Signed)
Carelink Summary Report / Loop Recorder 

## 2018-02-20 LAB — CUP PACEART REMOTE DEVICE CHECK
Date Time Interrogation Session: 20190509213929
MDC IDC PG IMPLANT DT: 20190307

## 2018-02-28 ENCOUNTER — Ambulatory Visit (INDEPENDENT_AMBULATORY_CARE_PROVIDER_SITE_OTHER): Payer: Medicare HMO | Admitting: *Deleted

## 2018-02-28 DIAGNOSIS — I6310 Cerebral infarction due to embolism of unspecified precerebral artery: Secondary | ICD-10-CM | POA: Diagnosis not present

## 2018-03-01 NOTE — Progress Notes (Signed)
Carelink Summary Report / Loop Recorder 

## 2018-03-07 ENCOUNTER — Ambulatory Visit (INDEPENDENT_AMBULATORY_CARE_PROVIDER_SITE_OTHER): Payer: Medicare HMO | Admitting: Orthopaedic Surgery

## 2018-03-07 ENCOUNTER — Encounter (INDEPENDENT_AMBULATORY_CARE_PROVIDER_SITE_OTHER): Payer: Self-pay | Admitting: Orthopaedic Surgery

## 2018-03-07 VITALS — BP 128/74 | HR 65 | Ht 61.5 in | Wt 138.0 lb

## 2018-03-07 DIAGNOSIS — M170 Bilateral primary osteoarthritis of knee: Secondary | ICD-10-CM

## 2018-03-07 DIAGNOSIS — Z96649 Presence of unspecified artificial hip joint: Secondary | ICD-10-CM

## 2018-03-07 NOTE — Progress Notes (Signed)
Office Visit Note   Patient: Christy Fletcher           Date of Birth: 08-04-1941           MRN: [id number] Visit Date: 03/07/2018              Requested by: Christy MorganKnowlton, Steve, MD [address] [place], [place] [id number] PCP: Christy MorganKnowlton, Steve, MD   Assessment & Plan: Visit Diagnoses:  1. S/P hip hemiarthroplasty   2. Bilateral primary osteoarthritis of knee     Plan: she has been taking care of her husband with recurrent GI bleeding and transfusion of total of more than 12 units. She wants to defer knee OA tx until he is doing better.  She states she is ambulating discomfort in her hip.  Surgery date was 11/20/2017.  Valgus of the right knee with more lateral compartment arthritis.  We deferred reimaging today.  She can return if she has increased problems with her knee once her husband is doing better would like to have her knee reevaluated.  Work note given full duty no restriction on hours or activities.  Follow-Up Instructions: Return if symptoms worsen or fail to improve.   Orders:  No orders of the defined types were placed in this encounter.  No orders of the defined types were placed in this encounter.     Procedures: No procedures performed   Clinical Data: No additional findings.   Subjective: Chief Complaint  Patient presents with  . Right Hip - Follow-up    HPI 77 year old female returns more than 3 months post right hip hemiarthroplasty for fracture.  Working requested note resume full duty all days without limitation of work hours which was given to her.  She still has knee osteoarthritis with worse valgum in her right knee with lateral compartment arthritis.  Her husband is been having problems with anemia severe he has had 3 GI scopes intestine bleed not repeat imaging.  [place].  Currently she is not able to proceed with the treatment for knee until he is doing better and is stable.  Review of Systems 14 point review of systems updated  unchanged from March surgery 2019 for hip.  Of note his previous cerebral infarct due to embolism with aspirin Plavix currently.   Objective: Vital Signs: BP 128/74   Pulse 65   Ht 5' 1.5" (1.562 m)   Wt 138 lb (62.6 kg)   BMI 25.65 kg/m   Physical Exam  Constitutional: She is oriented to person, place, and time. She appears well-developed.  HENT:  Head: Normocephalic.  Right Ear: External ear normal.  Left Ear: External ear normal.  Eyes: Pupils are equal, round, and reactive to light.  Neck: No tracheal deviation present. No thyromegaly present.  Cardiovascular: Normal rate.  Pulmonary/Chest: Effort normal.  Abdominal: Soft.  Neurological: She is alert and oriented to person, place, and time.  Skin: Skin is warm and dry.  Psychiatric: She has a normal mood and affect. Her behavior is normal.    Ortho Exam negative Trendelenburg gait.  Leg lengths are equal.  She has genu valgum right knee.  Crepitus with range of motion both knees 2+ knee effusion on the right the left distal pulses are palpable.  She is amatory without a cane currently.  Specialty Comments:  No specialty comments available.  Imaging: No results found.   PMFS History: Patient Active Problem List   Diagnosis Date Noted  . Cerebral embolism  with cerebral infarction 11/23/2017  . Osteopenia 11/19/2017  . Arthritis 11/19/2017  . Dyslipidemia 11/19/2017  . Hip fracture (HCC) 11/19/2017  . Decreased range of motion of right elbow 11/19/2013  . Muscle weakness (generalized) 11/19/2013  . Edema 11/19/2013  . Pain in joint, forearm 11/19/2013   Past Medical History:  Diagnosis Date  . Arthritis   . Diarrhea   . Hypercholesteremia   . Osteopenia     Family History  Problem Relation Age of Onset  . Colon cancer Neg Hx     Past Surgical History:  Procedure Laterality Date  . ABDOMINAL HYSTERECTOMY    . CATARACT EXTRACTION    . COLONOSCOPY    . COLONOSCOPY N/A 05/14/2013   Procedure: COLONOSCOPY;   Surgeon: Christy Bali, MD;  [facility];  Service: Endoscopy;  Laterality: N/A;  10:30 AM-rescheduled to 8:45am Doris notified pt  . HERNIA REPAIR    . HIP ARTHROPLASTY Right 11/20/2017   Procedure: RIGHT HIP HEMIARTHROPLASTY;  Surgeon: Christy Manges, MD;  Location: [facility] OR;  Service: Orthopedics;  Laterality: Right;  . LOOP RECORDER INSERTION N/A 11/24/2017   Procedure: LOOP RECORDER INSERTION;  Surgeon: Christy Lemming, MD;  Location: [facility] INVASIVE CV LAB;  Service: Cardiovascular;  Laterality: N/A;  . TEE WITHOUT CARDIOVERSION N/A 11/24/2017   Procedure: TRANSESOPHAGEAL ECHOCARDIOGRAM (TEE) WITH LOOP;  Surgeon: Christy Morale, MD;  Location: [facility] ENDOSCOPY;  Service: Cardiovascular;  Laterality: N/A;   Social History   Occupational History  . Not on file  Tobacco Use  . Smoking status: Never Smoker  . Smokeless tobacco: Never Used  Substance and Sexual Activity  . Alcohol use: No  . Drug use: No  . Sexual activity: Not on file

## 2018-03-08 ENCOUNTER — Telehealth: Payer: Self-pay

## 2018-03-08 NOTE — Telephone Encounter (Signed)
Unable to leave vm for patients son to r/s appt. Per Dr .Pearlean BrownieSethi he wants pt to see Jessica stroke NP.

## 2018-03-09 NOTE — Telephone Encounter (Signed)
Spoke with son appt change to [place] stroke NP per Dr. Pearlean BrownieSethi request.

## 2018-03-20 ENCOUNTER — Ambulatory Visit: Payer: Medicare HMO | Admitting: Neurology

## 2018-03-20 ENCOUNTER — Ambulatory Visit (INDEPENDENT_AMBULATORY_CARE_PROVIDER_SITE_OTHER): Payer: Medicare HMO | Admitting: Adult Health

## 2018-03-20 ENCOUNTER — Encounter: Payer: Self-pay | Admitting: Adult Health

## 2018-03-20 VITALS — BP 127/70 | HR 75 | Ht 61.5 in | Wt 137.2 lb

## 2018-03-20 DIAGNOSIS — I6310 Cerebral infarction due to embolism of unspecified precerebral artery: Secondary | ICD-10-CM

## 2018-03-20 DIAGNOSIS — E785 Hyperlipidemia, unspecified: Secondary | ICD-10-CM | POA: Diagnosis not present

## 2018-03-20 NOTE — Patient Instructions (Signed)
Continue aspirin 81 mg daily  and lovastatin  for secondary stroke prevention  Continue to follow up with PCP regarding cholesterol and blood pressure management   We will continue to monitor your loop recorder for atrial fibrillation   Continue to stay active and eat healthy  Continue to monitor blood pressure at home  Maintain strict control of hypertension with blood pressure goal below 130/90, diabetes with hemoglobin A1c goal below 6.5% and cholesterol with LDL cholesterol (bad cholesterol) goal below 70 mg/dL. I also advised the patient to eat a healthy diet with plenty of whole grains, cereals, fruits and vegetables, exercise regularly and maintain ideal body weight.  Followup in the future with me in 6 months or call earlier if needed       [facility]. I hope we have been able to provide you high quality care today.  You may receive a patient satisfaction survey over the next few weeks. We would appreciate your feedback and comments so that we may continue to improve ourselves and the health of our patients.

## 2018-03-20 NOTE — Progress Notes (Signed)
[facility] [address] [place]. [place] [address] [phone]       OFFICE FOLLOW UP NOTE  [name] Date of Birth:  17-Sep-1941 Medical Record Number:  [id number]   Reason for Referral:  hospital stroke follow up 11/22/2017 CHIEF COMPLAINT:  Chief Complaint  Patient presents with  . New Patient (Initial Visit)    Patient reports that she has been doing well. She reports having side effects from the Plavix.     HPI: Christy Fletcher is being seen today for initial visit in the office for left frontal infarct on 11/22/17. History obtained from patient and chart review. Reviewed all radiology images and labs personally.  [name] is a 77 y.o. female w/ hx of osteopenia, dyslipidemia and arthiritis admitted for hip pain following mechanical fall who post op developed a few minutes of garbled speech in hsoptial, which quickly resolved. She was not on tele at time of the event. She did not receive IV t-PA due to fast symptoms resolution. MRI shows an acute infarct.  CT head reviewed and was negative for acute infarct.  CTA head and neck negative for emergent LVO with only mild atherosclerosis.  MRI head reviewed and showed left frontal cortical infarct in the language area.  Carotid Doppler showed bilateral ICA stenosis of 1 to 39% with VAC antegrade.  2D echo showed an EF of 60 to 65% without cardiac source of embolus.  TEE was performed to look for embolic source and did show possible source of  embolus (per notes) with atrial septal aneurysm with PFO.  Lower extremity venous Dopplers performed which was negative for DVT.  Loop recorder was placed on 11/24/2017.  A1c satisfactory at 5.6 and LDL satisfactory at 64.  Patient was not on antithrombotic prior to admission and recommended DAPT for 3 weeks and then Plavix alone.  Due to episode of epistaxis on 11/28/2017, Dr. Pearlean Brownie recommended to stop Plavix and continue on aspirin alone.  Patient is being seen  today for hospital follow-up and overall is doing well from a stroke standpoint.  He continues to take aspirin only without side effects of bleeding or bruising or additional episodes of epistaxis.  Continues to take lovastatin without side effects myalgias.  Blood pressure today satisfactory 127/70.  She is living at home independently with her husband and able to continue on all ADLs and IADLs.  Loop recorder has not shown atrial fibrillation thus far.  Patient denies new or worsening stroke/TIA symptoms.    ROS:   14 system review of systems performed and negative with exception of daytime sleepiness and snoring  PMH:  Past Medical History:  Diagnosis Date  . Arthritis   . Diarrhea   . Hypercholesteremia   . Osteopenia     PSH:  Past Surgical History:  Procedure Laterality Date  . ABDOMINAL HYSTERECTOMY    . CATARACT EXTRACTION    . COLONOSCOPY    . COLONOSCOPY N/A 05/14/2013   Procedure: COLONOSCOPY;  Surgeon: West Bali, MD;  Location: [facility] SUITE;  Service: Endoscopy;  Laterality: N/A;  10:30 AM-rescheduled to 8:45am Doris notified pt  . HERNIA REPAIR    . HIP ARTHROPLASTY Right 11/20/2017   Procedure: RIGHT HIP HEMIARTHROPLASTY;  Surgeon: Eldred Manges, MD;  Location: [facility] OR;  Service: Orthopedics;  Laterality: Right;  . LOOP RECORDER INSERTION N/A 11/24/2017   Procedure: LOOP RECORDER INSERTION;  Surgeon: Regan Lemming, MD;  Location: [facility] INVASIVE CV LAB;  Service: Cardiovascular;  Laterality: N/A;  . TEE WITHOUT CARDIOVERSION N/A 11/24/2017   Procedure: TRANSESOPHAGEAL ECHOCARDIOGRAM (TEE) WITH LOOP;  Surgeon: Laurey MoraleMcLean, Dalton S, MD;  [facility];  Service: Cardiovascular;  Laterality: N/A;    Social History:  Social History   Socioeconomic History  . Marital status: Married    Spouse name: Not on file  . Number of children: Not on file  . Years of education: Not on file  . Highest education level: Not on file  Occupational History  . Not on file    Social Needs  . Financial resource strain: Not on file  . Food insecurity:    Worry: Not on file    Inability: Not on file  . Transportation needs:    Medical: Not on file    Non-medical: Not on file  Tobacco Use  . Smoking status: Never Smoker  . Smokeless tobacco: Never Used  Substance and Sexual Activity  . Alcohol use: No  . Drug use: No  . Sexual activity: Not on file  Lifestyle  . Physical activity:    Days per week: Not on file    Minutes per session: Not on file  . Stress: Not on file  Relationships  . Social connections:    Talks on phone: Not on file    Gets together: Not on file    Attends religious service: Not on file    Active member of club or organization: Not on file    Attends meetings of clubs or organizations: Not on file    Relationship status: Not on file  . Intimate partner violence:    Fear of current or ex partner: Not on file    Emotionally abused: Not on file    Physically abused: Not on file    Forced sexual activity: Not on file  Other Topics Concern  . Not on file  Social History Narrative  . Not on file    Family History:  Family History  Problem Relation Age of Onset  . Colon cancer Neg Hx     Medications:   Current Outpatient Medications on File Prior to Visit  Medication Sig Dispense Refill  . aspirin EC 81 MG EC tablet Take 1 tablet (81 mg total) by mouth daily. 30 tablet 0  . calcium carbonate (OS-CAL) 600 MG TABS tablet Take 1,200 mg by mouth daily.    Marland Kitchen. lovastatin (MEVACOR) 20 MG tablet Take 20 mg by mouth at bedtime.    . clopidogrel (PLAVIX) 75 MG tablet Take 1 tablet (75 mg total) by mouth daily. (Patient not taking: Reported on 12/06/2017) 21 tablet 0  . HYDROcodone-acetaminophen (NORCO/VICODIN) 5-325 MG tablet Take 1-2 tablets by mouth every 6 (six) hours as needed for moderate pain. (Patient not taking: Reported on 03/07/2018) 30 tablet 0   No current facility-administered medications on file prior to visit.      Allergies:   Allergies  Allergen Reactions  . Plavix [Clopidogrel Bisulfate]      Physical Exam  Vitals:   03/20/18 1445  BP: 127/70  Pulse: 75  Weight: 137 lb 3.2 oz (62.2 kg)  Height: 5' 1.5" (1.562 m)   Body mass index is 25.5 kg/m. No exam data present  General: well developed, well nourished, pleasant elderly Caucasian female, seated, in no evident distress Head: head normocephalic and atraumatic.   Neck: supple with no carotid or supraclavicular bruits Cardiovascular: regular rate and rhythm, no murmurs Musculoskeletal: no deformity Skin:  no rash/petichiae Vascular:  Normal pulses all extremities  Neurologic Exam Mental Status: Awake and fully alert. Oriented to place and time. Recent and remote memory intact. Attention span, concentration and fund of knowledge appropriate. Mood and affect appropriate.  Cranial Nerves: Fundoscopic exam reveals sharp disc margins. Pupils equal, briskly reactive to light. Extraocular movements full without nystagmus. Visual fields full to confrontation. Hearing intact. Facial sensation intact. Face, tongue, palate moves normally and symmetrically.  Motor: Normal bulk and tone. Normal strength in all tested extremity muscles. Sensory.: intact to touch , pinprick , position and vibratory sensation.  Coordination: Rapid alternating movements normal in all extremities. Finger-to-nose and heel-to-shin performed accurately bilaterally. Gait and Station: Arises from chair without difficulty. Stance is normal. Gait demonstrates normal stride length and balance . Able to heel, toe and tandem walk without difficulty.  Reflexes: 1+ and symmetric. Toes downgoing.    NIHSS 0 Modified Rankin 0    Diagnostic Data (Labs, Imaging, Testing)  CT HEAD WO CONTRAST 11/21/17 IMPRESSION: Normal noncontrast CT HEAD for age.  MR BRAIN WO CONTRAST 11/21/17 IMPRESSION: Small area of acute infarct left frontal cortex over the convexity. Possible  embolus. Mild chronic microvascular ischemia in the white matter. Scattered areas of chronic microhemorrhage in the cerebral cortex bilaterally, probable hypertension related.  CT ANGIO HEAD W OR WO CONTRAST CT ANGIO NECK W OR WO CONTRAST 11/23/17 IMPRESSION: 1. No emergent finding. No flow limiting stenosis or embolic source identified in the head or neck. 2. Very mild atherosclerosis. 3. Known small left frontal cortex infarct that is not detectable by CT. No hemorrhage or visible progression.  ECHOCARDIOGRAM 11/22/17 Study Conclusions - Left ventricle: The cavity size was normal. Systolic function was   normal. The estimated ejection fraction was in the range of 60%   to 65%. Wall motion was normal; there were no regional wall   motion abnormalities. Doppler parameters are consistent with   abnormal left ventricular relaxation (grade 1 diastolic   dysfunction). - Atrial septum: A patent foramen ovale cannot be excluded. There   was an atrial septal aneurysm. - Tricuspid valve: There was moderate regurgitation directed   centrally. - Pulmonary arteries: Systolic pressure was mildly increased. PA   peak pressure: 42 mm Hg (S). Recommendations:  Consider saline contrast study +/- transesophageal echocardiography if clinically indicated in order to exclude right to left atrial shunt.  ECHO TEE 11/24/17 Study Conclusions - Left ventricle: The cavity size was normal. Wall thickness was   increased in a pattern of mild LVH. Systolic function was normal.   The estimated ejection fraction was in the range of 55% to 60%.   Wall motion was normal; there were no regional wall motion   abnormalities. - Aortic valve: There was no stenosis. - Aorta: Normal caliber aorta with minimal plaque. - Mitral valve: There was trivial regurgitation. - Left atrium: The atrium was mildly dilated. No evidence of   thrombus in the atrial cavity or appendage. - Right ventricle: The cavity size was  normal. Systolic function   was normal. - Right atrium: No evidence of thrombus in the atrial cavity or   appendage. - Atrial septum: There was an atrial septal aneurysm with PFO. The   PFO appeared to have bidirectional shunting. - Tricuspid valve: Trivial TR, peak RV-RA gradient 19 mmHg. Impressions: - Possible source of embolus = atrial septal aneurysm with PFO. To   get venous dopplers.  VAS [place] LOWER EXTREMITY VENOUS BILAT (DVT) 11/24/17 Final Interpretation: Right: There is no evidence of deep  vein thrombosis in the lower extremity.There is no evidence of superficial venous thrombosis. No cystic structure found in the popliteal fossa. Left: There is no evidence of deep vein thrombosis in the lower extremity.There is no evidence of superficial venous thrombosis. A cystic structure is found in the popliteal fossa.      ASSESSMENT: Christy Fletcher is a 77 y.o. year old female here with left frontal infarct on 11/24/17 secondary to unknown source. Vascular risk factors include HLD and HTN.     PLAN: -Continue aspirin 81 mg daily  and lovastatin  for secondary stroke prevention -F/u with PCP regarding your HLD and HTN management -continue to monitor BP at home -continue to monitor loop recorder to r/o atrial fibrillation -Maintain strict control of hypertension with blood pressure goal below 130/90, diabetes with hemoglobin A1c goal below 6.5% and cholesterol with LDL cholesterol (bad cholesterol) goal below 70 mg/dL. I also advised the patient to eat a healthy diet with plenty of whole grains, cereals, fruits and vegetables, exercise regularly and maintain ideal body weight.  Follow up in 6 months or call earlier if needed   Greater than 50% of time during this 25 minute visit was spent on counseling,explanation of diagnosis of left frontal infarct, reviewing risk factor management of HLD and HTN, planning of further management, discussion with patient and family and coordination of  care    George Hugh, [facility]  [facility] [address] [place], [place] [address]  Phone [phone] Fax [phone]

## 2018-03-27 NOTE — Progress Notes (Signed)
I agree with the above plan 

## 2018-04-03 ENCOUNTER — Ambulatory Visit (INDEPENDENT_AMBULATORY_CARE_PROVIDER_SITE_OTHER): Payer: Medicare HMO | Admitting: *Deleted

## 2018-04-03 DIAGNOSIS — I6310 Cerebral infarction due to embolism of unspecified precerebral artery: Secondary | ICD-10-CM

## 2018-04-04 NOTE — Progress Notes (Signed)
Carelink Summary Report / Loop Recorder 

## 2018-04-06 LAB — CUP PACEART REMOTE DEVICE CHECK
Implantable Pulse Generator Implant Date: 20190307
MDC IDC SESS DTM: 20190611213947

## 2018-05-05 ENCOUNTER — Ambulatory Visit (INDEPENDENT_AMBULATORY_CARE_PROVIDER_SITE_OTHER): Payer: Medicare HMO | Admitting: *Deleted

## 2018-05-05 DIAGNOSIS — I6310 Cerebral infarction due to embolism of unspecified precerebral artery: Secondary | ICD-10-CM | POA: Diagnosis not present

## 2018-05-08 NOTE — Progress Notes (Signed)
Carelink Summary Report / Loop Recorder 

## 2018-05-22 LAB — CUP PACEART REMOTE DEVICE CHECK
Date Time Interrogation Session: 20190714224118
MDC IDC PG IMPLANT DT: 20190307

## 2018-06-07 ENCOUNTER — Ambulatory Visit (INDEPENDENT_AMBULATORY_CARE_PROVIDER_SITE_OTHER): Payer: Medicare HMO | Admitting: *Deleted

## 2018-06-07 DIAGNOSIS — I6310 Cerebral infarction due to embolism of unspecified precerebral artery: Secondary | ICD-10-CM

## 2018-06-08 NOTE — Progress Notes (Signed)
Carelink Summary Report / Loop Recorder 

## 2018-06-12 LAB — CUP PACEART REMOTE DEVICE CHECK
Date Time Interrogation Session: 20190816223657
Implantable Pulse Generator Implant Date: 20190307

## 2018-06-19 LAB — CUP PACEART REMOTE DEVICE CHECK
MDC IDC PG IMPLANT DT: 20190307
MDC IDC SESS DTM: 20190918233614

## 2018-07-10 ENCOUNTER — Ambulatory Visit (INDEPENDENT_AMBULATORY_CARE_PROVIDER_SITE_OTHER): Payer: Medicare HMO | Admitting: *Deleted

## 2018-07-10 DIAGNOSIS — I6310 Cerebral infarction due to embolism of unspecified precerebral artery: Secondary | ICD-10-CM | POA: Diagnosis not present

## 2018-07-11 NOTE — Progress Notes (Signed)
Carelink Summary Report / Loop Recorder 

## 2018-07-12 ENCOUNTER — Other Ambulatory Visit (HOSPITAL_COMMUNITY): Payer: Self-pay | Admitting: Family Medicine

## 2018-07-12 DIAGNOSIS — Z1231 Encounter for screening mammogram for malignant neoplasm of breast: Secondary | ICD-10-CM

## 2018-07-17 ENCOUNTER — Ambulatory Visit (HOSPITAL_COMMUNITY): Payer: Self-pay

## 2018-07-20 ENCOUNTER — Ambulatory Visit (HOSPITAL_COMMUNITY)
Admission: RE | Admit: 2018-07-20 | Discharge: 2018-07-20 | Disposition: A | Discharge status: Home / Self Care | Payer: Medicare HMO | Source: Ambulatory Visit | Attending: Family Medicine | Admitting: Family Medicine

## 2018-07-20 DIAGNOSIS — Z1231 Encounter for screening mammogram for malignant neoplasm of breast: Secondary | ICD-10-CM | POA: Diagnosis present

## 2018-07-26 ENCOUNTER — Telehealth (INDEPENDENT_AMBULATORY_CARE_PROVIDER_SITE_OTHER): Payer: Self-pay | Admitting: Orthopaedic Surgery

## 2018-07-26 NOTE — Telephone Encounter (Signed)
Patient had teeth cleaned today and was told she needed medication first because any further dental work will be done- she had hip replacement on November 19, 2017.  Please call patient to advise [phone]

## 2018-07-26 NOTE — Telephone Encounter (Signed)
I called,she does not need.

## 2018-07-26 NOTE — Telephone Encounter (Signed)
Please advise. Thanks.  

## 2018-08-02 LAB — CUP PACEART REMOTE DEVICE CHECK
Date Time Interrogation Session: 20191021233947
MDC IDC PG IMPLANT DT: 20190307

## 2018-08-14 ENCOUNTER — Ambulatory Visit (INDEPENDENT_AMBULATORY_CARE_PROVIDER_SITE_OTHER): Payer: Medicare HMO

## 2018-08-14 DIAGNOSIS — I6310 Cerebral infarction due to embolism of unspecified precerebral artery: Secondary | ICD-10-CM | POA: Diagnosis not present

## 2018-08-14 NOTE — Progress Notes (Signed)
Carelink Summary Report / Loop Recorder 

## 2018-09-14 ENCOUNTER — Ambulatory Visit: Payer: Medicare HMO

## 2018-09-15 LAB — CUP PACEART REMOTE DEVICE CHECK
Date Time Interrogation Session: 20191227013954
MDC IDC PG IMPLANT DT: 20190307

## 2018-09-25 ENCOUNTER — Ambulatory Visit: Payer: Medicare HMO | Admitting: Adult Health

## 2018-09-25 ENCOUNTER — Encounter: Payer: Self-pay | Admitting: Adult Health

## 2018-09-25 VITALS — BP 132/65 | HR 72 | Ht 61.5 in | Wt 135.0 lb

## 2018-09-25 DIAGNOSIS — I63412 Cerebral infarction due to embolism of left middle cerebral artery: Secondary | ICD-10-CM | POA: Diagnosis not present

## 2018-09-25 DIAGNOSIS — E785 Hyperlipidemia, unspecified: Secondary | ICD-10-CM | POA: Diagnosis not present

## 2018-09-25 NOTE — Progress Notes (Signed)
[facility] [address] [place]. [place] [id number] [phone]       OFFICE FOLLOW UP NOTE  [name] Date of Birth:  [birth year] Medical Record Number:  [id number]   Reason for Referral:  hospital stroke follow up 11/22/2017 CHIEF COMPLAINT:  Chief Complaint  Patient presents with  . Follow-up    Stroke follow  up room in back hallway patient with     HPI:  Interval history 09/25/2018: Patient is being seen today for follow-up for left frontal cortical infarct on 11/22/2017.  She continues to do well from a stroke standpoint without recurring symptoms or residual deficit.  She continues on aspirin without side effects of bleeding or bruising.  Continues on lovastatin without side effects of myalgias.  Blood pressure today 132/65.  Loop recorder has not shown atrial fibrillation thus far.  No further questions or concerns.  Denies new or worsening stroke/TIA symptoms.  Initial visit 03/20/2018:  Christy Fletcher is being seen today for initial visit in the office for left frontal infarct on 11/22/17. History obtained from patient and chart review. Reviewed all radiology images and labs personally.  Christy Fletcher is a 78 y.o. female w/ hx of osteopenia, dyslipidemia and arthiritis admitted for hip pain following mechanical fall who post op developed a few minutes of garbled speech in hsoptial, which quickly resolved. She was not on tele at time of the event. She did not receive IV t-PA due to fast symptoms resolution. MRI shows an acute infarct.  CT head reviewed and was negative for acute infarct.  CTA head and neck negative for emergent LVO with only mild atherosclerosis.  MRI head reviewed and showed left frontal cortical infarct in the language area.  Carotid Doppler showed bilateral ICA stenosis of 1 to 39% with VAC antegrade.  2D echo showed an EF of 60 to 65% without cardiac source of embolus.  TEE was performed to look for embolic source and did show  possible source of  embolus (per notes) with atrial septal aneurysm with PFO.  Lower extremity venous Dopplers performed which was negative for DVT.  Loop recorder was placed on 11/24/2017.  A1c satisfactory at 5.6 and LDL satisfactory at 64.  Patient was not on antithrombotic prior to admission and recommended DAPT for 3 weeks and then Plavix alone.  Due to episode of epistaxis on 11/28/2017, Dr. Pearlean BrownieSethi recommended to stop Plavix and continue on aspirin alone.  Patient is being seen today for hospital follow-up and overall is doing well from a stroke standpoint.  He continues to take aspirin only without side effects of bleeding or bruising or additional episodes of epistaxis.  Continues to take lovastatin without side effects myalgias.  Blood pressure today satisfactory 127/70.  She is living at home independently with her husband and able to continue on all ADLs and IADLs.  Loop recorder has not shown atrial fibrillation thus far.  Patient denies new or worsening stroke/TIA symptoms.    ROS:   14 system review of systems performed and negative with exception of no complaints  PMH:  Past Medical History:  Diagnosis Date  . Arthritis   . Diarrhea   . Hypercholesteremia   . Osteopenia     PSH:  Past Surgical History:  Procedure Laterality Date  . ABDOMINAL HYSTERECTOMY    . CATARACT EXTRACTION    . COLONOSCOPY    . COLONOSCOPY N/A 05/14/2013   Procedure: COLONOSCOPY;  Surgeon: West BaliSandi L Fields, MD;  Location:  [facility];  Service: Endoscopy;  Laterality: N/A;  10:30 AM-rescheduled to 8:45am Doris notified pt  . HERNIA REPAIR    . HIP ARTHROPLASTY Right 11/20/2017   Procedure: RIGHT HIP HEMIARTHROPLASTY;  Surgeon: Eldred Manges, MD;  Location: [facility] OR;  Service: Orthopedics;  Laterality: Right;  . LOOP RECORDER INSERTION N/A 11/24/2017   Procedure: LOOP RECORDER INSERTION;  Surgeon: Regan Lemming, MD;  Location: [facility] INVASIVE CV LAB;  Service: Cardiovascular;  Laterality: N/A;  . TEE  WITHOUT CARDIOVERSION N/A 11/24/2017   Procedure: TRANSESOPHAGEAL ECHOCARDIOGRAM (TEE) WITH LOOP;  Surgeon: Laurey Morale, MD;  Location: [facility] ENDOSCOPY;  Service: Cardiovascular;  Laterality: N/A;    Social History:  Social History   Socioeconomic History  . Marital status: Married    Spouse name: Not on file  . Number of children: Not on file  . Years of education: Not on file  . Highest education level: Not on file  Occupational History  . Not on file  Social Needs  . Financial resource strain: Not on file  . Food insecurity:    Worry: Not on file    Inability: Not on file  . Transportation needs:    Medical: Not on file    Non-medical: Not on file  Tobacco Use  . Smoking status: Never Smoker  . Smokeless tobacco: Never Used  Substance and Sexual Activity  . Alcohol use: No  . Drug use: No  . Sexual activity: Not on file  Lifestyle  . Physical activity:    Days per week: Not on file    Minutes per session: Not on file  . Stress: Not on file  Relationships  . Social connections:    Talks on phone: Not on file    Gets together: Not on file    Attends religious service: Not on file    Active member of club or organization: Not on file    Attends meetings of clubs or organizations: Not on file    Relationship status: Not on file  . Intimate partner violence:    Fear of current or ex partner: Not on file    Emotionally abused: Not on file    Physically abused: Not on file    Forced sexual activity: Not on file  Other Topics Concern  . Not on file  Social History Narrative  . Not on file    Family History:  Family History  Problem Relation Age of Onset  . Colon cancer Neg Hx     Medications:   Current Outpatient Medications on File Prior to Visit  Medication Sig Dispense Refill  . aspirin EC 81 MG EC tablet Take 1 tablet (81 mg total) by mouth daily. 30 tablet 0  . calcium carbonate (OS-CAL) 600 MG TABS tablet Take 1,200 mg by mouth daily.    Marland Kitchen lovastatin  (MEVACOR) 20 MG tablet Take 20 mg by mouth at bedtime.     No current facility-administered medications on file prior to visit.     Allergies:   Allergies  Allergen Reactions  . Plavix [Clopidogrel Bisulfate]      Physical Exam  Vitals:   09/25/18 1510  BP: 132/65  Pulse: 72  Weight: 135 lb (61.2 kg)  Height: 5' 1.5" (1.562 m)   Body mass index is 25.09 kg/m. No exam data present  General: well developed, well nourished, pleasant elderly Caucasian female, seated, in no evident distress Head: head normocephalic and atraumatic.   Neck: supple with no carotid  or supraclavicular bruits Cardiovascular: regular rate and rhythm, no murmurs Musculoskeletal: no deformity Skin:  no rash/petichiae Vascular:  Normal pulses all extremities  Neurologic Exam Mental Status: Awake and fully alert. Oriented to place and time. Recent and remote memory intact. Attention span, concentration and fund of knowledge appropriate. Mood and affect appropriate.  Cranial Nerves: Pupils equal, briskly reactive to light. Extraocular movements full without nystagmus. Visual fields full to confrontation. Hearing intact. Facial sensation intact. Face, tongue, palate moves normally and symmetrically.  Motor: Normal bulk and tone. Normal strength in all tested extremity muscles. Sensory.: intact to touch , pinprick , position and vibratory sensation.  Coordination: Rapid alternating movements normal in all extremities. Finger-to-nose and heel-to-shin performed accurately bilaterally. Gait and Station: Arises from chair without difficulty. Stance is normal. Gait demonstrates normal stride length and balance . Able to heel, toe and tandem walk without difficulty.  Reflexes: 1+ and symmetric. Toes downgoing.       Diagnostic Data (Labs, Imaging, Testing)  CT HEAD WO CONTRAST 11/21/17 IMPRESSION: Normal noncontrast CT HEAD for age.  MR BRAIN WO CONTRAST 11/21/17 IMPRESSION: Small area of acute infarct  left frontal cortex over the convexity. Possible embolus. Mild chronic microvascular ischemia in the white matter. Scattered areas of chronic microhemorrhage in the cerebral cortex bilaterally, probable hypertension related.  CT ANGIO HEAD W OR WO CONTRAST CT ANGIO NECK W OR WO CONTRAST 11/23/17 IMPRESSION: 1. No emergent finding. No flow limiting stenosis or embolic source identified in the head or neck. 2. Very mild atherosclerosis. 3. Known small left frontal cortex infarct that is not detectable by CT. No hemorrhage or visible progression.  ECHOCARDIOGRAM 11/22/17 Study Conclusions - Left ventricle: The cavity size was normal. Systolic function was   normal. The estimated ejection fraction was in the range of 60%   to 65%. Wall motion was normal; there were no regional wall   motion abnormalities. Doppler parameters are consistent with   abnormal left ventricular relaxation (grade 1 diastolic   dysfunction). - Atrial septum: A patent foramen ovale cannot be excluded. There   was an atrial septal aneurysm. - Tricuspid valve: There was moderate regurgitation directed   centrally. - Pulmonary arteries: Systolic pressure was mildly increased. PA   peak pressure: 42 mm Hg (S). Recommendations:  Consider saline contrast study +/- transesophageal echocardiography if clinically indicated in order to exclude right to left atrial shunt.  ECHO TEE 11/24/17 Study Conclusions - Left ventricle: The cavity size was normal. Wall thickness was   increased in a pattern of mild LVH. Systolic function was normal.   The estimated ejection fraction was in the range of 55% to 60%.   Wall motion was normal; there were no regional wall motion   abnormalities. - Aortic valve: There was no stenosis. - Aorta: Normal caliber aorta with minimal plaque. - Mitral valve: There was trivial regurgitation. - Left atrium: The atrium was mildly dilated. No evidence of   thrombus in the atrial cavity or  appendage. - Right ventricle: The cavity size was normal. Systolic function   was normal. - Right atrium: No evidence of thrombus in the atrial cavity or   appendage. - Atrial septum: There was an atrial septal aneurysm with PFO. The   PFO appeared to have bidirectional shunting. - Tricuspid valve: Trivial TR, peak RV-RA gradient 19 mmHg. Impressions: - Possible source of embolus = atrial septal aneurysm with PFO. To   get venous dopplers.  VAS [place] LOWER EXTREMITY VENOUS BILAT (DVT) 11/24/17 Final  Interpretation: Right: There is no evidence of deep vein thrombosis in the lower extremity.There is no evidence of superficial venous thrombosis. No cystic structure found in the popliteal fossa. Left: There is no evidence of deep vein thrombosis in the lower extremity.There is no evidence of superficial venous thrombosis. A cystic structure is found in the popliteal fossa.      ASSESSMENT: Christy Fletcher is a 78 y.o. year old female here with left frontal infarct on 11/24/17 secondary to unknown source. Vascular risk factors include HLD and HTN.  Patient is being seen today for 3617-month follow-up visit and overall continues to do well without recurring symptoms or residual deficit.    PLAN: -Continue aspirin 81 mg daily  and lovastatin  for secondary stroke prevention -F/u with PCP regarding your HLD and HTN management -has appointment scheduled with PCP on 02/05/2019 and request lipid panel obtained to ensure adequate HLD management -continue to monitor BP at home -continue to monitor loop recorder to r/o atrial fibrillation -Maintain strict control of hypertension with blood pressure goal below 130/90, diabetes with hemoglobin A1c goal below 6.5% and cholesterol with LDL cholesterol (bad cholesterol) goal below 70 mg/dL. I also advised the patient to eat a healthy diet with plenty of whole grains, cereals, fruits and vegetables, exercise regularly and maintain ideal body weight.  Follow up in  6 months or call earlier if needed   Greater than 50% of time during this 25 minute visit was spent on counseling,explanation of diagnosis of left frontal infarct, reviewing risk factor management of HLD and HTN, planning of further management, discussion with patient and family and coordination of care    George HughJessica Tateanna Bach, [facility] IncGNP-BC  [facility] [address] [place], [place] [id number]  Phone [phone] Fax [phone]

## 2018-09-25 NOTE — Patient Instructions (Signed)
Continue aspirin 81 mg daily  and lovastatin  for secondary stroke prevention  Continue to follow up with PCP regarding cholesterol and blood pressure management   Continue to stay active and maintain a healthy diet  Continue to monitor blood pressure at home  Maintain strict control of hypertension with blood pressure goal below 130/90, diabetes with hemoglobin A1c goal below 6.5% and cholesterol with LDL cholesterol (bad cholesterol) goal below 70 mg/dL. I also advised the patient to eat a healthy diet with plenty of whole grains, cereals, fruits and vegetables, exercise regularly and maintain ideal body weight.  Followup in the future with me in 6 months or call earlier if needed       Thank you for coming to see [place] at [facility]. I hope we have been able to provide you high quality care today.  You may receive a patient satisfaction survey over the next few weeks. We would appreciate your feedback and comments so that we may continue to improve ourselves and the health of our patients.

## 2018-10-01 LAB — CUP PACEART REMOTE DEVICE CHECK
Date Time Interrogation Session: 20191124004038
Implantable Pulse Generator Implant Date: 20190307

## 2018-10-06 NOTE — Progress Notes (Signed)
I agree with the above plan 

## 2018-10-17 ENCOUNTER — Ambulatory Visit (INDEPENDENT_AMBULATORY_CARE_PROVIDER_SITE_OTHER): Payer: Medicare HMO

## 2018-10-17 DIAGNOSIS — I6310 Cerebral infarction due to embolism of unspecified precerebral artery: Secondary | ICD-10-CM | POA: Diagnosis not present

## 2018-10-18 NOTE — Progress Notes (Signed)
Carelink Summary Report / Loop Recorder 

## 2018-10-19 ENCOUNTER — Ambulatory Visit (INDEPENDENT_AMBULATORY_CARE_PROVIDER_SITE_OTHER): Payer: Medicare HMO | Admitting: Otolaryngology

## 2018-10-19 DIAGNOSIS — H6123 Impacted cerumen, bilateral: Secondary | ICD-10-CM

## 2018-10-20 LAB — CUP PACEART REMOTE DEVICE CHECK
Date Time Interrogation Session: 20200129013545
MDC IDC PG IMPLANT DT: 20190307

## 2018-11-20 ENCOUNTER — Ambulatory Visit (INDEPENDENT_AMBULATORY_CARE_PROVIDER_SITE_OTHER): Payer: Medicare HMO | Admitting: *Deleted

## 2018-11-20 DIAGNOSIS — I6310 Cerebral infarction due to embolism of unspecified precerebral artery: Secondary | ICD-10-CM

## 2018-11-20 LAB — CUP PACEART REMOTE DEVICE CHECK
Date Time Interrogation Session: 20200302013745
Implantable Pulse Generator Implant Date: 20190307

## 2018-11-27 NOTE — Progress Notes (Signed)
Carelink Summary Report / Loop Recorder 

## 2018-12-22 ENCOUNTER — Ambulatory Visit (INDEPENDENT_AMBULATORY_CARE_PROVIDER_SITE_OTHER): Payer: Medicare HMO | Admitting: *Deleted

## 2018-12-22 ENCOUNTER — Other Ambulatory Visit: Payer: Self-pay

## 2018-12-22 DIAGNOSIS — I6310 Cerebral infarction due to embolism of unspecified precerebral artery: Secondary | ICD-10-CM

## 2018-12-23 LAB — CUP PACEART REMOTE DEVICE CHECK
Date Time Interrogation Session: 20200404020607
Implantable Pulse Generator Implant Date: 20190307

## 2018-12-28 NOTE — Progress Notes (Signed)
Carelink Summary Report / Loop Recorder 

## 2019-01-31 ENCOUNTER — Other Ambulatory Visit: Payer: Self-pay

## 2019-01-31 ENCOUNTER — Ambulatory Visit (INDEPENDENT_AMBULATORY_CARE_PROVIDER_SITE_OTHER): Payer: Medicare HMO | Admitting: *Deleted

## 2019-01-31 DIAGNOSIS — I6310 Cerebral infarction due to embolism of unspecified precerebral artery: Secondary | ICD-10-CM

## 2019-01-31 LAB — CUP PACEART REMOTE DEVICE CHECK
Date Time Interrogation Session: 20200513074220
Implantable Pulse Generator Implant Date: 20190307

## 2019-02-13 LAB — LAB REPORT - SCANNED: EGFR: 81

## 2019-02-20 NOTE — Progress Notes (Signed)
Carelink Summary Report / Loop Recorder 

## 2019-03-05 ENCOUNTER — Ambulatory Visit (INDEPENDENT_AMBULATORY_CARE_PROVIDER_SITE_OTHER): Payer: Medicare HMO | Admitting: *Deleted

## 2019-03-05 DIAGNOSIS — I6310 Cerebral infarction due to embolism of unspecified precerebral artery: Secondary | ICD-10-CM | POA: Diagnosis not present

## 2019-03-05 LAB — CUP PACEART REMOTE DEVICE CHECK
Date Time Interrogation Session: 20200615094151
Implantable Pulse Generator Implant Date: 20190307

## 2019-03-15 NOTE — Progress Notes (Signed)
Carelink Summary Report / Loop Recorder 

## 2019-03-26 ENCOUNTER — Ambulatory Visit: Payer: Medicare HMO | Admitting: Adult Health

## 2019-04-08 LAB — CUP PACEART REMOTE DEVICE CHECK
Date Time Interrogation Session: 20200718093534
Implantable Pulse Generator Implant Date: 20190307

## 2019-04-09 ENCOUNTER — Ambulatory Visit (INDEPENDENT_AMBULATORY_CARE_PROVIDER_SITE_OTHER): Payer: Medicare HMO | Admitting: *Deleted

## 2019-04-09 DIAGNOSIS — I6310 Cerebral infarction due to embolism of unspecified precerebral artery: Secondary | ICD-10-CM | POA: Diagnosis not present

## 2019-04-25 NOTE — Progress Notes (Signed)
Carelink Summary Report / Loop Recorder 

## 2019-05-02 ENCOUNTER — Telehealth: Payer: Self-pay | Admitting: *Deleted

## 2019-05-02 NOTE — Telephone Encounter (Signed)
Spoke with patient regarding tachy episode on LINQ from 05/01/19 at 23:08, duration 7sec. Pt reports she was asleep at that time, denies any recent cardiac symptoms. Confirmed med list accurate. Advised will send to Dr. Curt Bears for review and call back if any new recommendations. Pt agreeable to this plan and denies questions at this time.

## 2019-05-02 NOTE — Telephone Encounter (Signed)
Normal LVEF. Would start metoprolol 25 mg BID for NSVT.

## 2019-05-04 NOTE — Telephone Encounter (Signed)
Voicemail not set up, unable to reach pt or leave message to call back

## 2019-05-08 IMAGING — DX DG CHEST 1V
1 series · 1 of 1 positions shown · non-contrast
Comparison: June 26, 2010

CLINICAL DATA: Fall.

EXAM:
CHEST 1 VIEW

[chest pa]
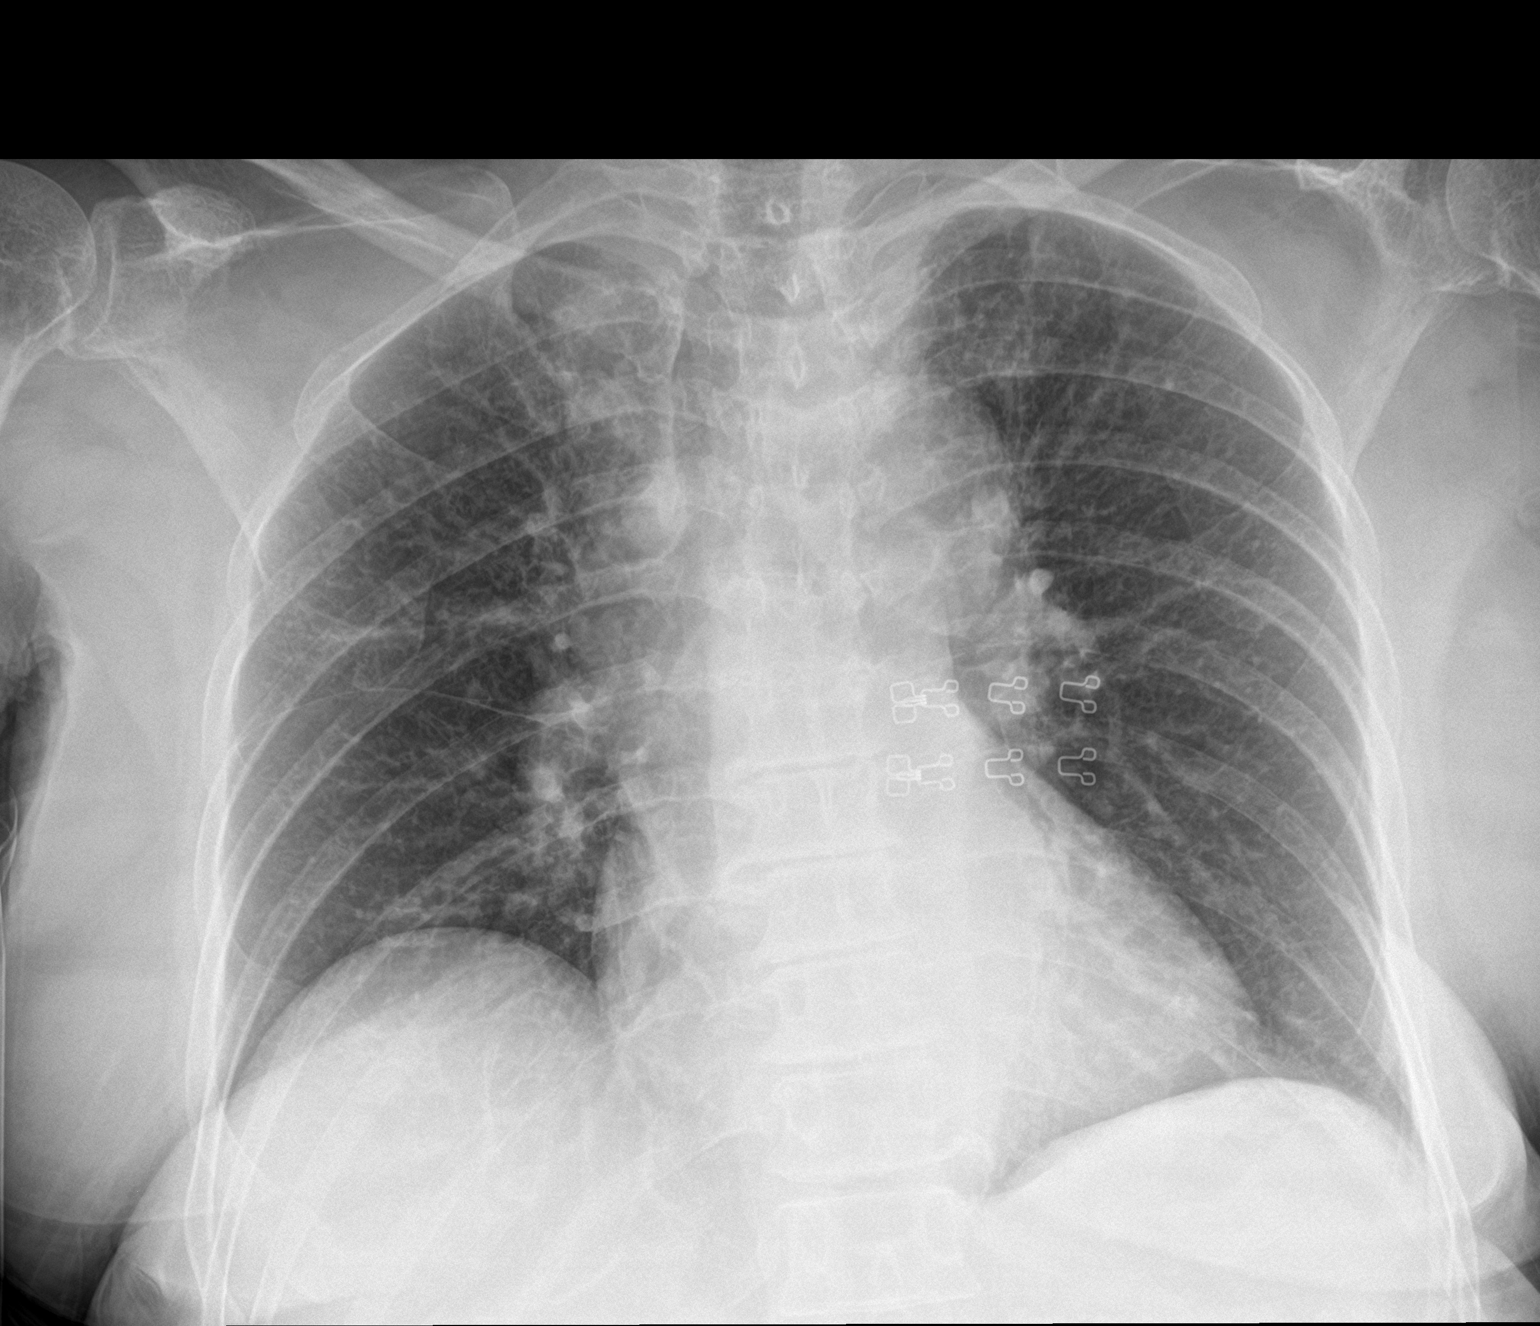

[1 of 1 positions shown; findings below may reference images not displayed]

FINDINGS: Prominence of the mediastinum is probably due to the portable
technique. A PA and lateral chest x-ray could further evaluate if
clinically warranted. The heart, hila, and pleura are normal. No
pneumothorax. No pulmonary nodules, masses, or focal infiltrates.
IMPRESSION: Mild prominence of mediastinum is likely due to portable technique.
No other abnormalities.

## 2019-05-10 ENCOUNTER — Ambulatory Visit (INDEPENDENT_AMBULATORY_CARE_PROVIDER_SITE_OTHER): Payer: Medicare HMO | Admitting: *Deleted

## 2019-05-10 DIAGNOSIS — I6389 Other cerebral infarction: Secondary | ICD-10-CM

## 2019-05-10 DIAGNOSIS — I639 Cerebral infarction, unspecified: Secondary | ICD-10-CM

## 2019-05-10 LAB — CUP PACEART REMOTE DEVICE CHECK
Date Time Interrogation Session: 20200820100755
Implantable Pulse Generator Implant Date: 20190307

## 2019-05-17 NOTE — Telephone Encounter (Signed)
Patient called back returning a call from our office. Please contact her on her husband's cell phone

## 2019-05-17 NOTE — Telephone Encounter (Signed)
Left message with husband asking pt to return call to our office.  He is agreeable to giving pt message as she is not home currently

## 2019-05-17 NOTE — Telephone Encounter (Signed)
Pt returns my call.  Informed of recommendation to start Lopressor.  Pt inquired to how many tachy episodes she has and was informed one episode, lasted 7 seconds w/ HR of 176. Pt would like to continue to monitor for more episodes before starting a medication.  Informed that I would forward back to Dr. Curt Bears for advisement and call her back if he disagrees w/ continued monitoring for now.  Pt understands I will only call her if he advises differently. Pt agreeable to plan.

## 2019-05-18 NOTE — Progress Notes (Signed)
Carelink Summary Report / Loop Recorder 

## 2019-06-12 ENCOUNTER — Ambulatory Visit (INDEPENDENT_AMBULATORY_CARE_PROVIDER_SITE_OTHER): Payer: Medicare HMO | Admitting: *Deleted

## 2019-06-12 DIAGNOSIS — I639 Cerebral infarction, unspecified: Secondary | ICD-10-CM

## 2019-06-12 LAB — CUP PACEART REMOTE DEVICE CHECK
Date Time Interrogation Session: 20200922100631
Implantable Pulse Generator Implant Date: 20190307

## 2019-06-19 ENCOUNTER — Ambulatory Visit: Payer: Medicare HMO | Admitting: Adult Health

## 2019-06-19 ENCOUNTER — Encounter: Payer: Self-pay | Admitting: Adult Health

## 2019-06-19 ENCOUNTER — Other Ambulatory Visit: Payer: Self-pay

## 2019-06-19 VITALS — BP 117/68 | HR 73 | Temp 97.2°F | Ht 61.5 in | Wt 138.2 lb

## 2019-06-19 DIAGNOSIS — I10 Essential (primary) hypertension: Secondary | ICD-10-CM | POA: Diagnosis not present

## 2019-06-19 DIAGNOSIS — I63412 Cerebral infarction due to embolism of left middle cerebral artery: Secondary | ICD-10-CM | POA: Diagnosis not present

## 2019-06-19 DIAGNOSIS — E785 Hyperlipidemia, unspecified: Secondary | ICD-10-CM

## 2019-06-19 NOTE — Patient Instructions (Signed)
Continue aspirin 81 mg daily  and mevacor  for secondary stroke prevention  Continue to follow up with PCP regarding cholesterol and blood pressure management   Continue to stay active and maintain a health diet  Continue to monitor blood pressure at home  Maintain strict control of hypertension with blood pressure goal below 130/90, diabetes with hemoglobin A1c goal below 6.5% and cholesterol with LDL cholesterol (bad cholesterol) goal below 70 mg/dL. I also advised the patient to eat a healthy diet with plenty of whole grains, cereals, fruits and vegetables, exercise regularly and maintain ideal body weight.         Thank you for coming to see [place] at [facility]. I hope we have been able to provide you high quality care today.  You may receive a patient satisfaction survey over the next few weeks. We would appreciate your feedback and comments so that we may continue to improve ourselves and the health of our patients.

## 2019-06-19 NOTE — Progress Notes (Signed)
[facility] [address] [place]. [place] [id number] [phone]       OFFICE FOLLOW UP NOTE  Christy Fletcher Date of Birth:  [birth year] Medical Record Number:  [id number]   Reason for Referral:  hospital stroke follow up 11/22/2017 CHIEF COMPLAINT:  Chief Complaint  Patient presents with  . Follow-up    6 mon f/u. Alone. Rm 9. No new concerns at this time.     HPI:  Update 06/10/2019: Christy Fletcher is being seen today for stroke follow-up.  She has been stable from a neurological standpoint without reoccurring or new stroke/TIA symptoms.  Continues on aspirin and lovastatin for secondary stroke prevention without side effects.  Blood pressure today 117/68.  Loop recorder has not shown atrial fibrillation thus far.  No further concerns at this time.   Update 09/25/2018: Patient is being seen today for follow-up for left frontal cortical infarct on 11/22/2017.  She continues to do well from a stroke standpoint without recurring symptoms or residual deficit.  She continues on aspirin without side effects of bleeding or bruising.  Continues on lovastatin without side effects of myalgias.  Blood pressure today 132/65.  Loop recorder has not shown atrial fibrillation thus far.  No further questions or concerns.  Denies new or worsening stroke/TIA symptoms.  Initial visit 03/20/2018:  Christy Fletcher is being seen today for initial visit in the office for left frontal infarct on 11/22/17. History obtained from patient and chart review. Reviewed all radiology images and labs personally.  [name] is a 78 y.o. female w/ hx of osteopenia, dyslipidemia and arthiritis admitted for hip pain following mechanical fall who post op developed a few minutes of garbled speech in hsoptial, which quickly resolved. She was not on tele at time of the event. She did not receive IV t-PA due to fast symptoms resolution. MRI shows an acute infarct.  CT head reviewed and was negative for acute  infarct.  CTA head and neck negative for emergent LVO with only mild atherosclerosis.  MRI head reviewed and showed left frontal cortical infarct in the language area.  Carotid Doppler showed bilateral ICA stenosis of 1 to 39% with VAC antegrade.  2D echo showed an EF of 60 to 65% without cardiac source of embolus.  TEE was performed to look for embolic source and did show possible source of  embolus (per notes) with atrial septal aneurysm with PFO.  Lower extremity venous Dopplers performed which was negative for DVT.  Loop recorder was placed on 11/24/2017.  A1c satisfactory at 5.6 and LDL satisfactory at 64.  Patient was not on antithrombotic prior to admission and recommended DAPT for 3 weeks and then Plavix alone.  Due to episode of epistaxis on 11/28/2017, Dr. Pearlean BrownieSethi recommended to stop Plavix and continue on aspirin alone.  Patient is being seen today for hospital follow-up and overall is doing well from a stroke standpoint.  He continues to take aspirin only without side effects of bleeding or bruising or additional episodes of epistaxis.  Continues to take lovastatin without side effects myalgias.  Blood pressure today satisfactory 127/70.  She is living at home independently with her husband and able to continue on all ADLs and IADLs.  Loop recorder has not shown atrial fibrillation thus far.  Patient denies new or worsening stroke/TIA symptoms.    ROS:   14 system review of systems performed and negative with exception of no complaints  PMH:  Past Medical History:  Diagnosis Date  . Arthritis   . Diarrhea   . Hypercholesteremia   . Osteopenia     PSH:  Past Surgical History:  Procedure Laterality Date  . ABDOMINAL HYSTERECTOMY    . CATARACT EXTRACTION    . COLONOSCOPY    . COLONOSCOPY N/A 05/14/2013   Procedure: COLONOSCOPY;  Surgeon: West Bali, MD;  [facility];  Service: Endoscopy;  Laterality: N/A;  10:30 AM-rescheduled to 8:45am Doris notified pt  . HERNIA  REPAIR    . HIP ARTHROPLASTY Right 11/20/2017   Procedure: RIGHT HIP HEMIARTHROPLASTY;  Surgeon: Eldred Manges, MD;  Location: [facility] OR;  Service: Orthopedics;  Laterality: Right;  . LOOP RECORDER INSERTION N/A 11/24/2017   Procedure: LOOP RECORDER INSERTION;  Surgeon: Regan Lemming, MD;  Location: [facility] INVASIVE CV LAB;  Service: Cardiovascular;  Laterality: N/A;  . TEE WITHOUT CARDIOVERSION N/A 11/24/2017   Procedure: TRANSESOPHAGEAL ECHOCARDIOGRAM (TEE) WITH LOOP;  Surgeon: Laurey Morale, MD;  Location: [facility] ENDOSCOPY;  Service: Cardiovascular;  Laterality: N/A;    Social History:  Social History   Socioeconomic History  . Marital status: Married    Spouse name: Not on file  . Number of children: Not on file  . Years of education: Not on file  . Highest education level: Not on file  Occupational History  . Not on file  Social Needs  . Financial resource strain: Not on file  . Food insecurity    Worry: Not on file    Inability: Not on file  . Transportation needs    Medical: Not on file    Non-medical: Not on file  Tobacco Use  . Smoking status: Never Smoker  . Smokeless tobacco: Never Used  Substance and Sexual Activity  . Alcohol use: No  . Drug use: No  . Sexual activity: Not on file  Lifestyle  . Physical activity    Days per week: Not on file    Minutes per session: Not on file  . Stress: Not on file  Relationships  . Social Musician on phone: Not on file    Gets together: Not on file    Attends religious service: Not on file    Active member of club or organization: Not on file    Attends meetings of clubs or organizations: Not on file    Relationship status: Not on file  . Intimate partner violence    Fear of current or ex partner: Not on file    Emotionally abused: Not on file    Physically abused: Not on file    Forced sexual activity: Not on file  Other Topics Concern  . Not on file  Social History Narrative  . Not on file    Family  History:  Family History  Problem Relation Age of Onset  . Colon cancer Neg Hx     Medications:   Current Outpatient Medications on File Prior to Visit  Medication Sig Dispense Refill  . aspirin EC 81 MG EC tablet Take 1 tablet (81 mg total) by mouth daily. 30 tablet 0  . calcium carbonate (OS-CAL) 600 MG TABS tablet Take 1,200 mg by mouth daily.    Marland Kitchen lovastatin (MEVACOR) 20 MG tablet Take 20 mg by mouth at bedtime.     No current facility-administered medications on file prior to visit.     Allergies:   Allergies  Allergen Reactions  . Plavix [Clopidogrel Bisulfate]      Physical  Exam  Vitals:   06/19/19 1527  BP: 117/68  Pulse: 73  Temp: (!) 97.2 F (36.2 C)  TempSrc: Oral  Weight: 138 lb 3.2 oz (62.7 kg)  Height: 5' 1.5" (1.562 m)   Body mass index is 25.69 kg/m. No exam data present  General: well developed, well nourished, pleasant elderly Caucasian female, seated, in no evident distress Head: head normocephalic and atraumatic.   Neck: supple with no carotid or supraclavicular bruits Cardiovascular: regular rate and rhythm, no murmurs Musculoskeletal: no deformity Skin:  no rash/petichiae Vascular:  Normal pulses all extremities  Neurologic Exam Mental Status: Awake and fully alert. Oriented to place and time. Recent and remote memory intact. Attention span, concentration and fund of knowledge appropriate. Mood and affect appropriate.  Cranial Nerves: Pupils equal, briskly reactive to light. Extraocular movements full without nystagmus. Visual fields full to confrontation. Hearing intact. Facial sensation intact. Face, tongue, palate moves normally and symmetrically.  Motor: Normal bulk and tone. Normal strength in all tested extremity muscles. Sensory.: intact to touch , pinprick , position and vibratory sensation.  Coordination: Rapid alternating movements normal in all extremities. Finger-to-nose and heel-to-shin performed accurately bilaterally. Gait and  Station: Arises from chair without difficulty. Stance is normal. Gait demonstrates normal stride length and balance . Able to heel, toe and tandem walk without difficulty.  Reflexes: 1+ and symmetric. Toes downgoing.       Diagnostic Data (Labs, Imaging, Testing)  CT HEAD WO CONTRAST 11/21/17 IMPRESSION: Normal noncontrast CT HEAD for age.  MR BRAIN WO CONTRAST 11/21/17 IMPRESSION: Small area of acute infarct left frontal cortex over the convexity. Possible embolus. Mild chronic microvascular ischemia in the white matter. Scattered areas of chronic microhemorrhage in the cerebral cortex bilaterally, probable hypertension related.  CT ANGIO HEAD W OR WO CONTRAST CT ANGIO NECK W OR WO CONTRAST 11/23/17 IMPRESSION: 1. No emergent finding. No flow limiting stenosis or embolic source identified in the head or neck. 2. Very mild atherosclerosis. 3. Known small left frontal cortex infarct that is not detectable by CT. No hemorrhage or visible progression.  ECHOCARDIOGRAM 11/22/17 Study Conclusions - Left ventricle: The cavity size was normal. Systolic function was   normal. The estimated ejection fraction was in the range of 60%   to 65%. Fletcher motion was normal; there were no regional Fletcher   motion abnormalities. Doppler parameters are consistent with   abnormal left ventricular relaxation (grade 1 diastolic   dysfunction). - Atrial septum: A patent foramen ovale cannot be excluded. There   was an atrial septal aneurysm. - Tricuspid valve: There was moderate regurgitation directed   centrally. - Pulmonary arteries: Systolic pressure was mildly increased. PA   peak pressure: 42 mm Hg (S). Recommendations:  Consider saline contrast study +/- transesophageal echocardiography if clinically indicated in order to exclude right to left atrial shunt.  ECHO TEE 11/24/17 Study Conclusions - Left ventricle: The cavity size was normal. Fletcher thickness was   increased in a pattern of mild  LVH. Systolic function was normal.   The estimated ejection fraction was in the range of 55% to 60%.   Fletcher motion was normal; there were no regional Fletcher motion   abnormalities. - Aortic valve: There was no stenosis. - Aorta: Normal caliber aorta with minimal plaque. - Mitral valve: There was trivial regurgitation. - Left atrium: The atrium was mildly dilated. No evidence of   thrombus in the atrial cavity or appendage. - Right ventricle: The cavity size was normal. Systolic function  was normal. - Right atrium: No evidence of thrombus in the atrial cavity or   appendage. - Atrial septum: There was an atrial septal aneurysm with PFO. The   PFO appeared to have bidirectional shunting. - Tricuspid valve: Trivial TR, peak RV-RA gradient 19 mmHg. Impressions: - Possible source of embolus = atrial septal aneurysm with PFO. To   get venous dopplers.  VAS [place] LOWER EXTREMITY VENOUS BILAT (DVT) 11/24/17 Final Interpretation: Right: There is no evidence of deep vein thrombosis in the lower extremity.There is no evidence of superficial venous thrombosis. No cystic structure found in the popliteal fossa. Left: There is no evidence of deep vein thrombosis in the lower extremity.There is no evidence of superficial venous thrombosis. A cystic structure is found in the popliteal fossa.      ASSESSMENT: Christy Fletcher is a 78 y.o. year old female here with left frontal infarct on 11/24/17 secondary to unknown source. Vascular risk factors include HLD and HTN.  Recovered well from a stroke standpoint without residual deficits.   PLAN: -Continue aspirin 81 mg daily  and lovastatin  for secondary stroke prevention -F/u with PCP regarding your HLD and HTN management  -continue to monitor loop recorder to r/o atrial fibrillation -Maintain strict control of hypertension with blood pressure goal below 130/90, diabetes with hemoglobin A1c goal below 6.5% and cholesterol with LDL cholesterol (bad  cholesterol) goal below 70 mg/dL. I also advised the patient to eat a healthy diet with plenty of whole grains, cereals, fruits and vegetables, exercise regularly and maintain ideal body weight.  Overall stable from stroke standpoint recommend follow-up as needed   Greater than 50% of time during this 25 minute visit was spent on counseling,explanation of diagnosis of left frontal infarct, reviewing risk factor management of HLD and HTN, planning of further management, discussion with patient and family and coordination of care    Ihor Austin, [facility]  [facility] [address] [place], [place] [address]  Phone [phone] Fax [phone]

## 2019-06-20 NOTE — Progress Notes (Signed)
I agree with the above plan 

## 2019-06-25 NOTE — Progress Notes (Signed)
Carelink Summary Report / Loop Recorder 

## 2019-07-15 LAB — CUP PACEART REMOTE DEVICE CHECK
Date Time Interrogation Session: 20201025100721
Implantable Pulse Generator Implant Date: 20190307

## 2019-07-16 ENCOUNTER — Ambulatory Visit (INDEPENDENT_AMBULATORY_CARE_PROVIDER_SITE_OTHER): Payer: Medicare HMO | Admitting: *Deleted

## 2019-07-16 DIAGNOSIS — I6310 Cerebral infarction due to embolism of unspecified precerebral artery: Secondary | ICD-10-CM | POA: Diagnosis not present

## 2019-07-26 ENCOUNTER — Other Ambulatory Visit (HOSPITAL_COMMUNITY): Payer: Self-pay | Admitting: Family Medicine

## 2019-07-26 DIAGNOSIS — Z1231 Encounter for screening mammogram for malignant neoplasm of breast: Secondary | ICD-10-CM

## 2019-08-03 ENCOUNTER — Ambulatory Visit (HOSPITAL_COMMUNITY)
Admission: RE | Admit: 2019-08-03 | Discharge: 2019-08-03 | Disposition: A | Discharge status: Home / Self Care | Payer: Medicare HMO | Source: Ambulatory Visit | Attending: Family Medicine | Admitting: Family Medicine

## 2019-08-03 ENCOUNTER — Other Ambulatory Visit: Payer: Self-pay

## 2019-08-03 DIAGNOSIS — Z1231 Encounter for screening mammogram for malignant neoplasm of breast: Secondary | ICD-10-CM | POA: Insufficient documentation

## 2019-08-03 NOTE — Progress Notes (Signed)
Carelink Summary Report / Loop Recorder 

## 2019-08-19 LAB — CUP PACEART REMOTE DEVICE CHECK
Date Time Interrogation Session: 20201127090157
Implantable Pulse Generator Implant Date: 20190307

## 2019-08-20 ENCOUNTER — Ambulatory Visit (INDEPENDENT_AMBULATORY_CARE_PROVIDER_SITE_OTHER): Payer: Medicare HMO | Admitting: *Deleted

## 2019-08-20 DIAGNOSIS — I6310 Cerebral infarction due to embolism of unspecified precerebral artery: Secondary | ICD-10-CM

## 2019-08-25 LAB — CUP PACEART REMOTE DEVICE CHECK
Date Time Interrogation Session: 20201204193330
Implantable Pulse Generator Implant Date: 20190307

## 2019-09-20 ENCOUNTER — Ambulatory Visit (INDEPENDENT_AMBULATORY_CARE_PROVIDER_SITE_OTHER): Payer: Medicare HMO | Admitting: *Deleted

## 2019-09-20 DIAGNOSIS — I6310 Cerebral infarction due to embolism of unspecified precerebral artery: Secondary | ICD-10-CM

## 2019-09-20 LAB — CUP PACEART REMOTE DEVICE CHECK
Date Time Interrogation Session: 20201230230725
Implantable Pulse Generator Implant Date: 20190307

## 2019-09-24 ENCOUNTER — Telehealth: Payer: Self-pay

## 2019-09-24 NOTE — Telephone Encounter (Signed)
I spoke with the pt to let her know she do not have to send a manual transmission. Her monitor automatically transmit nightly.

## 2019-10-22 ENCOUNTER — Ambulatory Visit (INDEPENDENT_AMBULATORY_CARE_PROVIDER_SITE_OTHER): Payer: Medicare HMO | Admitting: *Deleted

## 2019-10-22 DIAGNOSIS — I6310 Cerebral infarction due to embolism of unspecified precerebral artery: Secondary | ICD-10-CM | POA: Diagnosis not present

## 2019-10-22 LAB — CUP PACEART REMOTE DEVICE CHECK
Date Time Interrogation Session: 20210201000334
Implantable Pulse Generator Implant Date: 20190307

## 2019-10-23 NOTE — Progress Notes (Signed)
ILR Remote 

## 2019-11-22 ENCOUNTER — Ambulatory Visit (INDEPENDENT_AMBULATORY_CARE_PROVIDER_SITE_OTHER): Payer: Medicare HMO | Admitting: *Deleted

## 2019-11-22 DIAGNOSIS — I6310 Cerebral infarction due to embolism of unspecified precerebral artery: Secondary | ICD-10-CM | POA: Diagnosis not present

## 2019-11-22 LAB — CUP PACEART REMOTE DEVICE CHECK
Date Time Interrogation Session: 20210304025131
Implantable Pulse Generator Implant Date: 20190307

## 2019-11-22 NOTE — Progress Notes (Signed)
ILR Remote 

## 2019-12-24 ENCOUNTER — Ambulatory Visit (INDEPENDENT_AMBULATORY_CARE_PROVIDER_SITE_OTHER): Payer: Medicare HMO | Admitting: *Deleted

## 2019-12-24 DIAGNOSIS — I6310 Cerebral infarction due to embolism of unspecified precerebral artery: Secondary | ICD-10-CM

## 2019-12-24 LAB — CUP PACEART REMOTE DEVICE CHECK
Date Time Interrogation Session: 20210404035416
Implantable Pulse Generator Implant Date: 20190307

## 2019-12-26 NOTE — Progress Notes (Signed)
ILR Remote 

## 2020-01-24 LAB — CUP PACEART REMOTE DEVICE CHECK
Date Time Interrogation Session: 20210505035829
Implantable Pulse Generator Implant Date: 20190307

## 2020-01-28 ENCOUNTER — Ambulatory Visit (INDEPENDENT_AMBULATORY_CARE_PROVIDER_SITE_OTHER): Payer: Medicare HMO | Admitting: *Deleted

## 2020-01-28 DIAGNOSIS — I639 Cerebral infarction, unspecified: Secondary | ICD-10-CM | POA: Diagnosis not present

## 2020-01-28 NOTE — Progress Notes (Signed)
Carelink Summary Report / Loop Recorder 

## 2020-01-31 ENCOUNTER — Telehealth: Payer: Self-pay | Admitting: Adult Health

## 2020-01-31 NOTE — Telephone Encounter (Signed)
LMVM for pt that returned call.  ABX we donot make any determination re: this (did she mean aspirin?).  I may call her dentist if get chance.

## 2020-01-31 NOTE — Telephone Encounter (Signed)
Patient stopped by the lobby requesting a letter faxedt to her dentist that she does not need to take antibiotics prior to having dental cleaning. Patient's number is [phone].  Dental information is  Joni Fears a DDS  4.9 [phone])  Dentist  [address]  [phone] Fax [phone] attention Tacey Ruiz

## 2020-02-04 NOTE — Telephone Encounter (Signed)
I called dentist and spoke to them about pt needing ABX?? Prior to dental work.  She stated that pt had hip surgery in past and they needed information re: to that (her surgeon).  She will f/u on this with pt.

## 2020-02-06 ENCOUNTER — Telehealth: Payer: Self-pay | Admitting: Orthopaedic Surgery

## 2020-02-06 NOTE — Telephone Encounter (Signed)
Patient called asked if a note can be faxed over to her dentist office stating she do or do not have to take an antibiotic prior to being seen in the dentist office.  Patient has an appointment tomorrow at  Dr. Charlott Rakes office   In [place]   Fax # [phone]  Attn: Tacey Ruiz  Ph# is [phone]   The number to contact patient is [phone]

## 2020-02-07 NOTE — Telephone Encounter (Signed)
Note faxed.

## 2020-02-11 ENCOUNTER — Telehealth: Payer: Self-pay

## 2020-02-11 ENCOUNTER — Telehealth: Payer: Self-pay | Admitting: *Deleted

## 2020-02-11 NOTE — Telephone Encounter (Signed)
Pt called and wanted to let us know that she has that monitor plugged in and will transmit 03/03/2020 if it doesn't send automatically. Patient is aware to keep it plugged in and sleep near it

## 2020-02-11 NOTE — Telephone Encounter (Signed)
Transfer call to device clinic. Patient is calling about resetting transmitter

## 2020-02-11 NOTE — Telephone Encounter (Signed)
Spoke with family to inform of patient's disconnected monitor.

## 2020-02-26 ENCOUNTER — Telehealth: Payer: Self-pay | Admitting: Orthopaedic Surgery

## 2020-02-26 NOTE — Telephone Encounter (Signed)
Patient called. She has a dentist appointment on 6/15. Would like to know if the RX could be faxed to her dentist office. Fax # [phone]

## 2020-02-26 NOTE — Telephone Encounter (Signed)
Note was faxed from [facility] office on 02/07/2020 that patient does not require pre med.  I left voicemail for patient asking for return call if dentist did not receive the note and I can enter into system and fax again.

## 2020-02-27 NOTE — Telephone Encounter (Signed)
Patient called to back to let you that the dentist office has not received the note.  Please refax it to [phone].  Thank you.

## 2020-02-28 NOTE — Telephone Encounter (Signed)
Note faxed.

## 2020-03-03 ENCOUNTER — Ambulatory Visit (INDEPENDENT_AMBULATORY_CARE_PROVIDER_SITE_OTHER): Payer: Medicare HMO | Admitting: *Deleted

## 2020-03-03 ENCOUNTER — Telehealth: Payer: Self-pay

## 2020-03-03 DIAGNOSIS — I639 Cerebral infarction, unspecified: Secondary | ICD-10-CM

## 2020-03-03 LAB — CUP PACEART REMOTE DEVICE CHECK
Date Time Interrogation Session: 20210613231642
Implantable Pulse Generator Implant Date: 20190307

## 2020-03-03 NOTE — Telephone Encounter (Signed)
Faxed order for Premeds not needed to Dr Tenny Craw office. Refax it to fax # [phone].

## 2020-03-04 NOTE — Progress Notes (Signed)
Carelink Summary Report / Loop Recorder 

## 2020-04-07 ENCOUNTER — Ambulatory Visit (INDEPENDENT_AMBULATORY_CARE_PROVIDER_SITE_OTHER): Payer: Medicare HMO | Admitting: *Deleted

## 2020-04-07 DIAGNOSIS — I639 Cerebral infarction, unspecified: Secondary | ICD-10-CM

## 2020-04-07 LAB — CUP PACEART REMOTE DEVICE CHECK
Date Time Interrogation Session: 20210718232302
Implantable Pulse Generator Implant Date: 20190307

## 2020-04-08 NOTE — Progress Notes (Signed)
Carelink Summary Report / Loop Recorder 

## 2020-05-10 LAB — CUP PACEART REMOTE DEVICE CHECK
Date Time Interrogation Session: 20210819001409
Implantable Pulse Generator Implant Date: 20190307

## 2020-05-12 ENCOUNTER — Ambulatory Visit (INDEPENDENT_AMBULATORY_CARE_PROVIDER_SITE_OTHER): Payer: Medicare HMO | Admitting: *Deleted

## 2020-05-12 DIAGNOSIS — I6310 Cerebral infarction due to embolism of unspecified precerebral artery: Secondary | ICD-10-CM | POA: Diagnosis not present

## 2020-05-19 NOTE — Progress Notes (Signed)
Carelink Summary Report / Loop Recorder 

## 2020-06-15 LAB — CUP PACEART REMOTE DEVICE CHECK
Date Time Interrogation Session: 20210919002956
Implantable Pulse Generator Implant Date: 20190307

## 2020-06-16 ENCOUNTER — Ambulatory Visit (INDEPENDENT_AMBULATORY_CARE_PROVIDER_SITE_OTHER): Payer: Medicare HMO | Admitting: Emergency Medicine

## 2020-06-16 DIAGNOSIS — I6310 Cerebral infarction due to embolism of unspecified precerebral artery: Secondary | ICD-10-CM | POA: Diagnosis not present

## 2020-06-19 NOTE — Progress Notes (Signed)
Carelink Summary Report / Loop Recorder 

## 2020-07-09 ENCOUNTER — Ambulatory Visit (INDEPENDENT_AMBULATORY_CARE_PROVIDER_SITE_OTHER): Payer: Medicare HMO

## 2020-07-09 DIAGNOSIS — I639 Cerebral infarction, unspecified: Secondary | ICD-10-CM | POA: Diagnosis not present

## 2020-07-09 LAB — CUP PACEART REMOTE DEVICE CHECK
Date Time Interrogation Session: 20211020021332
Implantable Pulse Generator Implant Date: 20190307

## 2020-07-15 NOTE — Progress Notes (Signed)
Carelink Summary Report / Loop Recorder 

## 2020-08-11 ENCOUNTER — Ambulatory Visit (INDEPENDENT_AMBULATORY_CARE_PROVIDER_SITE_OTHER): Payer: Medicare HMO

## 2020-08-11 DIAGNOSIS — I639 Cerebral infarction, unspecified: Secondary | ICD-10-CM

## 2020-08-12 LAB — CUP PACEART REMOTE DEVICE CHECK
Date Time Interrogation Session: 20211120013229
Implantable Pulse Generator Implant Date: 20190307

## 2020-08-12 NOTE — Progress Notes (Signed)
Carelink Summary Report / Loop Recorder 

## 2020-09-08 ENCOUNTER — Other Ambulatory Visit (HOSPITAL_COMMUNITY): Payer: Self-pay | Admitting: Family Medicine

## 2020-09-08 DIAGNOSIS — Z1231 Encounter for screening mammogram for malignant neoplasm of breast: Secondary | ICD-10-CM

## 2020-09-15 ENCOUNTER — Ambulatory Visit (INDEPENDENT_AMBULATORY_CARE_PROVIDER_SITE_OTHER): Payer: Medicare HMO

## 2020-09-15 DIAGNOSIS — I639 Cerebral infarction, unspecified: Secondary | ICD-10-CM

## 2020-09-16 LAB — CUP PACEART REMOTE DEVICE CHECK
Date Time Interrogation Session: 20211221013301
Implantable Pulse Generator Implant Date: 20190307

## 2020-09-17 ENCOUNTER — Other Ambulatory Visit: Payer: Self-pay

## 2020-09-17 ENCOUNTER — Ambulatory Visit (HOSPITAL_COMMUNITY)
Admission: RE | Admit: 2020-09-17 | Discharge: 2020-09-17 | Disposition: A | Discharge status: Home / Self Care | Payer: Medicare HMO | Source: Ambulatory Visit | Attending: Family Medicine | Admitting: Family Medicine

## 2020-09-17 DIAGNOSIS — Z1231 Encounter for screening mammogram for malignant neoplasm of breast: Secondary | ICD-10-CM | POA: Diagnosis present

## 2020-09-25 NOTE — Progress Notes (Signed)
Carelink Summary Report / Loop Recorder 

## 2020-10-20 ENCOUNTER — Ambulatory Visit (INDEPENDENT_AMBULATORY_CARE_PROVIDER_SITE_OTHER): Payer: Medicare HMO

## 2020-10-20 DIAGNOSIS — I639 Cerebral infarction, unspecified: Secondary | ICD-10-CM | POA: Diagnosis not present

## 2020-10-20 LAB — CUP PACEART REMOTE DEVICE CHECK
Date Time Interrogation Session: 20220129231124
Implantable Pulse Generator Implant Date: 20190307

## 2020-10-29 NOTE — Progress Notes (Signed)
Carelink Summary Report / Loop Recorder 

## 2020-11-03 DIAGNOSIS — H02403 Unspecified ptosis of bilateral eyelids: Secondary | ICD-10-CM | POA: Diagnosis not present

## 2020-11-03 DIAGNOSIS — H43811 Vitreous degeneration, right eye: Secondary | ICD-10-CM | POA: Diagnosis not present

## 2020-11-03 DIAGNOSIS — H31091 Other chorioretinal scars, right eye: Secondary | ICD-10-CM | POA: Diagnosis not present

## 2020-11-03 DIAGNOSIS — Z961 Presence of intraocular lens: Secondary | ICD-10-CM | POA: Diagnosis not present

## 2020-11-04 ENCOUNTER — Telehealth (HOSPITAL_COMMUNITY): Payer: Self-pay | Admitting: Family Medicine

## 2020-11-04 ENCOUNTER — Ambulatory Visit: Payer: Medicare HMO

## 2020-11-04 ENCOUNTER — Ambulatory Visit
Admission: RE | Admit: 2020-11-04 | Discharge: 2020-11-04 | Disposition: A | Discharge status: Home / Self Care | Payer: Medicare HMO | Source: Ambulatory Visit | Attending: Family Medicine | Admitting: Family Medicine

## 2020-11-04 ENCOUNTER — Other Ambulatory Visit: Payer: Self-pay

## 2020-11-04 ENCOUNTER — Ambulatory Visit (HOSPITAL_COMMUNITY): Payer: Medicare HMO

## 2020-11-04 VITALS — BP 138/81 | HR 73 | Temp 97.7°F | Resp 18

## 2020-11-04 DIAGNOSIS — S40021A Contusion of right upper arm, initial encounter: Secondary | ICD-10-CM | POA: Diagnosis not present

## 2020-11-04 DIAGNOSIS — L03113 Cellulitis of right upper limb: Secondary | ICD-10-CM | POA: Diagnosis not present

## 2020-11-04 DIAGNOSIS — M79601 Pain in right arm: Secondary | ICD-10-CM | POA: Insufficient documentation

## 2020-11-04 DIAGNOSIS — S40011A Contusion of right shoulder, initial encounter: Secondary | ICD-10-CM | POA: Diagnosis not present

## 2020-11-04 DIAGNOSIS — R58 Hemorrhage, not elsewhere classified: Secondary | ICD-10-CM | POA: Diagnosis not present

## 2020-11-04 MED ORDER — CEPHALEXIN 500 MG PO CAPS: 500.0000 mg | ORAL_CAPSULE | Freq: Two times a day (BID) | ORAL | 0 refills | Status: AC

## 2020-11-04 NOTE — Discharge Instructions (Addendum)
I will call you with your xray results. We will make a plan from there.

## 2020-11-04 NOTE — ED Triage Notes (Signed)
Noticed a red spot on her arm then the spot had a white head on it.  Today arm is bruised with a raised spot in the center of the bruise.

## 2020-11-04 NOTE — Telephone Encounter (Signed)
Spoke with Christy Fletcher about xray results.  Xray was negative, will treat for cellulitis.   Sent in Kelfex 500mg  BID x 7 days.   Follow up with this office or with primary care if symptoms are persisting.  Follow up in the ER for high fever, trouble swallowing, trouble breathing, other concerning symptoms.

## 2020-11-07 NOTE — ED Provider Notes (Signed)
[facility] CARE [facility]ENTER   [id number] 11/04/20 Arrival Time: 1332  VV:OHYWV PAIN  SUBJECTIVE: History from: patient. Christy Fletcher is a 80 y.o. female complains of right upper arm pain and bruising since yesterday. Reports that there appeared to be a bump on the R upper arm yesterday with some redness. States that the bruising was present this morning when she woke up. Denies a precipitating event or specific injury. Describes the pain as intermittent and achy in character.  Has not attempted OTC treatment. Symptoms are made worse with activity.  Denies similar symptoms in the past. Denies fever, chills, erythema, effusion, weakness, numbness and tingling, saddle paresthesias, loss of bowel or bladder function.      ROS: As per HPI.  All other pertinent ROS negative.     Past Medical History:  Diagnosis Date  . Arthritis   . Diarrhea   . Hypercholesteremia   . Osteopenia    Past Surgical History:  Procedure Laterality Date  . ABDOMINAL HYSTERECTOMY    . CATARACT EXTRACTION    . COLONOSCOPY    . COLONOSCOPY N/A 05/14/2013   Procedure: COLONOSCOPY;  Surgeon: West Bali, MD;  [facility]: [facility];  Service: Endoscopy;  Laterality: N/A;  10:30 AM-rescheduled to 8:45am Doris notified pt  . HERNIA REPAIR    . HIP ARTHROPLASTY Right 11/20/2017   Procedure: RIGHT HIP HEMIARTHROPLASTY;  Surgeon: Eldred Manges, MD;  Location[facility] OR;  Service: Orthopedics;  Laterality: Right;  . LOOP RECORDER INSERTION N/A 11/24/2017   Procedure: LOOP RECORDER INSERTION;  Surgeon: Regan Lemming, MD;  Location: [facility] INVASIVE CV LAB;  Service: Cardiovascular;  Laterality: N/A;  . TEE WITHOUT CARDIOVERSION N/A 11/24/2017   Procedure: TRANSESOPHAGEAL ECHOCARDIOGRAM (TEE) WITH LOOP;  Surgeon: Laurey Morale, MD;  Location: [facility] ENDOSCOPY;  Service: Cardiovascular;  Laterality: N/A;   Allergies  Allergen Reactions  . Plavix [Clopidogrel Bisulfate]    No current facility-administered medications on file  prior to encounter.   Current Outpatient Medications on File Prior to Encounter  Medication Sig Dispense Refill  . aspirin EC 81 MG EC tablet Take 1 tablet (81 mg total) by mouth daily. 30 tablet 0  . calcium carbonate (OS-CAL) 600 MG TABS tablet Take 1,200 mg by mouth daily.    Marland Kitchen lovastatin (MEVACOR) 20 MG tablet Take 20 mg by mouth at bedtime.     Social History   Socioeconomic History  . Marital status: Married    Spouse name: Not on file  . Number of children: Not on file  . Years of education: Not on file  . Highest education level: Not on file  Occupational History  . Not on file  Tobacco Use  . Smoking status: Never Smoker  . Smokeless tobacco: Never Used  Substance and Sexual Activity  . Alcohol use: No  . Drug use: No  . Sexual activity: Not on file  Other Topics Concern  . Not on file  Social History Narrative  . Not on file   Social Determinants of Health   Financial Resource Strain: Not on file  Food Insecurity: Not on file  Transportation Needs: Not on file  Physical Activity: Not on file  Stress: Not on file  Social Connections: Not on file  Intimate Partner Violence: Not on file   Family History  Problem Relation Age of Onset  . Colon cancer Neg Hx     OBJECTIVE:  Vitals:   11/04/20 1342  BP: 138/81  Pulse: 73  Resp: 18  Temp: 97.7 F (36.5 C)  TempSrc: Oral  SpO2: 95%    General appearance: ALERT; in no acute distress.  Head: NCAT Lungs: Normal respiratory effort CV: pulses 2+ bilaterally. Cap refill < 2 seconds Musculoskeletal:  Inspection: Skin warm, dry, clear and intact Ecchymosis to R upper arm No erythema, effusion  Palpation: Non tender to palpation ROM: Limited ROM active and passive to R shoulder Skin: warm and dry Neurologic: Ambulates without difficulty; Sensation intact about the upper/ lower extremities Psychological: alert and cooperative; normal mood and affect  DIAGNOSTIC STUDIES:  No results found.    ASSESSMENT & PLAN:  1. Cellulitis of right upper extremity   2. Ecchymosis   3. Right arm pain    Waiting on outpatient xray If negative, will treat for cellulitis Continue conservative management of rest, ice, and gentle stretches Follow up with PCP if symptoms persist Return or go to the ER if you have any new or worsening symptoms (fever, chills, chest pain, abdominal pain, changes in bowel or bladder habits, pain radiating into lower legs)   Reviewed expectations re: course of current medical issues. Questions answered. Outlined signs and symptoms indicating need for more acute intervention. Patient verbalized understanding. After Visit Summary given.       Moshe Cipro, NP 11/07/20 [phone]

## 2020-11-24 ENCOUNTER — Ambulatory Visit (INDEPENDENT_AMBULATORY_CARE_PROVIDER_SITE_OTHER): Payer: Medicare HMO

## 2020-11-24 DIAGNOSIS — I639 Cerebral infarction, unspecified: Secondary | ICD-10-CM | POA: Diagnosis not present

## 2020-11-25 LAB — CUP PACEART REMOTE DEVICE CHECK
Date Time Interrogation Session: 20220301231754
Implantable Pulse Generator Implant Date: 20190307

## 2020-12-02 NOTE — Progress Notes (Signed)
Carelink Summary Report / Loop Recorder 

## 2020-12-16 ENCOUNTER — Ambulatory Visit
Admission: EM | Admit: 2020-12-16 | Discharge: 2020-12-16 | Disposition: A | Discharge status: Home / Self Care | Payer: Medicare HMO | Attending: Emergency Medicine | Admitting: Emergency Medicine

## 2020-12-16 ENCOUNTER — Encounter: Payer: Self-pay | Admitting: Emergency Medicine

## 2020-12-16 ENCOUNTER — Ambulatory Visit: Payer: Self-pay

## 2020-12-16 DIAGNOSIS — M25561 Pain in right knee: Secondary | ICD-10-CM | POA: Diagnosis not present

## 2020-12-16 MED ORDER — DEXAMETHASONE SODIUM PHOSPHATE 10 MG/ML IJ SOLN
10.0000 mg | Freq: Once | INTRAMUSCULAR | Status: AC
  Administered 2020-12-16: 10 mg via INTRAMUSCULAR

## 2020-12-16 MED ORDER — PREDNISONE 20 MG PO TABS: 20.0000 mg | ORAL_TABLET | Freq: Two times a day (BID) | ORAL | 0 refills | Status: AC

## 2020-12-16 NOTE — Discharge Instructions (Signed)
Steroid shot given in office Continue conservative management of rest, ice, and elevation Prednisone prescribed.  Take as directed and to completion Follow up with orthopedist for further evaluation and management Return or go to the ER if you have any new or worsening symptoms (fever, chills, chest pain, redness, swelling, bruising, etc...)

## 2020-12-16 NOTE — ED Provider Notes (Signed)
[facility]   [id number] 12/16/20 Arrival Time: 1733  CC: RT knee PAIN  SUBJECTIVE: History from: patient. Christy Fletcher is a 80 y.o. female complains of RT knee pain x 2-3 weeks.  Denies a precipitating event or specific injury.  Pain diffuse about the knee.  Describes the pain as intermittent.  Has tried OTC medications with minimal relief.  Symptoms are made worse with getting in and out of the car.  Worse at the end of the day.  Denies similar symptoms in the past.  Denies fever, chills, erythema, ecchymosis, effusion, weakness, numbness and tingling.  ROS: As per HPI.  All other pertinent ROS negative.     Past Medical History:  Diagnosis Date  . Arthritis   . Diarrhea   . Hypercholesteremia   . Osteopenia    Past Surgical History:  Procedure Laterality Date  . ABDOMINAL HYSTERECTOMY    . CATARACT EXTRACTION    . COLONOSCOPY    . COLONOSCOPY N/A 05/14/2013   Procedure: COLONOSCOPY;  Surgeon: West Bali, MD;  Location: [facility];  Service: Endoscopy;  Laterality: N/A;  10:30 AM-rescheduled to 8:45am Doris notified pt  . HERNIA REPAIR    . HIP ARTHROPLASTY Right 11/20/2017   Procedure: RIGHT HIP HEMIARTHROPLASTY;  Surgeon: Eldred Manges, MD;  Location: [facility] OR;  Service: Orthopedics;  Laterality: Right;  . LOOP RECORDER INSERTION N/A 11/24/2017   Procedure: LOOP RECORDER INSERTION;  Surgeon: Regan Lemming, MD;  Location: [facility] INVASIVE CV LAB;  Service: Cardiovascular;  Laterality: N/A;  . TEE WITHOUT CARDIOVERSION N/A 11/24/2017   Procedure: TRANSESOPHAGEAL ECHOCARDIOGRAM (TEE) WITH LOOP;  Surgeon: Laurey Morale, MD;  Location: [facility] ENDOSCOPY;  Service: Cardiovascular;  Laterality: N/A;   Allergies  Allergen Reactions  . Plavix [Clopidogrel Bisulfate]    No current facility-administered medications on file prior to encounter.   Current Outpatient Medications on File Prior to Encounter  Medication Sig Dispense Refill  . aspirin EC 81 MG EC tablet Take 1  tablet (81 mg total) by mouth daily. 30 tablet 0  . calcium carbonate (OS-CAL) 600 MG TABS tablet Take 1,200 mg by mouth daily.    Marland Kitchen lovastatin (MEVACOR) 20 MG tablet Take 20 mg by mouth at bedtime.     Social History   Socioeconomic History  . Marital status: Married    Spouse name: Not on file  . Number of children: Not on file  . Years of education: Not on file  . Highest education level: Not on file  Occupational History  . Not on file  Tobacco Use  . Smoking status: Never Smoker  . Smokeless tobacco: Never Used  Substance and Sexual Activity  . Alcohol use: No  . Drug use: No  . Sexual activity: Not on file  Other Topics Concern  . Not on file  Social History Narrative  . Not on file   Social Determinants of Health   Financial Resource Strain: Not on file  Food Insecurity: Not on file  Transportation Needs: Not on file  Physical Activity: Not on file  Stress: Not on file  Social Connections: Not on file  Intimate Partner Violence: Not on file   Family History  Problem Relation Age of Onset  . Colon cancer Neg Hx     OBJECTIVE:  Vitals:   12/16/20 1822  BP: 109/69  Pulse: 84  Resp: 18  Temp: 98.8 F (37.1 C)  TempSrc: Oral  SpO2: 95%    General appearance: ALERT;  in no acute distress.  Head: NCAT Lungs: Normal respiratory effort Musculoskeletal: RT knee Inspection: Skin warm, dry, clear and intact without obvious erythema, effusion, or ecchymosis.  Palpation: Diffusely TTP over anterior knee ROM: FROM active and passive Strength: strength intact Skin: warm and dry Neurologic: Ambulates without difficulty; Sensation intact about the lower extremities Psychological: alert and cooperative; normal mood and affect  ASSESSMENT & PLAN:  1. Acute pain of right knee    Meds ordered this encounter  Medications  . predniSONE (DELTASONE) 20 MG tablet    Sig: Take 1 tablet (20 mg total) by mouth 2 (two) times daily with a meal for 5 days.    Dispense:   10 tablet    Refill:  0    Order Specific Question:   Supervising Provider    Answer:   Eustace Moore [[id number]]  . dexamethasone (DECADRON) injection 10 mg   Steroid shot given in office Continue conservative management of rest, ice, and elevation Prednisone prescribed.  Take as directed and to completion Follow up with orthopedist for further evaluation and management Return or go to the ER if you have any new or worsening symptoms (fever, chills, chest pain, redness, swelling, bruising, etc...)   Reviewed expectations re: course of current medical issues. Questions answered. Outlined signs and symptoms indicating need for more acute intervention. Patient verbalized understanding. After Visit Summary given.    Rennis Harding, PA-C 12/16/20 [phone]

## 2020-12-16 NOTE — ED Triage Notes (Signed)
Right leg pain pain down to ankle, hx of right hip surgery 3 years ago.  Pain x 2 to 3 weeks

## 2020-12-22 ENCOUNTER — Other Ambulatory Visit (HOSPITAL_COMMUNITY): Payer: Self-pay | Admitting: Family Medicine

## 2020-12-22 DIAGNOSIS — M25561 Pain in right knee: Secondary | ICD-10-CM

## 2020-12-23 ENCOUNTER — Other Ambulatory Visit: Payer: Self-pay

## 2020-12-23 ENCOUNTER — Other Ambulatory Visit (HOSPITAL_COMMUNITY): Payer: Self-pay | Admitting: Family Medicine

## 2020-12-23 ENCOUNTER — Ambulatory Visit (HOSPITAL_COMMUNITY)
Admission: RE | Admit: 2020-12-23 | Discharge: 2020-12-23 | Disposition: A | Discharge status: Home / Self Care | Payer: Medicare HMO | Source: Ambulatory Visit | Attending: Family Medicine | Admitting: Family Medicine

## 2020-12-23 DIAGNOSIS — M79604 Pain in right leg: Secondary | ICD-10-CM | POA: Insufficient documentation

## 2020-12-23 DIAGNOSIS — M25561 Pain in right knee: Secondary | ICD-10-CM | POA: Diagnosis not present

## 2020-12-23 DIAGNOSIS — M25551 Pain in right hip: Secondary | ICD-10-CM

## 2020-12-23 DIAGNOSIS — M1711 Unilateral primary osteoarthritis, right knee: Secondary | ICD-10-CM | POA: Diagnosis not present

## 2020-12-27 LAB — CUP PACEART REMOTE DEVICE CHECK
Date Time Interrogation Session: 20220402001935
Implantable Pulse Generator Implant Date: 20190307

## 2020-12-29 ENCOUNTER — Ambulatory Visit (INDEPENDENT_AMBULATORY_CARE_PROVIDER_SITE_OTHER): Payer: Medicare HMO

## 2020-12-29 DIAGNOSIS — I639 Cerebral infarction, unspecified: Secondary | ICD-10-CM | POA: Diagnosis not present

## 2021-01-02 DIAGNOSIS — M1711 Unilateral primary osteoarthritis, right knee: Secondary | ICD-10-CM | POA: Diagnosis not present

## 2021-01-12 NOTE — Progress Notes (Signed)
Carelink Summary Report / Loop Recorder 

## 2021-02-02 ENCOUNTER — Ambulatory Visit (INDEPENDENT_AMBULATORY_CARE_PROVIDER_SITE_OTHER): Payer: Medicare HMO

## 2021-02-02 DIAGNOSIS — I639 Cerebral infarction, unspecified: Secondary | ICD-10-CM | POA: Diagnosis not present

## 2021-02-04 LAB — CUP PACEART REMOTE DEVICE CHECK
Date Time Interrogation Session: 20220514231103
Implantable Pulse Generator Implant Date: 20190307

## 2021-02-24 NOTE — Progress Notes (Signed)
Carelink Summary Report / Loop Recorder 

## 2021-03-09 ENCOUNTER — Ambulatory Visit (INDEPENDENT_AMBULATORY_CARE_PROVIDER_SITE_OTHER): Payer: Medicare HMO

## 2021-03-09 DIAGNOSIS — I639 Cerebral infarction, unspecified: Secondary | ICD-10-CM | POA: Diagnosis not present

## 2021-03-09 LAB — CUP PACEART REMOTE DEVICE CHECK
Date Time Interrogation Session: 20220614232855
Implantable Pulse Generator Implant Date: 20190307

## 2021-03-27 NOTE — Progress Notes (Signed)
Carelink Summary Report / Loop Recorder 

## 2021-04-09 LAB — CUP PACEART REMOTE DEVICE CHECK
Date Time Interrogation Session: 20220716002652
Implantable Pulse Generator Implant Date: 20190307

## 2021-04-13 ENCOUNTER — Ambulatory Visit (INDEPENDENT_AMBULATORY_CARE_PROVIDER_SITE_OTHER): Payer: Medicare HMO

## 2021-04-13 DIAGNOSIS — I639 Cerebral infarction, unspecified: Secondary | ICD-10-CM

## 2021-05-05 ENCOUNTER — Ambulatory Visit (INDEPENDENT_AMBULATORY_CARE_PROVIDER_SITE_OTHER): Payer: Medicare HMO

## 2021-05-05 DIAGNOSIS — I639 Cerebral infarction, unspecified: Secondary | ICD-10-CM | POA: Diagnosis not present

## 2021-05-05 LAB — CUP PACEART REMOTE DEVICE CHECK
Date Time Interrogation Session: 20220816011452
Implantable Pulse Generator Implant Date: 20190307

## 2021-05-05 NOTE — Progress Notes (Signed)
Carelink Summary Report / Loop Recorder 

## 2021-05-26 NOTE — Progress Notes (Signed)
Carelink Summary Report / Loop Recorder 

## 2021-05-27 ENCOUNTER — Encounter (HOSPITAL_COMMUNITY): Payer: Self-pay

## 2021-05-27 ENCOUNTER — Emergency Department (HOSPITAL_COMMUNITY)
Admission: EM | Admit: 2021-05-27 | Discharge: 2021-05-27 | Disposition: A | Discharge status: Home / Self Care | Payer: Medicare HMO | Attending: Emergency Medicine | Admitting: Emergency Medicine

## 2021-05-27 ENCOUNTER — Other Ambulatory Visit: Payer: Self-pay

## 2021-05-27 ENCOUNTER — Emergency Department (HOSPITAL_COMMUNITY): Payer: Medicare HMO

## 2021-05-27 ENCOUNTER — Ambulatory Visit: Payer: Medicare HMO

## 2021-05-27 DIAGNOSIS — W19XXXA Unspecified fall, initial encounter: Secondary | ICD-10-CM

## 2021-05-27 DIAGNOSIS — R42 Dizziness and giddiness: Secondary | ICD-10-CM | POA: Diagnosis not present

## 2021-05-27 DIAGNOSIS — Z7982 Long term (current) use of aspirin: Secondary | ICD-10-CM | POA: Insufficient documentation

## 2021-05-27 DIAGNOSIS — S0591XA Unspecified injury of right eye and orbit, initial encounter: Secondary | ICD-10-CM | POA: Diagnosis present

## 2021-05-27 DIAGNOSIS — Z96641 Presence of right artificial hip joint: Secondary | ICD-10-CM | POA: Diagnosis not present

## 2021-05-27 DIAGNOSIS — Y9229 Other specified public building as the place of occurrence of the external cause: Secondary | ICD-10-CM | POA: Insufficient documentation

## 2021-05-27 DIAGNOSIS — Z23 Encounter for immunization: Secondary | ICD-10-CM | POA: Insufficient documentation

## 2021-05-27 DIAGNOSIS — W01198A Fall on same level from slipping, tripping and stumbling with subsequent striking against other object, initial encounter: Secondary | ICD-10-CM | POA: Diagnosis not present

## 2021-05-27 DIAGNOSIS — Y9301 Activity, walking, marching and hiking: Secondary | ICD-10-CM | POA: Insufficient documentation

## 2021-05-27 DIAGNOSIS — S01111A Laceration without foreign body of right eyelid and periocular area, initial encounter: Secondary | ICD-10-CM | POA: Diagnosis not present

## 2021-05-27 LAB — CBC
HCT: 39.5 % (ref 36.0–46.0)
Hemoglobin: 12.6 g/dL (ref 12.0–15.0)
MCH: 29.2 pg (ref 26.0–34.0)
MCHC: 31.9 g/dL (ref 30.0–36.0)
MCV: 91.4 fL (ref 80.0–100.0)
Platelets: 226 10*3/uL (ref 150–400)
RBC: 4.32 MIL/uL (ref 3.87–5.11)
RDW: 14 % (ref 11.5–15.5)
WBC: 4.5 10*3/uL (ref 4.0–10.5)
nRBC: 0 % (ref 0.0–0.2)

## 2021-05-27 LAB — BASIC METABOLIC PANEL
Anion gap: 10 (ref 5–15)
BUN: 10 mg/dL (ref 8–23)
CO2: 25 mmol/L (ref 22–32)
Calcium: 8.8 mg/dL — ABNORMAL LOW (ref 8.9–10.3)
Chloride: 105 mmol/L (ref 98–111)
Creatinine, Ser: 0.75 mg/dL (ref 0.44–1.00)
GFR, Estimated: >60 mL/min (ref >60)
Glucose, Bld: 105 mg/dL — ABNORMAL HIGH (ref 70–99)
Potassium: 3.2 mmol/L — ABNORMAL LOW (ref 3.5–5.1)
Sodium: 140 mmol/L (ref 135–145)

## 2021-05-27 MED ORDER — TETANUS-DIPHTH-ACELL PERTUSSIS 5-2.5-18.5 LF-MCG/0.5 IM SUSY
0.5000 mL | PREFILLED_SYRINGE | Freq: Once | INTRAMUSCULAR | Status: AC
  Administered 2021-05-27: 0.5 mL via INTRAMUSCULAR
  Filled 2021-05-27: qty 0.5

## 2021-05-27 MED ORDER — POTASSIUM CHLORIDE CRYS ER 20 MEQ PO TBCR
20.0000 meq | EXTENDED_RELEASE_TABLET | Freq: Once | ORAL | Status: AC
  Administered 2021-05-27: 20 meq via ORAL
  Filled 2021-05-27: qty 1

## 2021-05-27 NOTE — ED Provider Notes (Signed)
[facility] EMERGENCY DEPARTMENT Provider Note   CSN: [id number] Arrival date & time: 05/27/21  1200     History Chief Complaint  Patient presents with   Dizziness    Christy Fletcher is a 80 y.o. female.  She is here for evaluation of a fall today.  She said she was walking on the hall when she tripped on a chair fell and struck her face.  No loss of consciousness.  Has a laceration above her right eye.  She also states that yesterday she was dizzy and she passed out at home.  She does not know how long she passed out.  She denies any injury from that.  She does not think she has been eating that great.  Lives at home with her husband.  Did not have any dizziness today preceding or after the fall.  The history is provided by the patient.  Fall This is a new problem. The problem has been resolved. Pertinent negatives include no chest pain, no abdominal pain, no headaches and no shortness of breath. Nothing aggravates the symptoms. Nothing relieves the symptoms. She has tried nothing for the symptoms. The treatment provided no relief.      Past Medical History:  Diagnosis Date   Arthritis    Diarrhea    Hypercholesteremia    Osteopenia     Patient Active Problem List   Diagnosis Date Noted   Cerebral embolism with cerebral infarction 11/23/2017   Osteopenia 11/19/2017   Arthritis 11/19/2017   Dyslipidemia 11/19/2017   Hip fracture (HCC) 11/19/2017   Decreased range of motion of right elbow 11/19/2013   Muscle weakness (generalized) 11/19/2013   Edema 11/19/2013   Pain in joint, forearm 11/19/2013    Past Surgical History:  Procedure Laterality Date   ABDOMINAL HYSTERECTOMY     CATARACT EXTRACTION     COLONOSCOPY     COLONOSCOPY N/A 05/14/2013   Procedure: COLONOSCOPY;  Surgeon: West Bali, MD;  Location: [facility];  Service: Endoscopy;  Laterality: N/A;  10:30 AM-rescheduled to 8:45am Doris notified pt   HERNIA REPAIR     HIP ARTHROPLASTY Right 11/20/2017    Procedure: RIGHT HIP HEMIARTHROPLASTY;  Surgeon: Eldred Manges, MD;  Location: [facility] OR;  Service: Orthopedics;  Laterality: Right;   LOOP RECORDER INSERTION N/A 11/24/2017   Procedure: LOOP RECORDER INSERTION;  Surgeon: Regan Lemming, MD;  Location: [facility] INVASIVE CV LAB;  Service: Cardiovascular;  Laterality: N/A;   TEE WITHOUT CARDIOVERSION N/A 11/24/2017   Procedure: TRANSESOPHAGEAL ECHOCARDIOGRAM (TEE) WITH LOOP;  Surgeon: Laurey Morale, MD;  Location: [facility] ENDOSCOPY;  Service: Cardiovascular;  Laterality: N/A;     OB History   No obstetric history on file.     Family History  Problem Relation Age of Onset   Colon cancer Neg Hx     Social History   Tobacco Use   Smoking status: Never   Smokeless tobacco: Never  Vaping Use   Vaping Use: Never used  Substance Use Topics   Alcohol use: No   Drug use: No    Home Medications Prior to Admission medications   Medication Sig Start Date End Date Taking? Authorizing Provider  aspirin EC 81 MG EC tablet Take 1 tablet (81 mg total) by mouth daily. 11/25/17   Hongalgi, Maximino Greenland, MD  calcium carbonate (OS-CAL) 600 MG TABS tablet Take 1,200 mg by mouth daily.    [provider]  lovastatin (MEVACOR) 20 MG tablet Take 20 mg by  mouth at bedtime.    [provider]    Allergies    Plavix [clopidogrel bisulfate]  Review of Systems   Review of Systems  Constitutional:  Negative for fever.  HENT:  Negative for sore throat.   Eyes:  Negative for visual disturbance.  Respiratory:  Negative for shortness of breath.   Cardiovascular:  Negative for chest pain.  Gastrointestinal:  Negative for abdominal pain.  Genitourinary:  Negative for dysuria.  Musculoskeletal:  Negative for neck pain.  Skin:  Positive for wound. Negative for rash.  Neurological:  Positive for dizziness. Negative for headaches.   Physical Exam Updated Vital Signs BP 129/78 (BP Location: Right Arm)   Pulse 75   Temp 98 F (36.7 C) (Oral)   Resp 18    Ht 5' 1.5" (1.562 m)   Wt 54.4 kg   SpO2 98%   BMI 22.31 kg/m   Physical Exam Vitals and nursing note reviewed.  Constitutional:      General: She is not in acute distress.    Appearance: She is well-developed.  HENT:     Head: Normocephalic.     Comments: She has a vertical laceration above her right eye through her eyebrow.  There is no step-offs.  No crepitus. Eyes:     Conjunctiva/sclera: Conjunctivae normal.  Cardiovascular:     Rate and Rhythm: Normal rate and regular rhythm.     Heart sounds: No murmur heard. Pulmonary:     Effort: Pulmonary effort is normal. No respiratory distress.     Breath sounds: Normal breath sounds.  Abdominal:     Palpations: Abdomen is soft.     Tenderness: There is no abdominal tenderness.  Musculoskeletal:        General: No deformity or signs of injury. Normal range of motion.     Cervical back: Neck supple.  Skin:    General: Skin is warm and dry.  Neurological:     General: No focal deficit present.     Mental Status: She is alert and oriented to person, place, and time.     Cranial Nerves: No cranial nerve deficit.     Sensory: No sensory deficit.     Motor: No weakness.     Gait: Gait normal.    ED Results / Procedures / Treatments   Labs (all labs ordered are listed, but only abnormal results are displayed) Labs Reviewed  BASIC METABOLIC PANEL - Abnormal; Notable for the following components:      Result Value   Potassium 3.2 (*)    Glucose, Bld 105 (*)    Calcium 8.8 (*)    All other components within normal limits  CBC    EKG EKG Interpretation  Date/Time:  Wednesday May 27 2021 12:26:58 EDT Ventricular Rate:  69 PR Interval:  134 QRS Duration: 76 QT Interval:  398 QTC Calculation: 426 R Axis:   57 Text Interpretation: Sinus rhythm with Premature atrial complexes Anteroseptal infarct , age undetermined Abnormal ECG No significant change since prior 3/19 Confirmed by Meridee Score [phone]) on 05/27/2021  3:04:38 PM  Radiology CT HEAD WO CONTRAST ( )  Result Date: 05/27/2021 CLINICAL DATA:  Dizziness.  Fall with laceration. EXAM: CT HEAD WITHOUT CONTRAST TECHNIQUE: Contiguous axial images were obtained from the base of the skull through the vertex without intravenous contrast. COMPARISON:  Head MRI 11/21/2017.  Head and neck CTA 11/23/2017. FINDINGS: Brain: There is no evidence of an acute infarct, intracranial hemorrhage, mass, midline shift, or extra-axial fluid  collection. The ventricles and sulci are within normal limits for age. Vascular: Calcified atherosclerosis at the skull base. No hyperdense vessel. Skull: No fracture or suspicious osseous lesion. Sinuses/Orbits: Extensive left frontal, left greater than right ethmoid, and right sphenoid sinus opacification. Partially visualized mucosal thickening and fluid in the right maxillary sinus. Clear mastoid air cells. Bilateral cataract extraction. Other: None. IMPRESSION: 1. No evidence of acute intracranial abnormality. 2. Sinusitis. Electronically Signed   By: Sebastian Ache M.D.   On: 05/27/2021 14:25    Procedures .Marland KitchenLaceration Repair  Date/Time: 05/27/2021 4:26 PM Performed by: Terrilee Files, MD Authorized by: Terrilee Files, MD   Consent:    Consent obtained:  Verbal   Consent given by:  Patient   Risks discussed:  Infection, pain, poor cosmetic result, poor wound healing and retained foreign body   Alternatives discussed:  No treatment and delayed treatment Universal protocol:    Procedure explained and questions answered to patient or proxy's satisfaction: yes     Patient identity confirmed:  Verbally with patient Laceration details:    Location:  Face   Face location:  R eyebrow   Length (cm):  3 Treatment:    Area cleansed with:  Saline   Amount of cleaning:  Standard   Irrigation solution:  Sterile saline Skin repair:    Repair method:  Tissue adhesive Approximation:    Approximation:  Close Post-procedure details:     Dressing:  Open (no dressing)   Procedure completion:  Tolerated well, no immediate complications   Medications Ordered in ED Medications  Tdap (BOOSTRIX) injection 0.5 mL (has no administration in time range)    ED Course  I have reviewed the triage vital signs and the nursing notes.  Pertinent labs & imaging results that were available during my care of the patient were reviewed by me and considered in my medical decision making (see chart for details).  Clinical Course as of 05/28/21 1041  Wed May 27, 2021  1623 Patient ambulated in the department without any difficulty.  Family here feels she is at her baseline. [MB]    Clinical Course User Index [MB] Terrilee Files, MD   MDM Rules/Calculators/A&P                          80 year old female here after mechanical fall striking her face and having a laceration.  Head CT ordered and interpreted by me as no acute findings.  Wound reinforced with some Dermabond.  She also said she was dizzy yesterday.  Lab work EKG unremarkable.  No dizziness today.  CT does not show any acute stroke findings.  Recommended close follow-up with her primary care doctor.  Differential includes mechanical fall, contusion, intracranial bleed, stroke.  Final Clinical Impression(s) / ED Diagnoses Final diagnoses:  Fall, initial encounter  Eyebrow laceration, right, initial encounter  Dizziness    Rx / DC Orders ED Discharge Orders     None        Terrilee Files, MD 05/28/21 1043

## 2021-05-27 NOTE — ED Triage Notes (Addendum)
Pt to er, pt states that she had a dizzy episode yesterday and fell, states that she fell again today, pt has lac to R eyebrow.  States that today she tripped on a chair.

## 2021-05-27 NOTE — Discharge Instructions (Signed)
You were seen in the emergency department for evaluation of head injury after a fall.  You had CAT scan of your head along with lab work that did not show any significant abnormalities.  Your potassium was mildly low.  You also said you had a dizzy spell and fainted yesterday.  Your EKG was unremarkable.  Please contact your primary care doctor for close follow-up.  Return to the emergency department if any worsening or concerning symptoms.

## 2021-05-27 NOTE — ED Notes (Signed)
Pt ambulated well. MD made aware.

## 2021-06-02 ENCOUNTER — Ambulatory Visit: Payer: Self-pay

## 2021-06-02 ENCOUNTER — Encounter: Payer: Self-pay | Admitting: *Deleted

## 2021-06-02 ENCOUNTER — Other Ambulatory Visit: Payer: Self-pay

## 2021-06-02 ENCOUNTER — Ambulatory Visit
Admission: EM | Admit: 2021-06-02 | Discharge: 2021-06-02 | Disposition: A | Discharge status: Home / Self Care | Payer: Medicare HMO | Attending: Internal Medicine | Admitting: Internal Medicine

## 2021-06-02 DIAGNOSIS — J019 Acute sinusitis, unspecified: Secondary | ICD-10-CM | POA: Diagnosis not present

## 2021-06-02 MED ORDER — FLUTICASONE PROPIONATE 50 MCG/ACT NA SUSP: 1.0000 | Freq: Every day | NASAL | 0 refills | Status: DC

## 2021-06-02 MED ORDER — AMOXICILLIN-POT CLAVULANATE 875-125 MG PO TABS: 1.0000 | ORAL_TABLET | Freq: Two times a day (BID) | ORAL | 0 refills | Status: DC

## 2021-06-02 MED ORDER — GUAIFENESIN ER 600 MG PO TB12: 600.0000 mg | ORAL_TABLET | Freq: Two times a day (BID) | ORAL | 0 refills | Status: AC

## 2021-06-02 NOTE — ED Provider Notes (Signed)
[facility]    CSN: [id number] Arrival date & time: 06/02/21  1338      History   Chief Complaint Chief Complaint  Patient presents with   Facial Pain   Cough    HPI Christy Fletcher is a 80 y.o. female comes to the urgent care with a 1 month history of facial pain, postnasal drip, cough, nasal congestion with yellowish-green drainage.  Patient symptoms have been persistent over the past month.  She has tried over-the-counter measures with no improvement.  She denies any fever or chills.  No shortness of breath, cough or sputum production.  Patient denies any history of seasonal allergies.  HPI  Past Medical History:  Diagnosis Date   Arthritis    Diarrhea    Hypercholesteremia    Osteopenia     Patient Active Problem List   Diagnosis Date Noted   Cerebral embolism with cerebral infarction 11/23/2017   Osteopenia 11/19/2017   Arthritis 11/19/2017   Dyslipidemia 11/19/2017   Hip fracture (HCC) 11/19/2017   Decreased range of motion of right elbow 11/19/2013   Muscle weakness (generalized) 11/19/2013   Edema 11/19/2013   Pain in joint, forearm 11/19/2013    Past Surgical History:  Procedure Laterality Date   ABDOMINAL HYSTERECTOMY     CATARACT EXTRACTION     COLONOSCOPY     COLONOSCOPY N/A 05/14/2013   Procedure: COLONOSCOPY;  Surgeon: West Bali, MD;  Location: [facility];  Service: Endoscopy;  Laterality: N/A;  10:30 AM-rescheduled to 8:45am Doris notified pt   HERNIA REPAIR     HIP ARTHROPLASTY Right 11/20/2017   Procedure: RIGHT HIP HEMIARTHROPLASTY;  Surgeon: Eldred Manges, MD;  Location: [facility] OR;  Service: Orthopedics;  Laterality: Right;   LOOP RECORDER INSERTION N/A 11/24/2017   Procedure: LOOP RECORDER INSERTION;  Surgeon: Regan Lemming, MD;  Location: [facility] INVASIVE CV LAB;  Service: Cardiovascular;  Laterality: N/A;   TEE WITHOUT CARDIOVERSION N/A 11/24/2017   Procedure: TRANSESOPHAGEAL ECHOCARDIOGRAM (TEE) WITH LOOP;  Surgeon: Laurey Morale, MD;  Location: [facility] ENDOSCOPY;  Service: Cardiovascular;  Laterality: N/A;    OB History   No obstetric history on file.      Home Medications    Prior to Admission medications   Medication Sig Start Date End Date Taking? Authorizing Provider  amoxicillin-clavulanate (AUGMENTIN) 875-125 MG tablet Take 1 tablet by mouth every 12 (twelve) hours. 06/02/21  Yes Willmar Stockinger, Britta Mccreedy, MD  fluticasone (FLONASE) 50 MCG/ACT nasal spray Place 1 spray into both nostrils daily. 06/02/21  Yes Hulbert Branscome, Britta Mccreedy, MD  guaiFENesin (MUCINEX) 600 MG 12 hr tablet Take 1 tablet (600 mg total) by mouth 2 (two) times daily for 14 days. 06/02/21 06/16/21 Yes Sadie Pickar, Britta Mccreedy, MD  aspirin EC 81 MG EC tablet Take 1 tablet (81 mg total) by mouth daily. 11/25/17   Hongalgi, Maximino Greenland, MD  calcium carbonate (OS-CAL) 600 MG TABS tablet Take 1,200 mg by mouth daily.    [provider]  lovastatin (MEVACOR) 20 MG tablet Take 20 mg by mouth at bedtime.    [provider]    Family History Family History  Problem Relation Age of Onset   Colon cancer Neg Hx     Social History Social History   Tobacco Use   Smoking status: Never   Smokeless tobacco: Never  Vaping Use   Vaping Use: Never used  Substance Use Topics   Alcohol use: No   Drug use: No  Allergies   Plavix [clopidogrel bisulfate]   Review of Systems Review of Systems  Constitutional: Negative.   HENT:  Positive for congestion, rhinorrhea, sinus pressure and sinus pain. Negative for ear discharge, ear pain, facial swelling, sore throat and voice change.   Respiratory: Negative.  Negative for cough, chest tightness and shortness of breath.   Neurological: Negative.     Physical Exam Triage Vital Signs ED Triage Vitals  Enc Vitals Group     BP 06/02/21 1348 114/78     Pulse Rate 06/02/21 1348 70     Resp 06/02/21 1348 15     Temp 06/02/21 1348 98.4 F (36.9 C)     Temp src --      SpO2 06/02/21 1348 95 %      Weight --      Height --      Head Circumference --      Peak Flow --      Pain Score 06/02/21 1349 0     Pain Loc --      Pain Edu? --      Excl. in GC? --    No data found.  Updated Vital Signs BP 114/78   Pulse 70   Temp 98.4 F (36.9 C)   Resp 15   SpO2 95%   Visual Acuity Right Eye Distance:   Left Eye Distance:   Bilateral Distance:    Right Eye Near:   Left Eye Near:    Bilateral Near:     Physical Exam Vitals and nursing note reviewed.  Constitutional:      General: She is not in acute distress.    Appearance: She is not ill-appearing.  HENT:     Right Ear: Tympanic membrane normal.     Left Ear: Tympanic membrane normal.     Nose: Nose normal.     Mouth/Throat:     Mouth: Mucous membranes are moist.     Pharynx: No posterior oropharyngeal erythema.  Eyes:     Extraocular Movements: Extraocular movements intact.     Pupils: Pupils are equal, round, and reactive to light.  Cardiovascular:     Rate and Rhythm: Normal rate and regular rhythm.  Pulmonary:     Effort: Pulmonary effort is normal.     Breath sounds: Normal breath sounds.  Neurological:     Mental Status: She is alert.     UC Treatments / Results  Labs (all labs ordered are listed, but only abnormal results are displayed) Labs Reviewed - No data to display  EKG   Radiology No results found.  Procedures Procedures (including critical care time)  Medications Ordered in UC Medications - No data to display  Initial Impression / Assessment and Plan / UC Course  I have reviewed the triage vital signs and the nursing notes.  Pertinent labs & imaging results that were available during my care of the patient were reviewed by me and considered in my medical decision making (see chart for details).     1.  Acute sinusitis with symptoms greater than 10 days, Augmentin 1 tablet twice daily for 7 days Fluticasone nasal spray Mucinex as needed for thick sputum Return to urgent care if  symptoms worsen. Final Clinical Impressions(s) / UC Diagnoses   Final diagnoses:  Acute sinusitis with symptoms > 10 days     Discharge Instructions      Please use medications as prescribed If symptoms worsen please return to the urgent care to be reevaluated  ED Prescriptions     Medication Sig Dispense Auth. Provider   amoxicillin-clavulanate (AUGMENTIN) 875-125 MG tablet Take 1 tablet by mouth every 12 (twelve) hours. 14 tablet Lindey Renzulli, Britta Mccreedy, MD   fluticasone (FLONASE) 50 MCG/ACT nasal spray Place 1 spray into both nostrils daily. 16 g Merrilee Jansky, MD   guaiFENesin (MUCINEX) 600 MG 12 hr tablet Take 1 tablet (600 mg total) by mouth 2 (two) times daily for 14 days. 28 tablet Jasminne Mealy, Britta Mccreedy, MD      PDMP not reviewed this encounter.   Merrilee Jansky, MD 06/02/21 [phone]

## 2021-06-02 NOTE — Discharge Instructions (Addendum)
Please use medications as prescribed If symptoms worsen please return to the urgent care to be reevaluated. 

## 2021-06-02 NOTE — ED Triage Notes (Signed)
Pt reports sinus problems for ome month with a cough.

## 2021-06-08 ENCOUNTER — Ambulatory Visit (INDEPENDENT_AMBULATORY_CARE_PROVIDER_SITE_OTHER): Payer: Medicare HMO

## 2021-06-08 DIAGNOSIS — I639 Cerebral infarction, unspecified: Secondary | ICD-10-CM

## 2021-06-10 LAB — CUP PACEART REMOTE DEVICE CHECK
Date Time Interrogation Session: 20220916011840
Implantable Pulse Generator Implant Date: 20190307

## 2021-06-15 NOTE — Progress Notes (Signed)
Carelink Summary Report / Loop Recorder 

## 2021-07-13 ENCOUNTER — Ambulatory Visit (INDEPENDENT_AMBULATORY_CARE_PROVIDER_SITE_OTHER): Payer: Medicare HMO

## 2021-07-13 DIAGNOSIS — I639 Cerebral infarction, unspecified: Secondary | ICD-10-CM | POA: Diagnosis not present

## 2021-07-13 LAB — CUP PACEART REMOTE DEVICE CHECK
Date Time Interrogation Session: 20221017012236
Implantable Pulse Generator Implant Date: 20190307

## 2021-07-21 NOTE — Progress Notes (Signed)
Carelink Summary Report / Loop Recorder 

## 2021-08-14 ENCOUNTER — Other Ambulatory Visit (HOSPITAL_COMMUNITY): Payer: Self-pay | Admitting: Family Medicine

## 2021-08-14 DIAGNOSIS — Z1231 Encounter for screening mammogram for malignant neoplasm of breast: Secondary | ICD-10-CM

## 2021-08-17 ENCOUNTER — Ambulatory Visit (INDEPENDENT_AMBULATORY_CARE_PROVIDER_SITE_OTHER): Payer: Medicare HMO

## 2021-08-17 ENCOUNTER — Telehealth: Payer: Self-pay

## 2021-08-17 DIAGNOSIS — I639 Cerebral infarction, unspecified: Secondary | ICD-10-CM

## 2021-08-17 LAB — CUP PACEART REMOTE DEVICE CHECK
Date Time Interrogation Session: 20221117002416
Implantable Pulse Generator Implant Date: 20190307

## 2021-08-17 NOTE — Telephone Encounter (Signed)
Linq alert received- device has reached RRT as of 11/27.  Attempted to contact pt to discuss next steps.  No answer.  DPR on file.  LVM with device clinic # and hours.    Carelink/ paceart have not been updated.

## 2021-08-21 DIAGNOSIS — E7800 Pure hypercholesterolemia, unspecified: Secondary | ICD-10-CM | POA: Diagnosis not present

## 2021-08-21 DIAGNOSIS — Z23 Encounter for immunization: Secondary | ICD-10-CM | POA: Diagnosis not present

## 2021-08-21 DIAGNOSIS — M2141 Flat foot [pes planus] (acquired), right foot: Secondary | ICD-10-CM | POA: Diagnosis not present

## 2021-08-21 DIAGNOSIS — L84 Corns and callosities: Secondary | ICD-10-CM | POA: Diagnosis not present

## 2021-08-21 NOTE — Telephone Encounter (Signed)
Second unsuccessful telephone call to patient to discuss linq alert for device RRT. Hip[employer] compliant VM message left requesting call back to [phone].

## 2021-08-25 NOTE — Progress Notes (Signed)
Carelink Summary Report / Loop Recorder 

## 2021-08-27 NOTE — Telephone Encounter (Signed)
Successful telephone encounter with patient to discuss RRT linq status. Discussed removal of device vs leaving implanted. Patient prefers to have device removed. Will send to scheduling to begin procedureal process and scheduleing. Patient is marked inactive in paceart and discontinued in carelink. She is instructed to unplug remote monitor. Address confirmed. Return kit sent. All patient questions answered to their satisfaction. Patient appreciative of notification.

## 2021-09-10 DIAGNOSIS — Z0100 Encounter for examination of eyes and vision without abnormal findings: Secondary | ICD-10-CM | POA: Diagnosis not present

## 2021-09-23 ENCOUNTER — Other Ambulatory Visit: Payer: Self-pay

## 2021-09-23 ENCOUNTER — Ambulatory Visit (HOSPITAL_COMMUNITY)
Admission: RE | Admit: 2021-09-23 | Discharge: 2021-09-23 | Disposition: A | Discharge status: Home / Self Care | Payer: Medicare HMO | Source: Ambulatory Visit | Attending: Family Medicine | Admitting: Family Medicine

## 2021-09-23 DIAGNOSIS — Z1231 Encounter for screening mammogram for malignant neoplasm of breast: Secondary | ICD-10-CM | POA: Insufficient documentation

## 2021-10-15 ENCOUNTER — Encounter: Payer: Self-pay | Admitting: Cardiology

## 2021-10-15 ENCOUNTER — Ambulatory Visit: Payer: Medicare HMO | Admitting: Cardiology

## 2021-10-15 ENCOUNTER — Other Ambulatory Visit: Payer: Self-pay

## 2021-10-15 VITALS — BP 130/86 | HR 67 | Ht 61.5 in | Wt 115.4 lb

## 2021-10-15 DIAGNOSIS — I639 Cerebral infarction, unspecified: Secondary | ICD-10-CM | POA: Diagnosis not present

## 2021-10-15 NOTE — Progress Notes (Signed)
Electrophysiology Office Note   Date:  10/15/2021   ID:  Christy Fletcher, Christy Fletcher Apr 02, 1941, MRN [id number]  PCP:  Christy Evens, MD  Cardiologist:   Primary Electrophysiologist:  Helaman Mecca Meredith Leeds, MD    Chief Complaint: cryptogenic stroke   History of Present Illness: Christy Fletcher is a 81 y.o. female who is being seen today for the evaluation of cryptogenic stroke at the request of Christy Evens, MD. Presenting today for electrophysiology evaluation.  She has a history significant for cryptogenic stroke.  She presented to the hospital March 2019 with a mechanical fall.  Head CT showed left frontal infarct in the language area, embolic secondary to unknown source.  She had a Linq monitor implanted during that hospital stay.  No atrial fibrillation has been shown.  She is ready for Linq monitor explant.  Today, she denies symptoms of palpitations, chest pain, shortness of breath, orthopnea, PND, lower extremity edema, claudication, dizziness, presyncope, syncope, bleeding, or neurologic sequela. The patient is tolerating medications without difficulties.    Past Medical History:  Diagnosis Date   Arthritis    Diarrhea    Hypercholesteremia    Osteopenia    Past Surgical History:  Procedure Laterality Date   ABDOMINAL HYSTERECTOMY     CATARACT EXTRACTION     COLONOSCOPY     COLONOSCOPY N/A 05/14/2013   Procedure: COLONOSCOPY;  Surgeon: Christy Binder, MD;  [facility];  Service: Endoscopy;  Laterality: N/A;  10:30 AM-rescheduled to 8:45am Christy Fletcher notified pt   HERNIA REPAIR     HIP ARTHROPLASTY Right 11/20/2017   Procedure: RIGHT HIP HEMIARTHROPLASTY;  Surgeon: Christy Killings, MD;  Location: [place];  Service: Orthopedics;  Laterality: Right;   LOOP RECORDER INSERTION N/A 11/24/2017   Procedure: LOOP RECORDER INSERTION;  Surgeon: Constance Haw, MD;  Location: [place] [facility];  Service: Cardiovascular;  Laterality: N/A;   TEE WITHOUT CARDIOVERSION N/A  11/24/2017   Procedure: TRANSESOPHAGEAL ECHOCARDIOGRAM (TEE) WITH LOOP;  Surgeon: Christy Dresser, MD;  [facility];  Service: Cardiovascular;  Laterality: N/A;     Current Outpatient Medications  Medication Sig Dispense Refill   amoxicillin-clavulanate (AUGMENTIN) 875-125 MG tablet Take 1 tablet by mouth every 12 (twelve) hours. 14 tablet 0   aspirin EC 81 MG EC tablet Take 1 tablet (81 mg total) by mouth daily. 30 tablet 0   calcium carbonate (OS-CAL) 600 MG TABS tablet Take 1,200 mg by mouth daily.     fluticasone (FLONASE) 50 MCG/ACT nasal spray Place 1 spray into both nostrils daily. 16 g 0   lovastatin (MEVACOR) 20 MG tablet Take 20 mg by mouth at bedtime.     No current facility-administered medications for this visit.    Allergies:   Plavix [clopidogrel bisulfate]   Social History:  The patient  reports that she has never smoked. She has never used smokeless tobacco. She reports that she does not drink alcohol and does not use drugs.   Family History: Heart disease, thyroid disease, hypertension  ROS:  Please see the history of present illness.   Otherwise, review of systems is positive for none.   All other systems are reviewed and negative.    PHYSICAL EXAM: VS:  Ht 5' 1.5" (1.562 m)    BMI 22.31 kg/m  , BMI Body mass index is 22.31 kg/m. GEN: Well nourished, well developed, in no acute distress  HEENT: normal  Neck: no JVD, carotid bruits, or masses Cardiac: RRR; no murmurs,  rubs, or gallops,no edema  Respiratory:  clear to auscultation bilaterally, normal work of breathing GI: soft, nontender, nondistended, + BS MS: no deformity or atrophy  Skin: warm and dry, device pocket is well healed Neuro:  Strength and sensation are intact Psych: euthymic mood, full affect  EKG:  EKG is ordered today. Personal review of the ekg ordered shows sinus rhythm, rate 69  Device interrogation is reviewed today in detail.  See PaceArt for details.   Recent  Labs: 05/27/2021: BUN 10; Creatinine, Ser 0.75; Hemoglobin 12.6; Platelets 226; Potassium 3.2; Sodium 140    Lipid Panel     Component Value Date/Time   CHOL 139 11/22/2017 0514   TRIG 122 11/22/2017 0514   HDL 51 11/22/2017 0514   CHOLHDL 2.7 11/22/2017 0514   VLDL 24 11/22/2017 0514   LDLCALC 64 11/22/2017 0514     Wt Readings from Last 3 Encounters:  05/27/21 120 lb (54.4 kg)  06/19/19 138 lb 3.2 oz (62.7 kg)  09/25/18 135 lb (61.2 kg)      Other studies Reviewed: Additional studies/ records that were reviewed today include: TTE 11/22/17  Review of the above records today demonstrates:  - Left ventricle: The cavity size was normal. Systolic function was    normal. The estimated ejection fraction was in the range of 60%    to 65%. Wall motion was normal; there were no regional wall    motion abnormalities. Doppler parameters are consistent with    abnormal left ventricular relaxation (grade 1 diastolic    dysfunction).  - Atrial septum: A patent foramen ovale cannot be excluded. There    was an atrial septal aneurysm.  - Tricuspid valve: There was moderate regurgitation directed    centrally.  - Pulmonary arteries: Systolic pressure was mildly increased. PA    peak pressure: 42 mm Hg (S).    ASSESSMENT AND PLAN:  1.  Cryptogenic stroke: Unknown as the cause.  She had an extensive work-up while she was hospitalized.  She had a Linq monitor implanted that showed no atrial fibrillation.  At this point, Linq monitor battery has run out.  We discussed the possibility of Linq monitor explant, but she is concerned about the cost.  She Christy Fletcher call her insurance to see what the cost would be and may wish to have it explanted at that time.  Current medicines are reviewed at length with the patient today.   The patient does not have concerns regarding her medicines.  The following changes were made today:  none  Labs/ tests ordered today include:  No orders of the defined types were  placed in this encounter.    Disposition:   FU with Christy Fletcher as needed d  Signed, Christy Fletcher Meredith Leeds, MD  10/15/2021 3:40 PM     [facility] [address] [place] [address] [phone] (office) [phone] (fax)

## 2021-10-15 NOTE — Patient Instructions (Signed)
Medication Instructions:  Your physician recommends that you continue on your current medications as directed. Please refer to the Current Medication list given to you today.  *If you need a refill on your cardiac medications before your next appointment, please call your pharmacy*   Lab Work: None ordered If you have labs (blood work) drawn today and your tests are completely normal, you will receive your results only by: MyChart Message (if you have MyChart) OR A paper copy in the mail If you have any lab test that is abnormal or we need to change your treatment, we will call you to review the results.   Testing/Procedures: None ordered   Follow-Up: At [facility] you and your health needs are our priority.  As part of our continuing mission to provide you with exceptional heart care, we have created designated Provider Care Teams.  These Care Teams include your primary Cardiologist (physician) and Advanced Practice Providers (APPs -  Physician Assistants and Nurse Practitioners) who all work together to provide you with the care you need, when you need it.  We recommend signing up for the patient portal called "MyChart".  Sign up information is provided on this After Visit Summary.  MyChart is used to connect with patients for Virtual Visits (Telemedicine).  Patients are able to view lab/test results, encounter notes, upcoming appointments, etc.  Non-urgent messages can be sent to your provider as well.   To learn more about what you can do with MyChart, go to [email or web address].    Your next appointment:   To be   determined  The format for your next appointment:   In Person  Provider:   Loman Brooklyn, MD    Thank you for choosing [facility]!!   Dory Horn, RN [phone]   Other Instructions  Please call and check with your insurance of the cost of the removal of this device. Loop recorder explant CPT code is: [phone]

## 2021-11-09 DIAGNOSIS — H02403 Unspecified ptosis of bilateral eyelids: Secondary | ICD-10-CM | POA: Diagnosis not present

## 2021-11-09 DIAGNOSIS — H43813 Vitreous degeneration, bilateral: Secondary | ICD-10-CM | POA: Diagnosis not present

## 2021-11-09 DIAGNOSIS — H31091 Other chorioretinal scars, right eye: Secondary | ICD-10-CM | POA: Diagnosis not present

## 2021-11-09 DIAGNOSIS — Z961 Presence of intraocular lens: Secondary | ICD-10-CM | POA: Diagnosis not present

## 2021-12-24 DIAGNOSIS — L110 Acquired keratosis follicularis: Secondary | ICD-10-CM | POA: Diagnosis not present

## 2021-12-24 DIAGNOSIS — M79674 Pain in right toe(s): Secondary | ICD-10-CM | POA: Diagnosis not present

## 2021-12-24 DIAGNOSIS — M2041 Other hammer toe(s) (acquired), right foot: Secondary | ICD-10-CM | POA: Diagnosis not present

## 2021-12-24 DIAGNOSIS — M79671 Pain in right foot: Secondary | ICD-10-CM | POA: Diagnosis not present

## 2021-12-25 DIAGNOSIS — E782 Mixed hyperlipidemia: Secondary | ICD-10-CM | POA: Diagnosis not present

## 2021-12-25 DIAGNOSIS — E7800 Pure hypercholesterolemia, unspecified: Secondary | ICD-10-CM | POA: Diagnosis not present

## 2021-12-25 DIAGNOSIS — E559 Vitamin D deficiency, unspecified: Secondary | ICD-10-CM | POA: Diagnosis not present

## 2021-12-26 LAB — LAB REPORT - SCANNED: EGFR: 85

## 2022-05-11 DIAGNOSIS — M79675 Pain in left toe(s): Secondary | ICD-10-CM | POA: Diagnosis not present

## 2022-05-11 DIAGNOSIS — M79672 Pain in left foot: Secondary | ICD-10-CM | POA: Diagnosis not present

## 2022-05-11 DIAGNOSIS — L03032 Cellulitis of left toe: Secondary | ICD-10-CM | POA: Diagnosis not present

## 2022-06-11 ENCOUNTER — Encounter: Payer: Self-pay | Admitting: Emergency Medicine

## 2022-06-11 ENCOUNTER — Ambulatory Visit: Payer: Self-pay

## 2022-06-11 ENCOUNTER — Ambulatory Visit
Admission: EM | Admit: 2022-06-11 | Discharge: 2022-06-11 | Disposition: A | Discharge status: Home / Self Care | Payer: Medicare HMO | Attending: Family Medicine | Admitting: Family Medicine

## 2022-06-11 DIAGNOSIS — J3089 Other allergic rhinitis: Secondary | ICD-10-CM | POA: Diagnosis not present

## 2022-06-11 DIAGNOSIS — J0101 Acute recurrent maxillary sinusitis: Secondary | ICD-10-CM | POA: Diagnosis not present

## 2022-06-11 IMAGING — DX DG KNEE COMPLETE 4+V*R*
4 series · 4 of 4 positions shown · non-contrast
Comparison: None.

CLINICAL DATA: Right knee pain

EXAM:
RIGHT KNEE - COMPLETE 4+ VIEW

[knee ap]
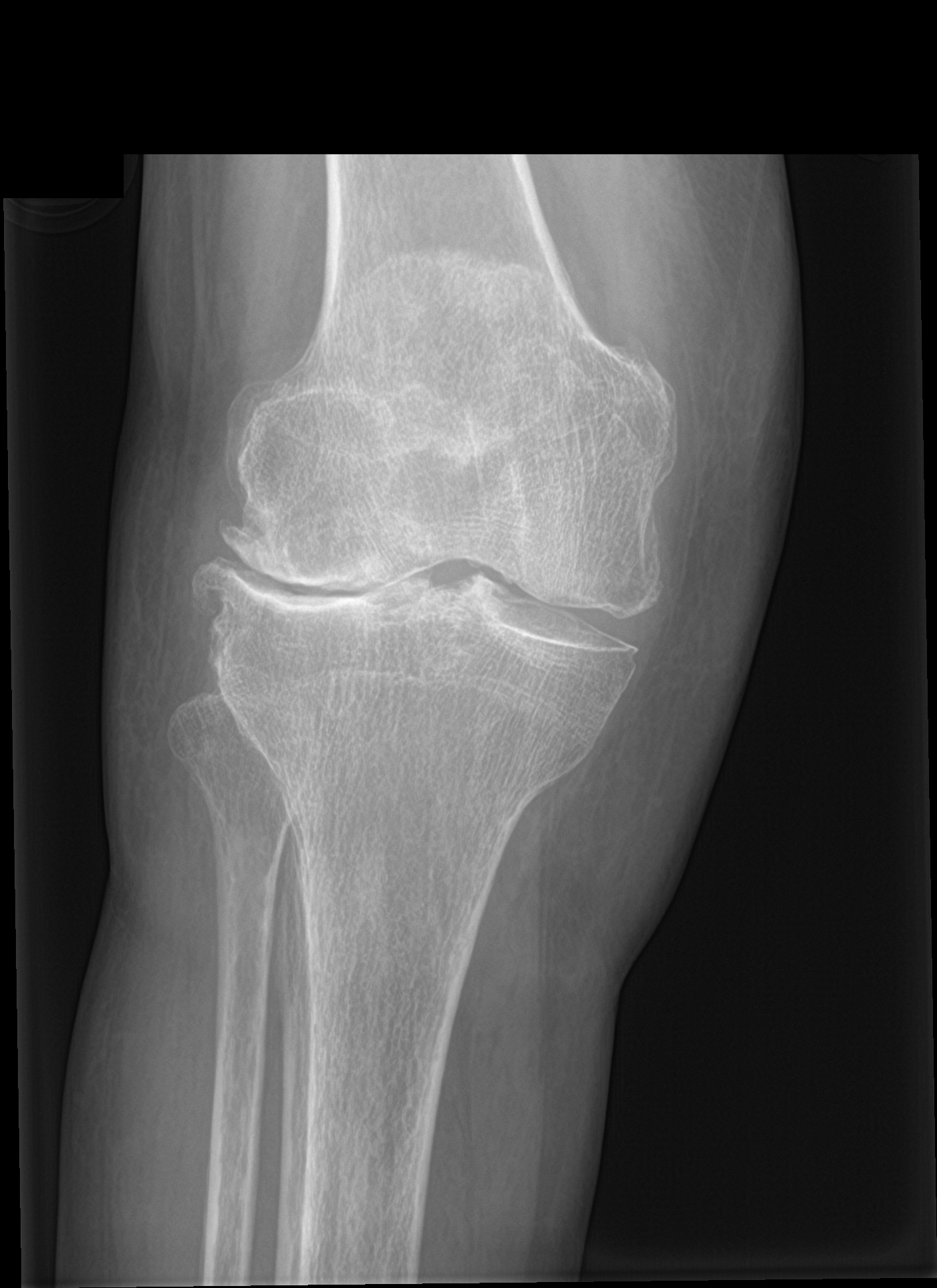

[knee obl (1 of 2)]
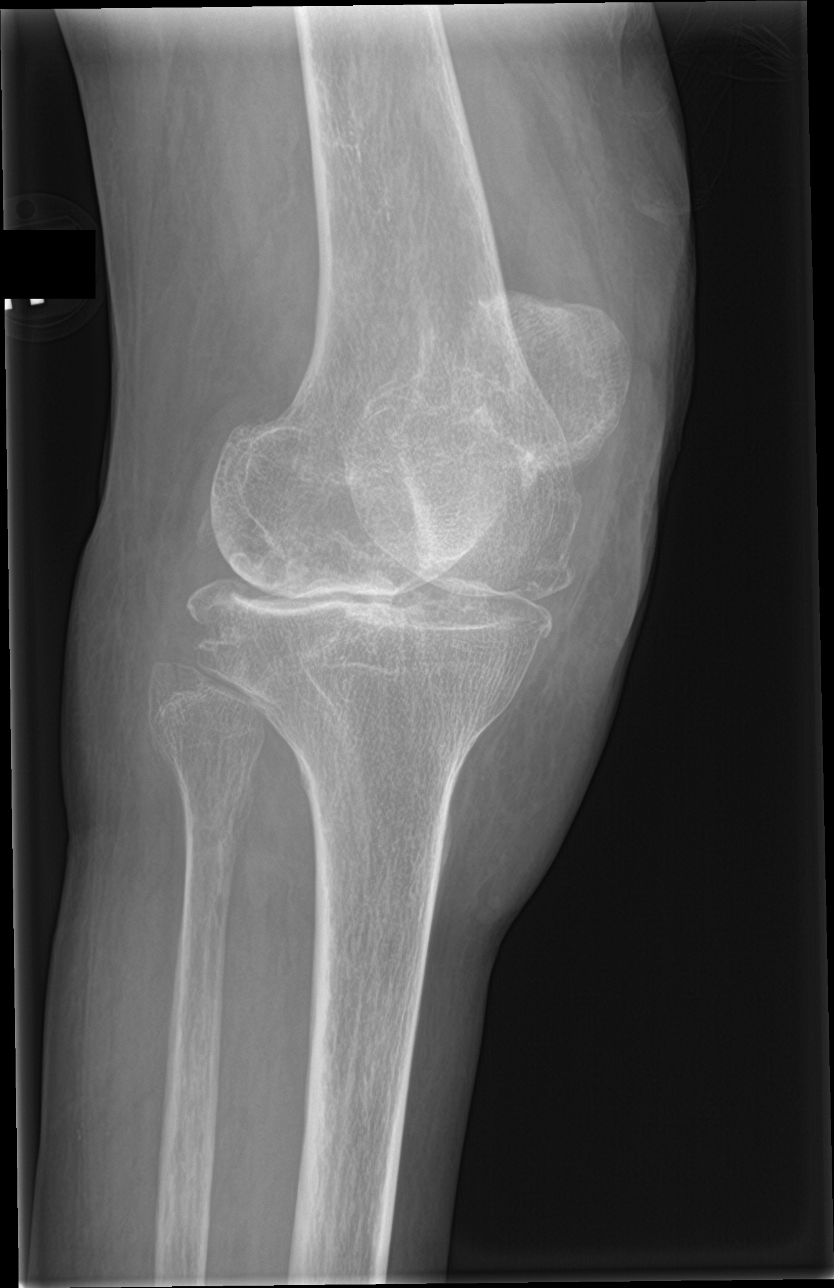

[knee obl (2 of 2)]
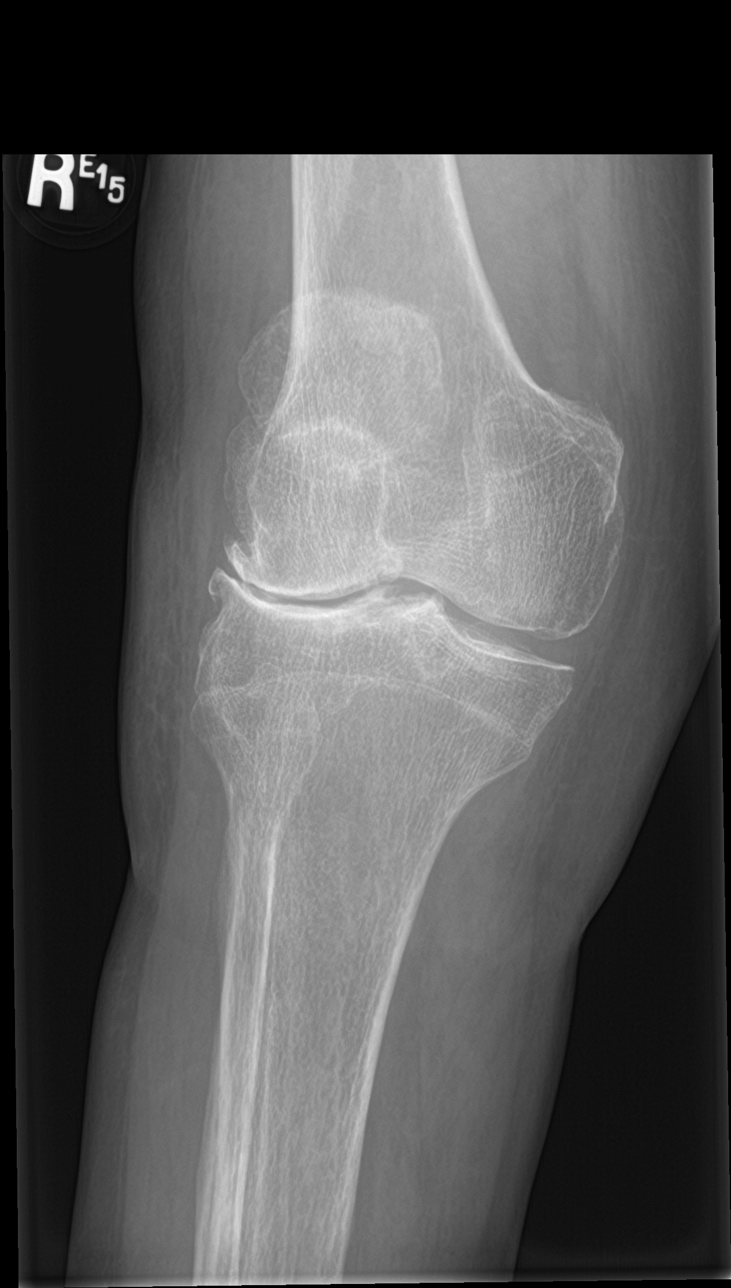

[knee lat]
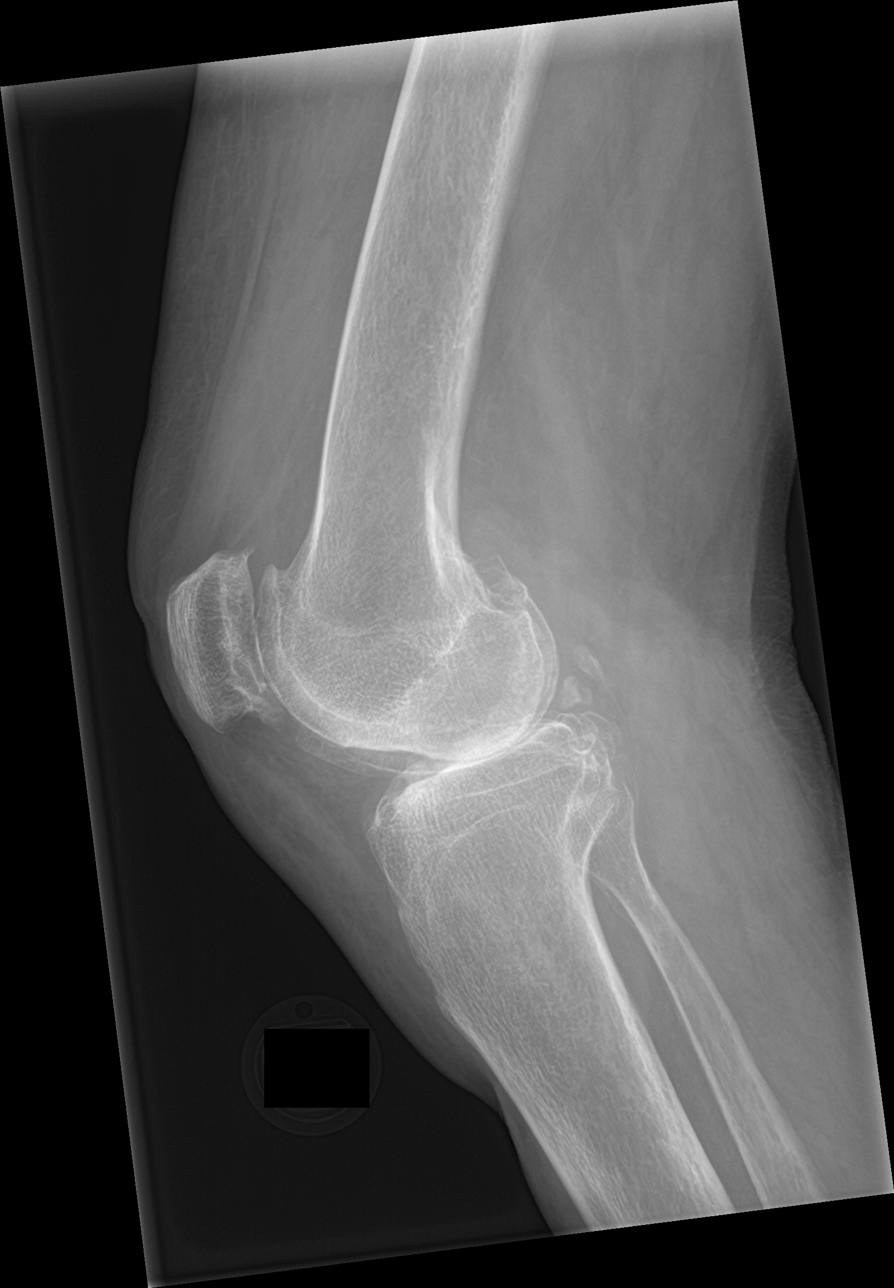

[4 of 4 positions shown; findings below may reference images not displayed]

FINDINGS: Tricompartmental degenerative change with joint space narrowing
spurring most severe in the lateral joint compartment. Negative for
fracture. Small joint effusion.
IMPRESSION: Tricompartmental degenerative change most severe in the lateral
joint compartment.

## 2022-06-11 IMAGING — DX DG HIP (WITH OR WITHOUT PELVIS) 2-3V*R*
3 series · 3 of 3 positions shown · non-contrast
Comparison: 12/06/2017

CLINICAL DATA: Right hip pain

EXAM:
DG HIP (WITH OR WITHOUT PELVIS) 2-3V RIGHT

[pelvis ap]
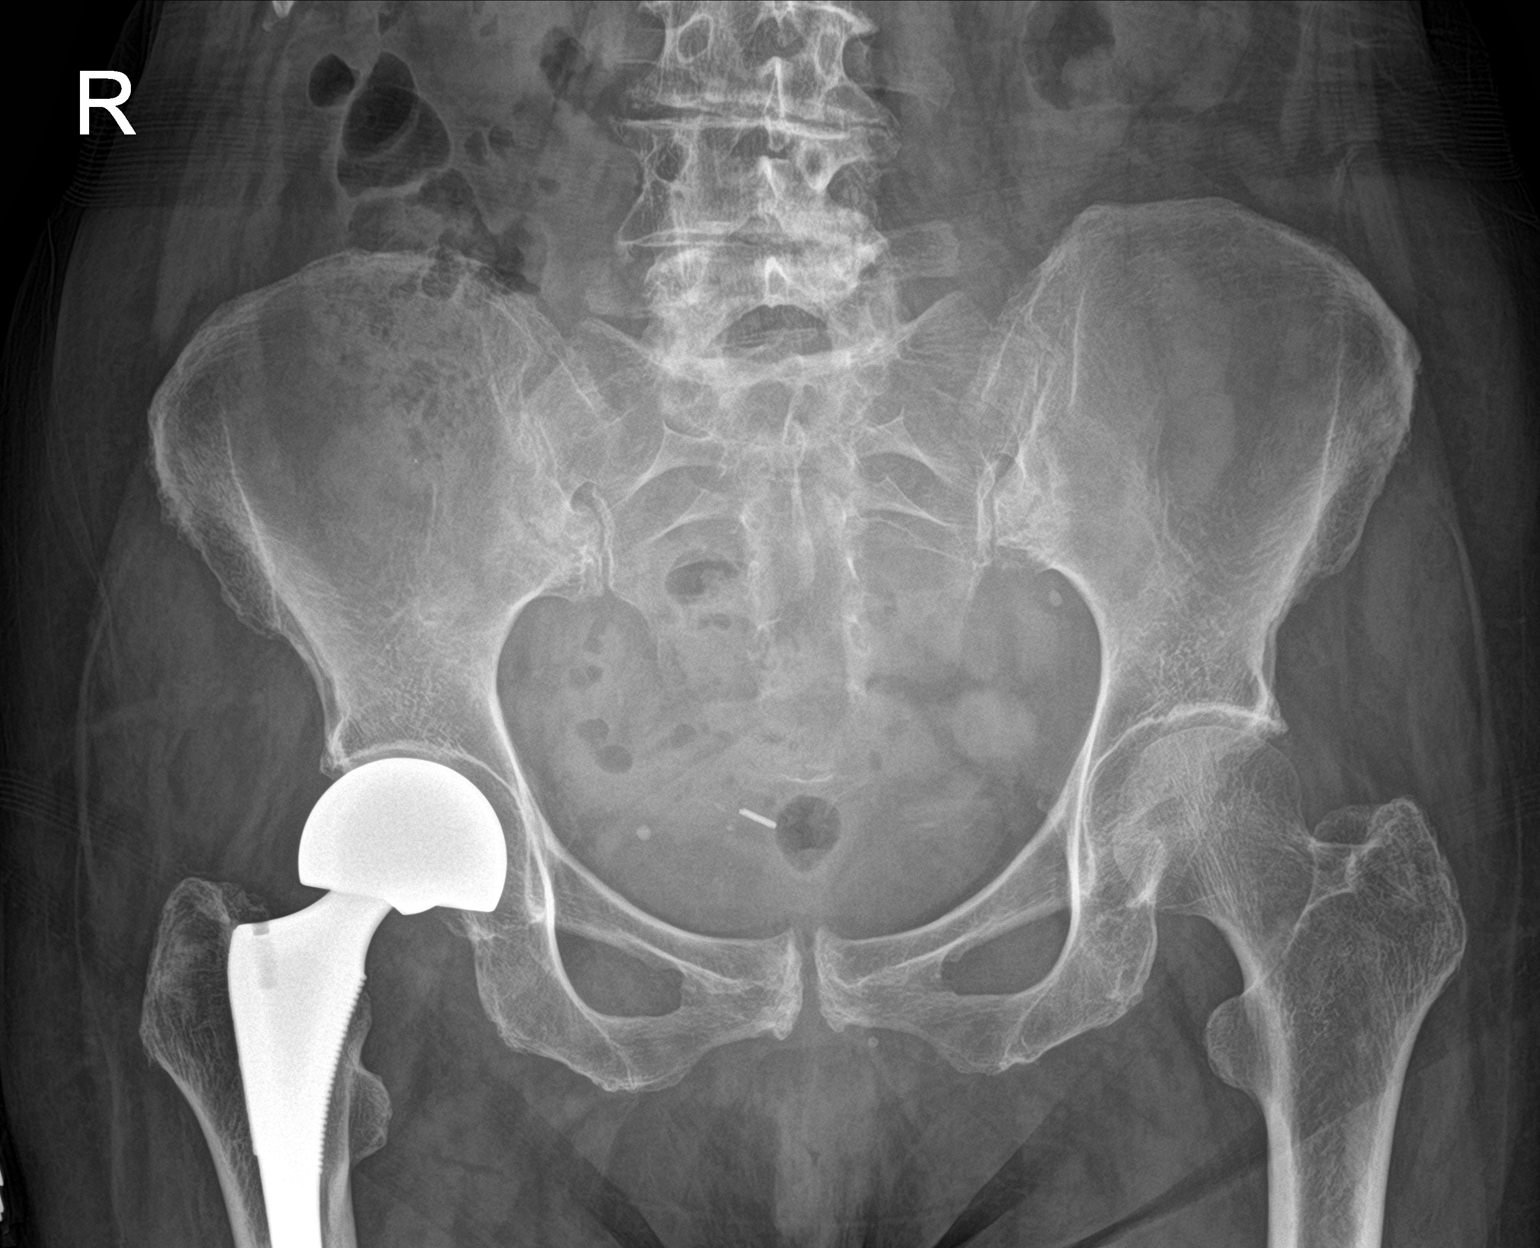

[hip lat]
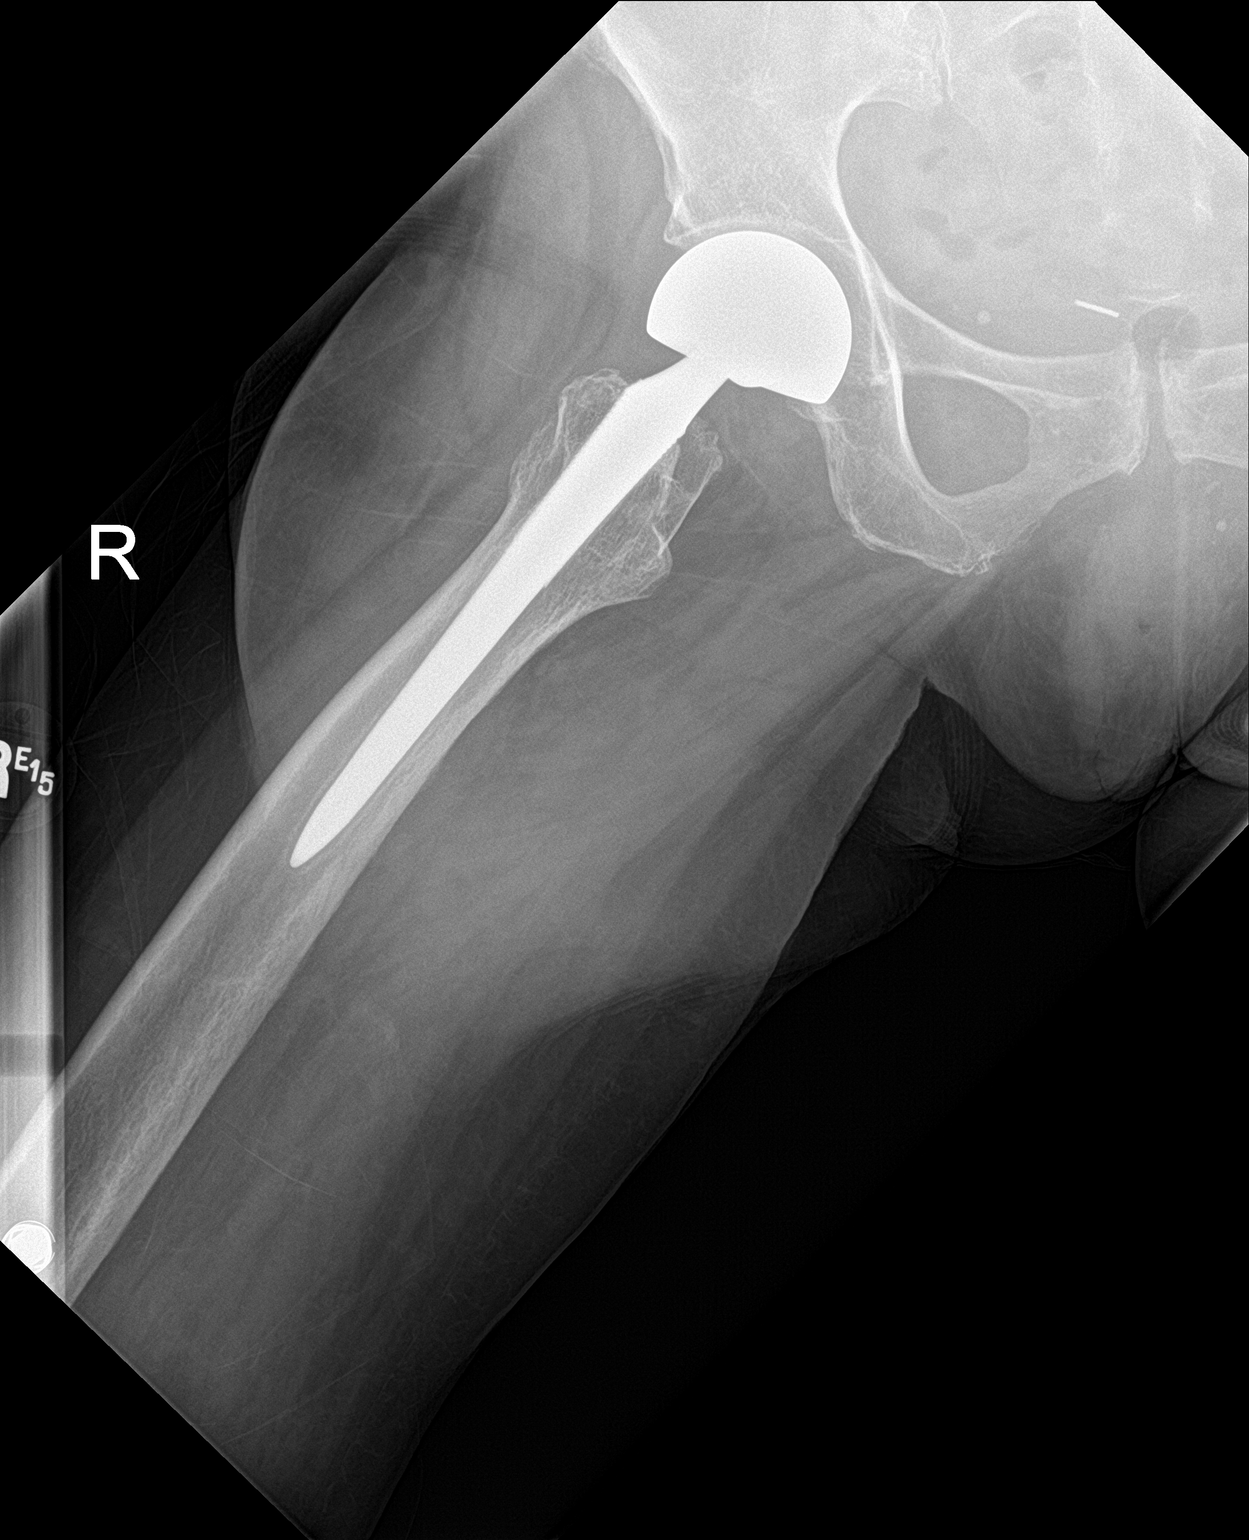

[hip ap]
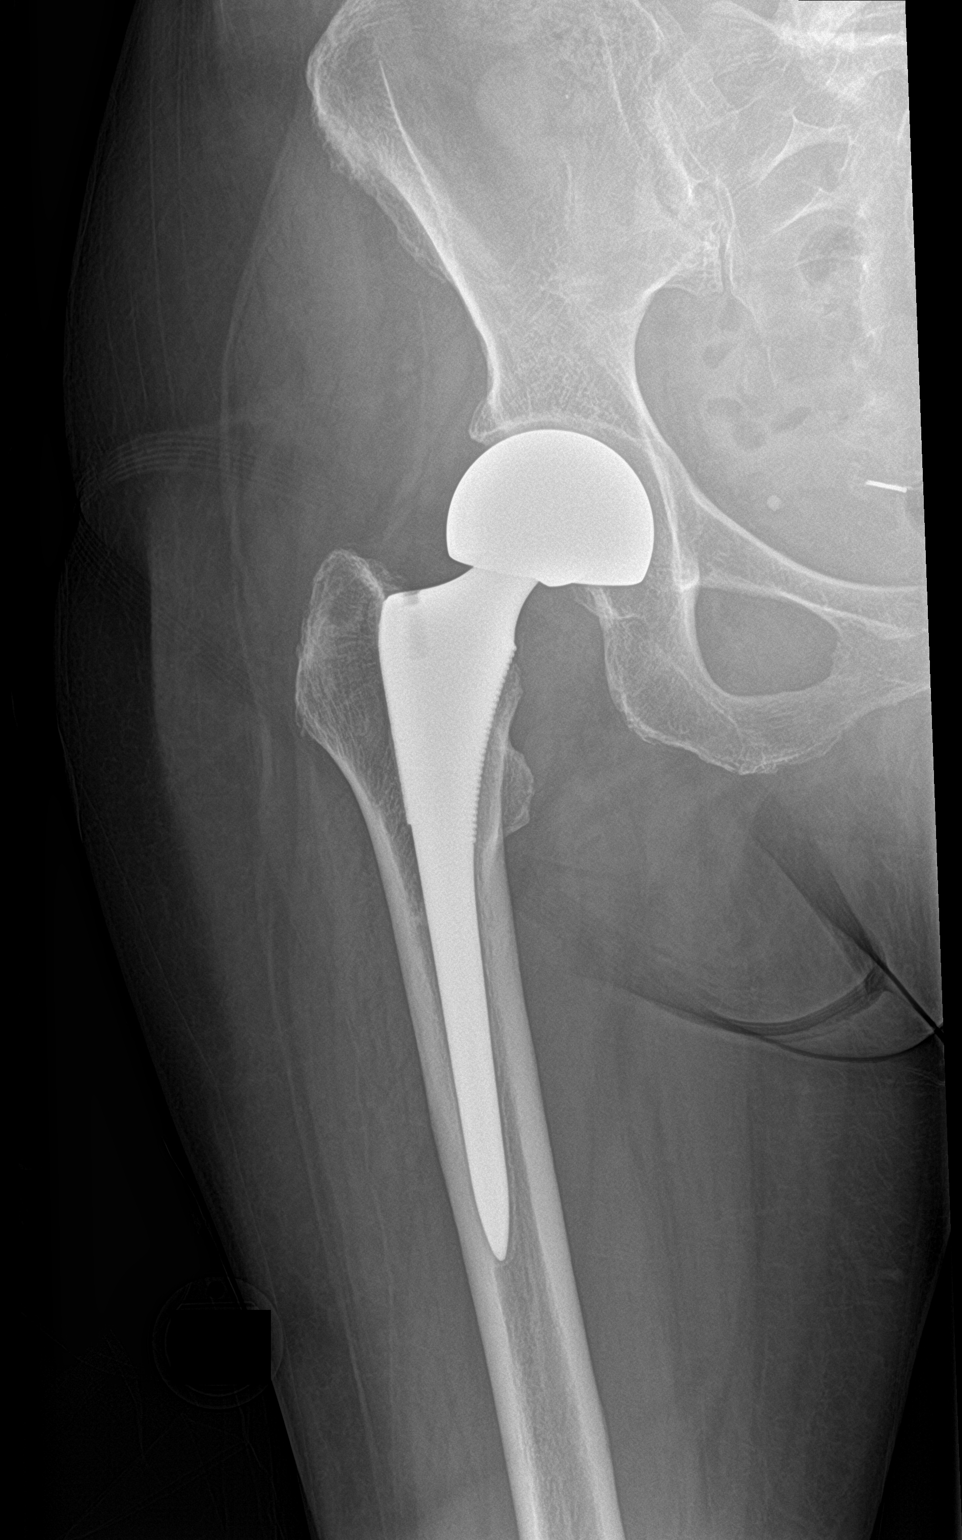

[3 of 3 positions shown; findings below may reference images not displayed]

FINDINGS: Right hip replacement in satisfactory position and alignment. There
is mild lucency around the femoral prosthesis which was not present
previously. Possible mild loosening.

Negative for fracture. Left hip joint negative. Degenerative change
in the lumbar spine left greater than right
IMPRESSION: Negative for fracture. Mild lucency around the femoral prosthesis on
the right

## 2022-06-11 MED ORDER — AMOXICILLIN-POT CLAVULANATE 875-125 MG PO TABS: 1.0000 | ORAL_TABLET | Freq: Two times a day (BID) | ORAL | 0 refills | Status: DC

## 2022-06-11 MED ORDER — FLUTICASONE PROPIONATE 50 MCG/ACT NA SUSP: 1.0000 | Freq: Two times a day (BID) | NASAL | 0 refills | Status: DC

## 2022-06-11 NOTE — ED Provider Notes (Signed)
[facility]    CSN: [id number] Arrival date & time: 06/11/22  1225      History   Chief Complaint No chief complaint on file.   HPI Christy Fletcher is a 81 y.o. female.   Patient presenting today with 2 to 3-week history of progressively worsening nasal congestion, sinus pain and pressure, sinus headaches, postnasal drip and sore throat.  Thinks she has had some low-grade fevers overnight the past couple of days, otherwise no fever, chills, body aches, chest pain, shortness of breath.  History of seasonal allergies on Claritin daily, tends to get sinus infections in the fall.  Otherwise not trying anything over-the-counter for symptoms.    Past Medical History:  Diagnosis Date   Arthritis    Diarrhea    Hypercholesteremia    Osteopenia     Patient Active Problem List   Diagnosis Date Noted   Cerebral embolism with cerebral infarction 11/23/2017   Osteopenia 11/19/2017   Arthritis 11/19/2017   Dyslipidemia 11/19/2017   Hip fracture ([place]) 11/19/2017   Decreased range of motion of right elbow 11/19/2013   Muscle weakness (generalized) 11/19/2013   Edema 11/19/2013   Pain in joint, forearm 11/19/2013    Past Surgical History:  Procedure Laterality Date   ABDOMINAL HYSTERECTOMY     CATARACT EXTRACTION     COLONOSCOPY     COLONOSCOPY N/A 05/14/2013   Procedure: COLONOSCOPY;  Surgeon: Danie Binder, MD;  [facility];  Service: Endoscopy;  Laterality: N/A;  10:30 AM-rescheduled to 8:45am Doris notified pt   HERNIA REPAIR     HIP ARTHROPLASTY Right 11/20/2017   Procedure: RIGHT HIP HEMIARTHROPLASTY;  Surgeon: Marybelle Killings, MD;  Location: [place];  Service: Orthopedics;  Laterality: Right;   LOOP RECORDER INSERTION N/A 11/24/2017   Procedure: LOOP RECORDER INSERTION;  Surgeon: Constance Haw, MD;  Location: [place] [facility];  Service: Cardiovascular;  Laterality: N/A;   TEE WITHOUT CARDIOVERSION N/A 11/24/2017   Procedure: TRANSESOPHAGEAL  ECHOCARDIOGRAM (TEE) WITH LOOP;  Surgeon: Larey Dresser, MD;  Location: [facility];  Service: Cardiovascular;  Laterality: N/A;    OB History   No obstetric history on file.      Home Medications    Prior to Admission medications   Medication Sig Start Date End Date Taking? Authorizing Provider  amoxicillin-clavulanate (AUGMENTIN) 875-125 MG tablet Take 1 tablet by mouth every 12 (twelve) hours. 06/11/22   Volney American, PA-C  aspirin EC 81 MG EC tablet Take 1 tablet (81 mg total) by mouth daily. 11/25/17   Hongalgi, Lenis Dickinson, MD  calcium carbonate (OS-CAL) 600 MG TABS tablet Take 1,200 mg by mouth daily.    [provider]  fluticasone (FLONASE) 50 MCG/ACT nasal spray Place 1 spray into both nostrils 2 (two) times daily. 06/11/22   Volney American, PA-C  lovastatin (MEVACOR) 20 MG tablet Take 20 mg by mouth at bedtime.    [provider]    Family History Family History  Problem Relation Age of Onset   Colon cancer Neg Hx     Social History Social History   Tobacco Use   Smoking status: Never   Smokeless tobacco: Never  Vaping Use   Vaping Use: Never used  Substance Use Topics   Alcohol use: No   Drug use: No     Allergies   Plavix [clopidogrel bisulfate]   Review of Systems Review of Systems Per HPI  Physical Exam Triage Vital Signs ED  Triage Vitals  Enc Vitals Group     BP 06/11/22 1239 126/75     Pulse Rate 06/11/22 1239 78     Resp 06/11/22 1239 18     Temp 06/11/22 1239 98.1 F (36.7 C)     Temp Source 06/11/22 1239 Oral     SpO2 06/11/22 1239 96 %     Weight --      Height --      Head Circumference --      Peak Flow --      Pain Score 06/11/22 1240 0     Pain Loc --      Pain Edu? --      Excl. in [place]? --    No data found.  Updated Vital Signs BP 126/75 (BP Location: Right Arm)   Pulse 78   Temp 98.1 F (36.7 C) (Oral)   Resp 18   SpO2 96%   Visual Acuity Right Eye Distance:   Left Eye Distance:    Bilateral Distance:    Right Eye Near:   Left Eye Near:    Bilateral Near:     Physical Exam Vitals and nursing note reviewed.  Constitutional:      Appearance: Normal appearance.  HENT:     Head: Atraumatic.     Right Ear: Tympanic membrane and external ear normal.     Left Ear: Tympanic membrane and external ear normal.     Nose: Congestion present.     Mouth/Throat:     Mouth: Mucous membranes are moist.     Pharynx: Posterior oropharyngeal erythema present.  Eyes:     Extraocular Movements: Extraocular movements intact.     Conjunctiva/sclera: Conjunctivae normal.  Cardiovascular:     Rate and Rhythm: Normal rate and regular rhythm.     Heart sounds: Normal heart sounds.  Pulmonary:     Effort: Pulmonary effort is normal.     Breath sounds: Normal breath sounds. No wheezing or rales.  Musculoskeletal:        General: Normal range of motion.     Cervical back: Normal range of motion and neck supple.  Skin:    General: Skin is warm and dry.  Neurological:     Mental Status: She is alert and oriented to person, place, and time.  Psychiatric:        Mood and Affect: Mood normal.        Thought Content: Thought content normal.      UC Treatments / Results  Labs (all labs ordered are listed, but only abnormal results are displayed) Labs Reviewed - No data to display  EKG   Radiology No results found.  Procedures Procedures (including critical care time)  Medications Ordered in UC Medications - No data to display  Initial Impression / Assessment and Plan / UC Course  I have reviewed the triage vital signs and the nursing notes.  Pertinent labs & imaging results that were available during my care of the patient were reviewed by me and considered in my medical decision making (see chart for details).     Given duration and worsening course, treat with Augmentin in addition to increasing allergy regimen to Flonase twice daily, sinus rinses as needed and  antihistamine daily.  Discussed safe over-the-counter supportive medications additionally.  Return for any worsening symptoms.  Final Clinical Impressions(s) / UC Diagnoses   Final diagnoses:  Acute recurrent maxillary sinusitis  Seasonal allergic rhinitis due to other allergic trigger  Discharge Instructions      You may use a sinus rinse kit, such as the NeilMed sinus rinse kit 1-2 times daily when you get symptoms such as this to help keep the sinuses cleaned out.  I recommend for a daily allergy regimen and antihistamine such as Zyrtec or Claritin and Flonase twice daily, pointed up and out toward the corners of your eyes when you spray.  You may use medications such as plain Mucinex, Coricidin HBP additionally for congestion or cough as needed.    ED Prescriptions     Medication Sig Dispense Auth. Provider   amoxicillin-clavulanate (AUGMENTIN) 875-125 MG tablet Take 1 tablet by mouth every 12 (twelve) hours. 14 tablet Volney American, PA-C   fluticasone [facility]) 50 MCG/ACT nasal spray Place 1 spray into both nostrils 2 (two) times daily. 16 g Volney American, [place]      PDMP not reviewed this encounter.   Volney American, [place] 06/11/22 1254

## 2022-06-11 NOTE — ED Triage Notes (Signed)
Cough, sore throat and yellow nasal congestion x 2 to 3 weeks  Has been taking allergy medication without relief.

## 2022-06-11 NOTE — Discharge Instructions (Signed)
You may use a sinus rinse kit, such as the NeilMed sinus rinse kit 1-2 times daily when you get symptoms such as this to help keep the sinuses cleaned out.  I recommend for a daily allergy regimen and antihistamine such as Zyrtec or Claritin and Flonase twice daily, pointed up and out toward the corners of your eyes when you spray.  You may use medications such as plain Mucinex, Coricidin HBP additionally for congestion or cough as needed.

## 2022-07-27 DIAGNOSIS — M79671 Pain in right foot: Secondary | ICD-10-CM | POA: Diagnosis not present

## 2022-07-27 DIAGNOSIS — M79672 Pain in left foot: Secondary | ICD-10-CM | POA: Diagnosis not present

## 2022-07-27 DIAGNOSIS — M79674 Pain in right toe(s): Secondary | ICD-10-CM | POA: Diagnosis not present

## 2022-07-27 DIAGNOSIS — L110 Acquired keratosis follicularis: Secondary | ICD-10-CM | POA: Diagnosis not present

## 2022-07-27 DIAGNOSIS — M79675 Pain in left toe(s): Secondary | ICD-10-CM | POA: Diagnosis not present

## 2022-07-27 DIAGNOSIS — I739 Peripheral vascular disease, unspecified: Secondary | ICD-10-CM | POA: Diagnosis not present

## 2022-08-24 DIAGNOSIS — Z23 Encounter for immunization: Secondary | ICD-10-CM | POA: Diagnosis not present

## 2022-10-28 ENCOUNTER — Encounter (HOSPITAL_COMMUNITY): Payer: Self-pay | Admitting: Family Medicine

## 2022-11-15 DIAGNOSIS — H31091 Other chorioretinal scars, right eye: Secondary | ICD-10-CM | POA: Diagnosis not present

## 2022-11-15 DIAGNOSIS — H43813 Vitreous degeneration, bilateral: Secondary | ICD-10-CM | POA: Diagnosis not present

## 2022-11-15 DIAGNOSIS — H02403 Unspecified ptosis of bilateral eyelids: Secondary | ICD-10-CM | POA: Diagnosis not present

## 2022-11-15 DIAGNOSIS — Z961 Presence of intraocular lens: Secondary | ICD-10-CM | POA: Diagnosis not present

## 2022-12-14 DIAGNOSIS — M2041 Other hammer toe(s) (acquired), right foot: Secondary | ICD-10-CM | POA: Diagnosis not present

## 2022-12-14 DIAGNOSIS — L110 Acquired keratosis follicularis: Secondary | ICD-10-CM | POA: Diagnosis not present

## 2022-12-14 DIAGNOSIS — M79674 Pain in right toe(s): Secondary | ICD-10-CM | POA: Diagnosis not present

## 2022-12-14 DIAGNOSIS — M79672 Pain in left foot: Secondary | ICD-10-CM | POA: Diagnosis not present

## 2022-12-14 DIAGNOSIS — M79671 Pain in right foot: Secondary | ICD-10-CM | POA: Diagnosis not present

## 2022-12-14 DIAGNOSIS — M79675 Pain in left toe(s): Secondary | ICD-10-CM | POA: Diagnosis not present

## 2022-12-14 DIAGNOSIS — M2042 Other hammer toe(s) (acquired), left foot: Secondary | ICD-10-CM | POA: Diagnosis not present

## 2022-12-14 DIAGNOSIS — I739 Peripheral vascular disease, unspecified: Secondary | ICD-10-CM | POA: Diagnosis not present

## 2022-12-20 ENCOUNTER — Other Ambulatory Visit (HOSPITAL_COMMUNITY): Payer: Self-pay | Admitting: Family Medicine

## 2022-12-20 DIAGNOSIS — Z1231 Encounter for screening mammogram for malignant neoplasm of breast: Secondary | ICD-10-CM

## 2022-12-27 ENCOUNTER — Ambulatory Visit (HOSPITAL_COMMUNITY)
Admission: RE | Admit: 2022-12-27 | Discharge: 2022-12-27 | Disposition: A | Discharge status: Home / Self Care | Payer: Medicare HMO | Source: Ambulatory Visit | Attending: Family Medicine | Admitting: Family Medicine

## 2022-12-27 ENCOUNTER — Encounter (HOSPITAL_COMMUNITY): Payer: Self-pay

## 2022-12-27 DIAGNOSIS — Z1231 Encounter for screening mammogram for malignant neoplasm of breast: Secondary | ICD-10-CM | POA: Insufficient documentation

## 2023-01-27 DIAGNOSIS — M79672 Pain in left foot: Secondary | ICD-10-CM | POA: Diagnosis not present

## 2023-01-27 DIAGNOSIS — M2042 Other hammer toe(s) (acquired), left foot: Secondary | ICD-10-CM | POA: Diagnosis not present

## 2023-01-27 DIAGNOSIS — M79674 Pain in right toe(s): Secondary | ICD-10-CM | POA: Diagnosis not present

## 2023-01-27 DIAGNOSIS — I739 Peripheral vascular disease, unspecified: Secondary | ICD-10-CM | POA: Diagnosis not present

## 2023-01-27 DIAGNOSIS — M2041 Other hammer toe(s) (acquired), right foot: Secondary | ICD-10-CM | POA: Diagnosis not present

## 2023-01-27 DIAGNOSIS — M79675 Pain in left toe(s): Secondary | ICD-10-CM | POA: Diagnosis not present

## 2023-01-27 DIAGNOSIS — L110 Acquired keratosis follicularis: Secondary | ICD-10-CM | POA: Diagnosis not present

## 2023-01-27 DIAGNOSIS — M79671 Pain in right foot: Secondary | ICD-10-CM | POA: Diagnosis not present

## 2023-02-22 DIAGNOSIS — L110 Acquired keratosis follicularis: Secondary | ICD-10-CM | POA: Diagnosis not present

## 2023-02-22 DIAGNOSIS — M79675 Pain in left toe(s): Secondary | ICD-10-CM | POA: Diagnosis not present

## 2023-02-22 DIAGNOSIS — M79671 Pain in right foot: Secondary | ICD-10-CM | POA: Diagnosis not present

## 2023-02-22 DIAGNOSIS — M79674 Pain in right toe(s): Secondary | ICD-10-CM | POA: Diagnosis not present

## 2023-02-22 DIAGNOSIS — M79672 Pain in left foot: Secondary | ICD-10-CM | POA: Diagnosis not present

## 2023-02-22 DIAGNOSIS — I739 Peripheral vascular disease, unspecified: Secondary | ICD-10-CM | POA: Diagnosis not present

## 2023-02-22 DIAGNOSIS — M2042 Other hammer toe(s) (acquired), left foot: Secondary | ICD-10-CM | POA: Diagnosis not present

## 2023-02-22 DIAGNOSIS — M2041 Other hammer toe(s) (acquired), right foot: Secondary | ICD-10-CM | POA: Diagnosis not present

## 2023-03-01 DIAGNOSIS — M19041 Primary osteoarthritis, right hand: Secondary | ICD-10-CM | POA: Diagnosis not present

## 2023-03-01 DIAGNOSIS — E7800 Pure hypercholesterolemia, unspecified: Secondary | ICD-10-CM | POA: Diagnosis not present

## 2023-03-01 DIAGNOSIS — M8580 Other specified disorders of bone density and structure, unspecified site: Secondary | ICD-10-CM | POA: Diagnosis not present

## 2023-03-01 DIAGNOSIS — M19042 Primary osteoarthritis, left hand: Secondary | ICD-10-CM | POA: Diagnosis not present

## 2023-03-02 DIAGNOSIS — E7800 Pure hypercholesterolemia, unspecified: Secondary | ICD-10-CM | POA: Diagnosis not present

## 2023-03-02 DIAGNOSIS — Z636 Dependent relative needing care at home: Secondary | ICD-10-CM | POA: Diagnosis not present

## 2023-03-02 DIAGNOSIS — I639 Cerebral infarction, unspecified: Secondary | ICD-10-CM | POA: Diagnosis not present

## 2023-03-02 DIAGNOSIS — E559 Vitamin D deficiency, unspecified: Secondary | ICD-10-CM | POA: Diagnosis not present

## 2023-03-02 DIAGNOSIS — M810 Age-related osteoporosis without current pathological fracture: Secondary | ICD-10-CM | POA: Diagnosis not present

## 2023-03-02 DIAGNOSIS — Z96641 Presence of right artificial hip joint: Secondary | ICD-10-CM | POA: Diagnosis not present

## 2023-03-03 LAB — LAB REPORT - SCANNED: EGFR: 88

## 2023-03-17 DIAGNOSIS — L89892 Pressure ulcer of other site, stage 2: Secondary | ICD-10-CM | POA: Diagnosis not present

## 2023-03-22 DIAGNOSIS — M25572 Pain in left ankle and joints of left foot: Secondary | ICD-10-CM | POA: Diagnosis not present

## 2023-03-22 DIAGNOSIS — Z0000 Encounter for general adult medical examination without abnormal findings: Secondary | ICD-10-CM | POA: Diagnosis not present

## 2023-03-23 ENCOUNTER — Other Ambulatory Visit (INDEPENDENT_AMBULATORY_CARE_PROVIDER_SITE_OTHER): Payer: Medicare HMO

## 2023-03-23 ENCOUNTER — Ambulatory Visit: Payer: Medicare HMO | Admitting: Family

## 2023-03-23 ENCOUNTER — Telehealth: Payer: Self-pay | Admitting: Family

## 2023-03-23 DIAGNOSIS — M79672 Pain in left foot: Secondary | ICD-10-CM

## 2023-03-23 MED ORDER — DOXYCYCLINE HYCLATE 100 MG PO TABS: 100.0000 mg | ORAL_TABLET | Freq: Two times a day (BID) | ORAL | 0 refills | Status: DC

## 2023-03-23 NOTE — Telephone Encounter (Signed)
Pt daughter called asking for refill on Ciprofloxacin as discussed in todays visit please advise

## 2023-03-23 NOTE — Telephone Encounter (Signed)
Doxycycline 14 d/s sent to pharmacy

## 2023-03-25 ENCOUNTER — Telehealth: Payer: Self-pay | Admitting: Family

## 2023-03-25 ENCOUNTER — Encounter: Payer: Self-pay | Admitting: Family

## 2023-03-25 DIAGNOSIS — H6123 Impacted cerumen, bilateral: Secondary | ICD-10-CM | POA: Diagnosis not present

## 2023-03-25 DIAGNOSIS — H903 Sensorineural hearing loss, bilateral: Secondary | ICD-10-CM | POA: Diagnosis not present

## 2023-03-25 MED ORDER — SULFAMETHOXAZOLE-TRIMETHOPRIM 800-160 MG PO TABS: 1.0000 | ORAL_TABLET | Freq: Two times a day (BID) | ORAL | 0 refills | Status: DC

## 2023-03-25 NOTE — Telephone Encounter (Signed)
Pt daughter called in stating they have tried Doxy previously and it did not help any please be advised, call Daughter French Ana at [phone]

## 2023-03-25 NOTE — Addendum Note (Signed)
Addended by: Barnie Del R on: 03/25/2023 10:48 AM   Modules accepted: Orders

## 2023-03-25 NOTE — Progress Notes (Signed)
Office Visit Note   Patient: Christy Fletcher           Date of Birth: 1940-10-04           MRN: [id number] Visit Date: 03/23/2023              Requested by: Gareth Morgan, MD [address] [place],  [place] [address] PCP: Gareth Morgan, MD  Chief Complaint  Patient presents with   Left Foot - Open Wound      HPI: The patient is an 82 year old woman who is seen today for initial evaluation of a left foot second toe ulcer which has been ongoing for greater than 2 months.  She follows with podiatry who initially placed her on a course of doxycycline.  She did not show any improvement and was next placed on a course of Cipro which she has been on for about a week without any improvement she has had increasing pain redness and swelling of the toe difficulty bearing weight due to pain in the second toe some drainage she has been doing dry dressing changes No history of diabetes  Assessment & Plan: Visit Diagnoses:  1. Pain in left foot     Plan: Discussed options with the patient and her daughter at length.  They are in agreement with the plan to proceed with a second toe amputation.  Will continue on oral antibiotics dry dressings until surgery  Follow-Up Instructions: Return Postop.   Ortho Exam  Patient is alert, oriented, no adenopathy, well-dressed, normal affect, normal respiratory effort. On examination of the left foot there is mild edema without erythema over the dorsum of the forefoot the second toe has ulceration over the IP joint in the second webspace.  This ulcer probes to bone there is undermining surrounding maceration  Biphasic dorsalis pedis pulse with the Doppler  Imaging: No results found. No images are attached to the encounter.  Labs: Lab Results  Component Value Date   HGBA1C 5.6 11/22/2017     No results found for: "ALBUMIN", "PREALBUMIN", "CBC"  No results found for: "MG" No results found for: "VD25OH"  No results found for:  "PREALBUMIN"    Latest Ref Rng & Units 05/27/2021   12:42 PM 11/23/2017    8:53 AM 11/22/2017    5:14 AM  CBC EXTENDED  WBC 4.0 - 10.5 K/uL 4.5  9.4  9.7   RBC 3.87 - 5.11 MIL/uL 4.32  3.77  3.77   Hemoglobin 12.0 - 15.0 g/dL 19.1  47.8  29.5   HCT 36.0 - 46.0 % 39.5  35.1  35.4   Platelets 150 - 400 K/uL 226  207  176      There is no height or weight on file to calculate BMI.  Orders:  Orders Placed This Encounter  Procedures   XR Foot 2 Views Left   Meds ordered this encounter  Medications   doxycycline (VIBRA-TABS) 100 MG tablet    Sig: Take 1 tablet (100 mg total) by mouth 2 (two) times daily for 14 days.    Dispense:  28 tablet    Refill:  0     Procedures: No procedures performed  Clinical Data: No additional findings.  ROS:  All other systems negative, except as noted in the HPI. Review of Systems  Objective: Vital Signs: There were no vitals taken for this visit.  Specialty Comments:  No specialty comments available.  PMFS History: Patient Active Problem List   Diagnosis Date Noted  Cerebral embolism with cerebral infarction 11/23/2017   Osteopenia 11/19/2017   Arthritis 11/19/2017   Dyslipidemia 11/19/2017   Hip fracture ([facility]) 11/19/2017   Decreased range of motion of right elbow 11/19/2013   Muscle weakness (generalized) 11/19/2013   Edema 11/19/2013   Pain in joint, forearm 11/19/2013   Past Medical History:  Diagnosis Date   Arthritis    Diarrhea    Hypercholesteremia    Osteopenia     Family History  Problem Relation Age of Onset   Colon cancer Neg Hx     Past Surgical History:  Procedure Laterality Date   ABDOMINAL HYSTERECTOMY     CATARACT EXTRACTION     COLONOSCOPY     COLONOSCOPY N/A 05/14/2013   Procedure: COLONOSCOPY;  Surgeon: West Bali, MD;  [facility];  Service: Endoscopy;  Laterality: N/A;  10:30 AM-rescheduled to 8:45am Doris notified pt   HERNIA REPAIR     HIP ARTHROPLASTY Right 11/20/2017   Procedure:  RIGHT HIP HEMIARTHROPLASTY;  Surgeon: Eldred Manges, MD;  Location: [facility];  Service: Orthopedics;  Laterality: Right;   LOOP RECORDER INSERTION N/A 11/24/2017   Procedure: LOOP RECORDER INSERTION;  Surgeon: Regan Lemming, MD;  Location: [facility];  Service: Cardiovascular;  Laterality: N/A;   TEE WITHOUT CARDIOVERSION N/A 11/24/2017   Procedure: TRANSESOPHAGEAL ECHOCARDIOGRAM (TEE) WITH LOOP;  Surgeon: Laurey Morale, MD;  Location: [facility] ENDOSCOPY;  Service: Cardiovascular;  Laterality: N/A;   Social History   Occupational History   Not on file  Tobacco Use   Smoking status: Never   Smokeless tobacco: Never  Vaping Use   Vaping Use: Never used  Substance and Sexual Activity   Alcohol use: No   Drug use: No   Sexual activity: Not on file

## 2023-03-26 ENCOUNTER — Other Ambulatory Visit: Payer: Self-pay

## 2023-03-26 ENCOUNTER — Emergency Department (HOSPITAL_COMMUNITY): Payer: Medicare HMO

## 2023-03-26 ENCOUNTER — Emergency Department (HOSPITAL_COMMUNITY)
Admission: EM | Admit: 2023-03-26 | Discharge: 2023-03-26 | Disposition: A | Discharge status: Home / Self Care | Payer: Medicare HMO | Attending: Emergency Medicine | Admitting: Emergency Medicine

## 2023-03-26 DIAGNOSIS — L089 Local infection of the skin and subcutaneous tissue, unspecified: Secondary | ICD-10-CM

## 2023-03-26 DIAGNOSIS — M7989 Other specified soft tissue disorders: Secondary | ICD-10-CM | POA: Diagnosis not present

## 2023-03-26 DIAGNOSIS — L03032 Cellulitis of left toe: Secondary | ICD-10-CM | POA: Diagnosis not present

## 2023-03-26 LAB — CBC
HCT: 40.5 % (ref 36.0–46.0)
Hemoglobin: 13.2 g/dL (ref 12.0–15.0)
MCH: 30.3 pg (ref 26.0–34.0)
MCHC: 32.6 g/dL (ref 30.0–36.0)
MCV: 92.9 fL (ref 80.0–100.0)
Platelets: 160 10*3/uL (ref 150–400)
RBC: 4.36 MIL/uL (ref 3.87–5.11)
RDW: 14.1 % (ref 11.5–15.5)
WBC: 9 10*3/uL (ref 4.0–10.5)
nRBC: 0 % (ref 0.0–0.2)

## 2023-03-26 LAB — BASIC METABOLIC PANEL
Anion gap: 7 (ref 5–15)
BUN: 9 mg/dL (ref 8–23)
CO2: 25 mmol/L (ref 22–32)
Calcium: 8.4 mg/dL — ABNORMAL LOW (ref 8.9–10.3)
Chloride: 104 mmol/L (ref 98–111)
Creatinine, Ser: 0.86 mg/dL (ref 0.44–1.00)
GFR, Estimated: >60 mL/min (ref >60)
Glucose, Bld: 87 mg/dL (ref 70–99)
Potassium: 3.6 mmol/L (ref 3.5–5.1)
Sodium: 136 mmol/L (ref 135–145)

## 2023-03-26 LAB — LACTIC ACID, PLASMA: Lactic Acid, Venous: 1.6 mmol/L (ref 0.5–1.9)

## 2023-03-26 MED ORDER — LACTATED RINGERS IV SOLN
INTRAVENOUS | Status: DC
Start: 2023-03-26 — End: 2023-03-26

## 2023-03-26 MED ORDER — SODIUM CHLORIDE 0.9 % IV SOLN
2.0000 g | Freq: Once | INTRAVENOUS | Status: AC
  Administered 2023-03-26: 2 g via INTRAVENOUS
  Filled 2023-03-26: qty 20

## 2023-03-26 MED ORDER — AMOXICILLIN-POT CLAVULANATE 875-125 MG PO TABS: 1.0000 | ORAL_TABLET | Freq: Two times a day (BID) | ORAL | 0 refills | Status: DC

## 2023-03-26 NOTE — Discharge Instructions (Signed)
Follow-up in the office on Monday.  Start the Augmentin antibiotic in addition to the Septra.  Return to the ED for fevers chills, increasing redness, swelling or other concerning symptoms

## 2023-03-26 NOTE — ED Provider Notes (Signed)
Christy Fletcher Provider Note   CSN: [id number] Arrival date & time: 03/26/23  1914     History  Chief Complaint  Patient presents with   Toe Pain   Wound Infection    Christy Fletcher is a 82 y.o. female.   Toe Pain     Patient has been having difficulty with a wound on her left second toe for over 2 months.  Patient has been seen by podiatry.  She was treated with antibiotics including doxycycline and Cipro.  Patient started to have increasing redness and swelling.  Patient was referred to orthopedics.  Plan was for amputation of the toe.  That is going to be performed by Dr. Lajoyce Corners.  Procedure scheduled for this coming week.  Christy Fletcher has noticed some increasing swelling around her toe.  They were concerned that it was spreading and wanted to make sure nothing needed to be done prior to the surgery  Home Medications Prior to Admission medications   Medication Sig Start Date End Date Taking? Authorizing Provider  amoxicillin-clavulanate (AUGMENTIN) 875-125 MG tablet Take 1 tablet by mouth every 12 (twelve) hours. 03/26/23  Yes Linwood Dibbles, MD  aspirin EC 81 MG EC tablet Take 1 tablet (81 mg total) by mouth daily. 11/25/17   Hongalgi, Maximino Greenland, MD  calcium carbonate (OS-CAL) 600 MG TABS tablet Take 1,200 mg by mouth daily.    [provider]  fluticasone (FLONASE) 50 MCG/ACT nasal spray Place 1 spray into both nostrils 2 (two) times daily. 06/11/22   Particia Nearing, PA-C  lovastatin (MEVACOR) 20 MG tablet Take 20 mg by mouth at bedtime.    [provider]  sulfamethoxazole-trimethoprim (BACTRIM DS) 800-160 MG tablet Take 1 tablet by mouth 2 (two) times daily. 03/25/23   Adonis Huguenin, NP      Allergies    Plavix [clopidogrel bisulfate]    Review of Systems   Review of Systems  Physical Exam Updated Vital Signs BP (!) 149/80 (BP Location: Right Arm)   Pulse 68   Temp 98 F (36.7 C) (Oral)   Resp 18   Ht 1.626 m (5'  4")   Wt 50.8 kg   SpO2 99%   BMI 19.22 kg/m  Physical Exam Vitals and nursing note reviewed.  Constitutional:      Appearance: She is well-developed. She is not ill-appearing or diaphoretic.  HENT:     Head: Normocephalic and atraumatic.     Right Ear: External ear normal.     Left Ear: External ear normal.  Eyes:     General: No scleral icterus.       Right eye: No discharge.        Left eye: No discharge.     Conjunctiva/sclera: Conjunctivae normal.  Neck:     Trachea: No tracheal deviation.  Cardiovascular:     Rate and Rhythm: Normal rate.  Pulmonary:     Effort: Pulmonary effort is normal. No respiratory distress.     Breath sounds: No stridor.  Abdominal:     General: There is no distension.  Musculoskeletal:        General: Swelling and tenderness present. No deformity.     Cervical back: Neck supple.     Comments: Ulceration noted on the medial aspect of the left second toe, surrounding erythema extending proximally, no lymphangitic streaking  Skin:    General: Skin is warm and dry.     Findings: No rash.  Neurological:  Mental Status: She is alert. Mental status is at baseline.     Cranial Nerves: No dysarthria or facial asymmetry.     Motor: No seizure activity.     ED Results / Procedures / Treatments   Labs (all labs ordered are listed, but only abnormal results are displayed) Labs Reviewed  BASIC METABOLIC PANEL - Abnormal; Notable for the following components:      Result Value   Calcium 8.4 (*)    All other components within normal limits  CULTURE, BLOOD (SINGLE)  CBC  LACTIC ACID, PLASMA    EKG None  Radiology DG Foot Complete Left  Result Date: 03/26/2023 CLINICAL DATA:  Toe infection EXAM: LEFT FOOT - COMPLETE 3+ VIEW COMPARISON:  03/23/2023 FINDINGS: No acute fracture or dislocation. Subtle erosion along the medial margin of the second toe proximal phalanx adjacent to the PIP joint. No additional sites of bony erosion. Soft tissue  swelling of the second toe. No soft tissue gas. IMPRESSION: Subtle erosion along the medial margin of the second toe proximal phalanx adjacent to the PIP joint. Findings are most compatible with acute osteomyelitis. Electronically Signed   By: Duanne Guess D.O.   On: 03/26/2023 11:08    Procedures Procedures    Medications Ordered in ED Medications  lactated ringers infusion ( Intravenous New Bag/Given 03/26/23 1057)  cefTRIAXone (ROCEPHIN) 2 g in sodium chloride 0.9 % 100 mL IVPB (0 g Intravenous Stopped 03/26/23 1054)    ED Course/ Medical Decision Making/ A&P Clinical Course as of 03/26/23 1421  Sat Mar 26, 2023  1220 CBC normal [JK]  1221 Bmet normal  [JK]  1221 Lactic acid, plasma Lactic acid normal [JK]  1221 X-rays shows a subtle erosion suggesting osteomyelitis [JK]    Clinical Course User Index [JK] Linwood Dibbles, MD                             Medical Decision Making Amount and/or Complexity of Data Reviewed Labs: ordered. Decision-making details documented in ED Course. Radiology: ordered.  Risk Prescription drug management.   Patient has a chronic toe wound.  Patient had x-rays on the third that showed a component of osteomyelitis.  Her x-ray today shows that similar finding.  There is surrounding erythema suggesting soft tissue infection as well.  Patient is otherwise afebrile.  She does not have a lactic acidosis.  She does not have a leukocytosis.  Discussed the case with Dr. Roda Shutters orthopedics to see if she was appropriate for continued outpatient management.  She does have plans for toe amputation later this week.  Patient is appropriate for continued outpatient management.  Will have her monitor for fever or other concerning symptoms.  I will add Augmentin to her regimen.  Plan to follow-up on Monday and family understands to return for worsening symptoms        Final Clinical Impression(s) / ED Diagnoses Final diagnoses:  Toe infection    Rx / DC  Orders ED Discharge Orders          Ordered    amoxicillin-clavulanate (AUGMENTIN) 875-125 MG tablet  Every 12 hours        03/26/23 1355              Linwood Dibbles, MD 03/26/23 1422

## 2023-03-26 NOTE — ED Triage Notes (Addendum)
Pt. Stated, I have an infected 2nd toe on the left and Im scheduled for a toe removal on next Friday. Ive been on antibiotics and soaking. They said its got down into the bone and needs to go. Its very sore and painful. The toe is red and swollen. Here to make sure its not progressing

## 2023-03-26 NOTE — Sepsis Progress Note (Signed)
[facility] is following sepsis protocol. 

## 2023-03-28 ENCOUNTER — Ambulatory Visit: Payer: Medicare HMO | Admitting: Orthopedic Surgery

## 2023-03-28 DIAGNOSIS — M869 Osteomyelitis, unspecified: Secondary | ICD-10-CM | POA: Diagnosis not present

## 2023-03-28 LAB — CULTURE, BLOOD (SINGLE): Special Requests: ADEQUATE

## 2023-03-29 ENCOUNTER — Encounter: Payer: Self-pay | Admitting: Orthopedic Surgery

## 2023-03-29 LAB — CULTURE, BLOOD (SINGLE)

## 2023-03-29 NOTE — Progress Notes (Signed)
Office Visit Note   Patient: Christy Fletcher           Date of Birth: 08-18-1941           MRN: [id number] Visit Date: 03/28/2023              Requested by: Gareth Morgan, MD [address] [place],  [place] [address] PCP: Gareth Morgan, MD  Chief Complaint  Patient presents with   Left Foot - Wound Check    2nd toe      HPI: Patient is an 82 year old woman who is seen for evaluation for ulceration left foot second toe.  Patient has been on antibiotics including Bactrim DS and Augmentin.  Assessment & Plan: Visit Diagnoses:  1. Osteomyelitis of second toe of left foot (HCC)     Plan: Will plan for a left foot second ray amputation.  Risk and benefits were discussed including risk of the wound not healing need for additional surgery.  Patient states she understands wished to proceed at this time.  Follow-Up Instructions: Return in about 2 weeks (around 04/11/2023).   Ortho Exam  Patient is alert, oriented, no adenopathy, well-dressed, normal affect, normal respiratory effort. Examination patient has a palpable dorsalis pedis pulse.  Review of the radiographs show destructive bony changes at the PIP joint of the second toe consistent with chronic osteomyelitis.  The ulcer probes to destructive bony changes second toe first webspace PIP joint.  Imaging: No results found. No images are attached to the encounter.  Labs: Lab Results  Component Value Date   HGBA1C 5.6 11/22/2017   REPTSTATUS PENDING 03/26/2023   CULT  03/26/2023    NO GROWTH 3 DAYS Performed at [facility], [address] [place], [place] [address]      No results found for: "ALBUMIN", "PREALBUMIN", "CBC"  No results found for: "MG" No results found for: "VD25OH"  No results found for: "PREALBUMIN"    Latest Ref Rng & Units 03/26/2023   11:18 AM 05/27/2021   12:42 PM 11/23/2017    8:53 AM  CBC EXTENDED  WBC 4.0 - 10.5 K/uL 9.0  4.5  9.4   RBC 3.87 - 5.11 MIL/uL 4.36  4.32  3.77    Hemoglobin 12.0 - 15.0 g/dL 82.9  56.2  13.0   HCT 36.0 - 46.0 % 40.5  39.5  35.1   Platelets 150 - 400 K/uL 160  226  207      There is no height or weight on file to calculate BMI.  Orders:  No orders of the defined types were placed in this encounter.  No orders of the defined types were placed in this encounter.    Procedures: No procedures performed  Clinical Data: No additional findings.  ROS:  All other systems negative, except as noted in the HPI. Review of Systems  Objective: Vital Signs: There were no vitals taken for this visit.  Specialty Comments:  No specialty comments available.  PMFS History: Patient Active Problem List   Diagnosis Date Noted   Cerebral embolism with cerebral infarction 11/23/2017   Osteopenia 11/19/2017   Arthritis 11/19/2017   Dyslipidemia 11/19/2017   Hip fracture (HCC) 11/19/2017   Decreased range of motion of right elbow 11/19/2013   Muscle weakness (generalized) 11/19/2013   Edema 11/19/2013   Pain in joint, forearm 11/19/2013   Past Medical History:  Diagnosis Date   Arthritis    Diarrhea    Hypercholesteremia    Osteopenia  Family History  Problem Relation Age of Onset   Colon cancer Neg Hx     Past Surgical History:  Procedure Laterality Date   ABDOMINAL HYSTERECTOMY     CATARACT EXTRACTION     COLONOSCOPY     COLONOSCOPY N/A 05/14/2013   Procedure: COLONOSCOPY;  Surgeon: West Bali, MD;  [facility];  Service: Endoscopy;  Laterality: N/A;  10:30 AM-rescheduled to 8:45am Doris notified pt   HERNIA REPAIR     HIP ARTHROPLASTY Right 11/20/2017   Procedure: RIGHT HIP HEMIARTHROPLASTY;  Surgeon: Eldred Manges, MD;  Location: [facility] OR;  Service: Orthopedics;  Laterality: Right;   LOOP RECORDER INSERTION N/A 11/24/2017   Procedure: LOOP RECORDER INSERTION;  Surgeon: Regan Lemming, MD;  Location: [facility] INVASIVE CV LAB;  Service: Cardiovascular;  Laterality: N/A;   TEE WITHOUT CARDIOVERSION N/A  11/24/2017   Procedure: TRANSESOPHAGEAL ECHOCARDIOGRAM (TEE) WITH LOOP;  Surgeon: Laurey Morale, MD;  Location: [facility] ENDOSCOPY;  Service: Cardiovascular;  Laterality: N/A;   Social History   Occupational History   Not on file  Tobacco Use   Smoking status: Never   Smokeless tobacco: Never  Vaping Use   Vaping Use: Never used  Substance and Sexual Activity   Alcohol use: No   Drug use: No   Sexual activity: Not on file

## 2023-03-30 LAB — CULTURE, BLOOD (SINGLE): Culture: NO GROWTH

## 2023-03-31 ENCOUNTER — Encounter (HOSPITAL_COMMUNITY): Payer: Self-pay | Admitting: Orthopedic Surgery

## 2023-03-31 ENCOUNTER — Other Ambulatory Visit: Payer: Self-pay

## 2023-03-31 LAB — CULTURE, BLOOD (SINGLE)

## 2023-03-31 NOTE — Progress Notes (Signed)
SDW CALL  Patient was given pre-op instructions over the phone. The opportunity was given for the patient to ask questions. No further questions asked. Patient verbalized understanding of instructions given.   PCP - Robinette Haines Cardiologist - none  PPM/ICD - loop recorder-non functioning;battery life has ended. Device Orders -  Rep Notified -   Chest x-ray -  EKG - DOS Stress Test - none ECHO - TTE 11/24/17 Cardiac Cath - none  Sleep Study - none CPAP - no  Blood Thinner Instructions:na Aspirin Instructions:na  ERAS Protcol -clear liquids until 11:30 PRE-SURGERY Ensure or G2- no  COVID TEST- na   Anesthesia review: yes-history of cryptogenic stroke 2019  Patient denies shortness of breath, fever, cough and chest pain over the phone call   Surgical Instructions    Your procedure is scheduled on July 12  Report to [facility] Main Entrance "A" at 12 P.M., then check in with the Admitting office.  Call this number if you have problems the morning of surgery:  [phone]    Remember:  Do not eat after midnight the night before your surgery  You may drink clear liquids until 11:30am the morning of your surgery.   Clear liquids allowed are: Water, Non-Citrus Juices (without pulp), Carbonated Beverages, Clear Tea, Black Coffee ONLY (NO MILK, CREAM OR POWDERED CREAMER of any kind), and Gatorade   Take these medicines the morning of surgery with A SIP OF WATER: Tylenol if needed  As of today, STOP taking any Aspirin (unless otherwise instructed by your surgeon) Aleve, Naproxen, Ibuprofen, Motrin, Advil, Goody's, BC's, all herbal medications, fish oil, and all vitamins.  [facility] is not responsible for any belongings or valuables. .   Do NOT Smoke (Tobacco/Vaping)  24 hours prior to your procedure  If you use a CPAP at night, you may bring your mask for your overnight stay.   Contacts, glasses, hearing aids, dentures or partials may not be worn into surgery,  please bring cases for these belongings   Patients discharged the day of surgery will not be allowed to drive home, and someone needs to stay with them for 24 hours.  Special instructions:    Oral Hygiene is also important to reduce your risk of infection.  Remember - BRUSH YOUR TEETH THE MORNING OF SURGERY WITH YOUR REGULAR TOOTHPASTE   Day of Surgery:  Take a shower the day of or night before with antibacterial soap. Wear Clean/Comfortable clothing the morning of surgery Do not apply any deodorants/lotions.   Do not wear jewelry or makeup Do not wear lotions, powders, perfumes/colognes, or deodorant. Do not shave 48 hours prior to surgery.  Men may shave face and neck. Do not bring valuables to the hospital. Do not wear nail polish, gel polish, artificial nails, or any other type of covering on natural nails (fingers and toes) If you have artificial nails or gel coating that need to be removed by a nail salon, please have this removed prior to surgery. Artificial nails or gel coating may interfere with anesthesia's ability to adequately monitor your vital signs. Remember to brush your teeth WITH YOUR REGULAR TOOTHPASTE.

## 2023-04-01 ENCOUNTER — Encounter (HOSPITAL_COMMUNITY): Payer: Self-pay | Admitting: Orthopedic Surgery

## 2023-04-01 ENCOUNTER — Ambulatory Visit (HOSPITAL_COMMUNITY): Payer: Medicare HMO | Admitting: Anesthesiology

## 2023-04-01 ENCOUNTER — Ambulatory Visit (HOSPITAL_COMMUNITY)
Admission: RE | Admit: 2023-04-01 | Discharge: 2023-04-01 | Disposition: A | Discharge status: Home / Self Care | Payer: Medicare HMO | Attending: Orthopedic Surgery | Admitting: Orthopedic Surgery

## 2023-04-01 ENCOUNTER — Encounter (HOSPITAL_COMMUNITY)
Admission: RE | Disposition: A | Discharge status: Home / Self Care | Payer: Self-pay | Source: Home / Self Care | Attending: Orthopedic Surgery

## 2023-04-01 ENCOUNTER — Other Ambulatory Visit: Payer: Self-pay

## 2023-04-01 DIAGNOSIS — M1990 Unspecified osteoarthritis, unspecified site: Secondary | ICD-10-CM | POA: Insufficient documentation

## 2023-04-01 DIAGNOSIS — E7800 Pure hypercholesterolemia, unspecified: Secondary | ICD-10-CM | POA: Insufficient documentation

## 2023-04-01 DIAGNOSIS — E785 Hyperlipidemia, unspecified: Secondary | ICD-10-CM | POA: Diagnosis not present

## 2023-04-01 DIAGNOSIS — M86172 Other acute osteomyelitis, left ankle and foot: Secondary | ICD-10-CM | POA: Diagnosis not present

## 2023-04-01 DIAGNOSIS — I639 Cerebral infarction, unspecified: Secondary | ICD-10-CM | POA: Diagnosis not present

## 2023-04-01 DIAGNOSIS — M868X7 Other osteomyelitis, ankle and foot: Secondary | ICD-10-CM

## 2023-04-01 DIAGNOSIS — M8580 Other specified disorders of bone density and structure, unspecified site: Secondary | ICD-10-CM | POA: Diagnosis not present

## 2023-04-01 DIAGNOSIS — M869 Osteomyelitis, unspecified: Secondary | ICD-10-CM | POA: Diagnosis not present

## 2023-04-01 HISTORY — DX: Cerebral infarction, unspecified: I63.9

## 2023-04-01 HISTORY — PX: AMPUTATION: SHX166

## 2023-04-01 SURGERY — AMPUTATION DIGIT
Anesthesia: Monitor Anesthesia Care | Site: Second Toe | Laterality: Left

## 2023-04-01 MED ORDER — PROPOFOL 10 MG/ML IV BOLUS
INTRAVENOUS | Status: DC | PRN
Start: 2023-04-01 — End: 2023-04-01
  Administered 2023-04-01 (×4): 10 mg via INTRAVENOUS

## 2023-04-01 MED ORDER — BUPIVACAINE-EPINEPHRINE 0.5% -1:200000 IJ SOLN
INTRAMUSCULAR | Status: DC | PRN
  Administered 2023-04-01: 10 mL

## 2023-04-01 MED ORDER — ONDANSETRON HCL 4 MG/2ML IJ SOLN
INTRAMUSCULAR | Status: DC | PRN
  Administered 2023-04-01: 4 mg via INTRAVENOUS

## 2023-04-01 MED ORDER — 0.9 % SODIUM CHLORIDE (POUR BTL) OPTIME
TOPICAL | Status: DC | PRN
  Administered 2023-04-01: 1000 mL

## 2023-04-01 MED ORDER — ORAL CARE MOUTH RINSE: 15.0000 mL | Freq: Once | OROMUCOSAL | Status: AC

## 2023-04-01 MED ORDER — HYDROCODONE-ACETAMINOPHEN 5-325 MG PO TABS: 1.0000 | ORAL_TABLET | ORAL | 0 refills | Status: DC | PRN

## 2023-04-01 MED ORDER — CEFAZOLIN SODIUM-DEXTROSE 2-4 GM/100ML-% IV SOLN
2.0000 g | INTRAVENOUS | Status: AC
  Administered 2023-04-01: 2 g via INTRAVENOUS
  Filled 2023-04-01: qty 100

## 2023-04-01 MED ORDER — CHLORHEXIDINE GLUCONATE 0.12 % MT SOLN
OROMUCOSAL | Status: AC
  Filled 2023-04-01: qty 15

## 2023-04-01 MED ORDER — FENTANYL CITRATE (PF) 250 MCG/5ML IJ SOLN
INTRAMUSCULAR | Status: AC
  Filled 2023-04-01: qty 5

## 2023-04-01 MED ORDER — PROPOFOL 500 MG/50ML IV EMUL
INTRAVENOUS | Status: DC | PRN
  Administered 2023-04-01: 20 ug/kg/min via INTRAVENOUS

## 2023-04-01 MED ORDER — FENTANYL CITRATE (PF) 250 MCG/5ML IJ SOLN
INTRAMUSCULAR | Status: DC | PRN
  Administered 2023-04-01: 25 ug via INTRAVENOUS

## 2023-04-01 MED ORDER — FENTANYL CITRATE (PF) 100 MCG/2ML IJ SOLN: 25.0000 ug | INTRAMUSCULAR | Status: DC | PRN

## 2023-04-01 MED ORDER — ACETAMINOPHEN 500 MG PO TABS
1000.0000 mg | ORAL_TABLET | Freq: Once | ORAL | Status: AC
  Administered 2023-04-01: 1000 mg via ORAL
  Filled 2023-04-01: qty 2

## 2023-04-01 MED ORDER — OXYCODONE HCL 5 MG PO TABS: 5.0000 mg | ORAL_TABLET | Freq: Once | ORAL | Status: DC | PRN

## 2023-04-01 MED ORDER — ONDANSETRON HCL 4 MG/2ML IJ SOLN: 4.0000 mg | Freq: Once | INTRAMUSCULAR | Status: DC | PRN

## 2023-04-01 MED ORDER — CHLORHEXIDINE GLUCONATE 0.12 % MT SOLN
15.0000 mL | Freq: Once | OROMUCOSAL | Status: AC
  Administered 2023-04-01: 15 mL via OROMUCOSAL

## 2023-04-01 MED ORDER — LACTATED RINGERS IV SOLN: INTRAVENOUS | Status: DC

## 2023-04-01 MED ORDER — BUPIVACAINE HCL (PF) 0.5 % IJ SOLN
INTRAMUSCULAR | Status: AC
  Filled 2023-04-01: qty 30

## 2023-04-01 MED ORDER — OXYCODONE HCL 5 MG/5ML PO SOLN: 5.0000 mg | Freq: Once | ORAL | Status: DC | PRN

## 2023-04-01 SURGICAL SUPPLY — 32 items
BAG COUNTER SPONGE SURGICOUNT (BAG) ×1 IMPLANT
BAG SPNG CNTER NS LX DISP (BAG) ×1
BLADE SURG 21 STRL SS (BLADE) ×1 IMPLANT
BNDG CMPR 9X4 STRL LF SNTH (GAUZE/BANDAGES/DRESSINGS)
BNDG COHESIVE 4X5 TAN STRL (GAUZE/BANDAGES/DRESSINGS) ×1 IMPLANT
BNDG COHESIVE 6X5 TAN NS LF (GAUZE/BANDAGES/DRESSINGS) IMPLANT
BNDG ESMARK 4X9 LF (GAUZE/BANDAGES/DRESSINGS) IMPLANT
BNDG GAUZE DERMACEA FLUFF 4 (GAUZE/BANDAGES/DRESSINGS) ×1 IMPLANT
BNDG GZE DERMACEA 4 6PLY (GAUZE/BANDAGES/DRESSINGS) ×1
COVER SURGICAL LIGHT HANDLE (MISCELLANEOUS) ×2 IMPLANT
DRAPE U-SHAPE 47X51 STRL (DRAPES) ×1 IMPLANT
DRSG ADAPTIC 3X8 NADH LF (GAUZE/BANDAGES/DRESSINGS) IMPLANT
DURAPREP 26ML APPLICATOR (WOUND CARE) ×1 IMPLANT
ELECT REM PT RETURN 9FT ADLT (ELECTROSURGICAL) ×1
ELECTRODE REM PT RTRN 9FT ADLT (ELECTROSURGICAL) ×1 IMPLANT
GAUZE PAD ABD 8X10 STRL (GAUZE/BANDAGES/DRESSINGS) ×1 IMPLANT
GAUZE SPONGE 4X4 12PLY STRL (GAUZE/BANDAGES/DRESSINGS) IMPLANT
GLOVE BIOGEL PI IND STRL 9 (GLOVE) ×1 IMPLANT
GLOVE SURG ORTHO 9.0 STRL STRW (GLOVE) ×1 IMPLANT
GOWN STRL REUS W/ TWL XL LVL3 (GOWN DISPOSABLE) ×2 IMPLANT
GOWN STRL REUS W/TWL XL LVL3 (GOWN DISPOSABLE) ×2
KIT BASIN OR (CUSTOM PROCEDURE TRAY) ×1 IMPLANT
KIT TURNOVER KIT B (KITS) ×1 IMPLANT
MANIFOLD NEPTUNE II (INSTRUMENTS) ×1 IMPLANT
NDL 22X1.5 STRL (OR ONLY) (MISCELLANEOUS) IMPLANT
NEEDLE 22X1.5 STRL (OR ONLY) (MISCELLANEOUS) IMPLANT
NS IRRIG 1000ML POUR BTL (IV SOLUTION) ×1 IMPLANT
PACK ORTHO EXTREMITY (CUSTOM PROCEDURE TRAY) ×1 IMPLANT
PAD ARMBOARD 7.5X6 YLW CONV (MISCELLANEOUS) ×2 IMPLANT
SUT ETHILON 2 0 PSLX (SUTURE) ×1 IMPLANT
SYR CONTROL 10ML LL (SYRINGE) IMPLANT
TOWEL GREEN STERILE (TOWEL DISPOSABLE) ×1 IMPLANT

## 2023-04-01 NOTE — Anesthesia Preprocedure Evaluation (Addendum)
Anesthesia Evaluation  Patient identified by MRN, date of birth, ID band Patient awake    Reviewed: Allergy & Precautions, NPO status , Patient's Chart, lab work & pertinent test results  History of Anesthesia Complications Negative for: history of anesthetic complications  Airway Mallampati: II  TM Distance: >3 FB Neck ROM: Full    Dental  (+) Dental Advisory Given, Partial Upper, Partial Lower   Pulmonary neg pulmonary ROS   Pulmonary exam normal        Cardiovascular negative cardio ROS Normal cardiovascular exam     Neuro/Psych CVA, No Residual Symptoms  negative psych ROS   GI/Hepatic negative GI ROS, Neg liver ROS,,,  Endo/Other  negative endocrine ROS    Renal/GU negative Renal ROS     Musculoskeletal  (+) Arthritis ,    Abdominal   Peds  Hematology negative hematology ROS (+)   Anesthesia Other Findings   Reproductive/Obstetrics                             Anesthesia Physical Anesthesia Plan  ASA: 3  Anesthesia Plan: MAC   Post-op Pain Management: Tylenol PO (pre-op)*   Induction:   PONV Risk Score and Plan: 2 and Propofol infusion and Treatment may vary due to age or medical condition  Airway Management Planned: Natural Airway and Simple Face Mask  Additional Equipment: None  Intra-op Plan:   Post-operative Plan:   Informed Consent: I have reviewed the patients History and Physical, chart, labs and discussed the procedure including the risks, benefits and alternatives for the proposed anesthesia with the patient or authorized representative who has indicated his/her understanding and acceptance.     Dental advisory given  Plan Discussed with: CRNA and Anesthesiologist  Anesthesia Plan Comments:        Anesthesia Quick Evaluation

## 2023-04-01 NOTE — Transfer of Care (Signed)
Immediate Anesthesia Transfer of Care Note  Patient: Christy Fletcher  Procedure(s) Performed: LEFT 2ND TOE AMPUTATION (Left: Second Toe)  Patient Location: PACU  Anesthesia Type:MAC  Level of Consciousness: awake  Airway & Oxygen Therapy: Patient Spontanous Breathing and Patient connected to face mask oxygen  Post-op Assessment: Report given to RN and Post -op Vital signs reviewed and stable  Post vital signs: Reviewed and stable  Last Vitals:  Vitals Value Taken Time  BP 94/58   Temp    Pulse 72 04/01/23 1246  Resp 14 04/01/23 1246  SpO2 96 % 04/01/23 1246  Vitals shown include unfiled device data.  Last Pain:  Vitals:   04/01/23 1118  TempSrc:   PainSc: 0-No pain         Complications: No notable events documented.

## 2023-04-01 NOTE — Anesthesia Postprocedure Evaluation (Signed)
Anesthesia Post Note  Patient: Christy Fletcher  Procedure(s) Performed: LEFT 2ND TOE AMPUTATION (Left: Second Toe)     Patient location during evaluation: PACU Anesthesia Type: MAC Level of consciousness: awake and alert Pain management: pain level controlled Vital Signs Assessment: post-procedure vital signs reviewed and stable Respiratory status: spontaneous breathing, nonlabored ventilation and respiratory function stable Cardiovascular status: stable and blood pressure returned to baseline Anesthetic complications: no   No notable events documented.  Last Vitals:  Vitals:   04/01/23 1245 04/01/23 1300  BP: (!) 94/58 107/65  Pulse: 67 65  Resp: 12 12  Temp: (!) 36.3 C   SpO2: 99% 94%    Last Pain:  Vitals:   04/01/23 1245  TempSrc:   PainSc: 0-No pain                 Beryle Lathe

## 2023-04-01 NOTE — Interval H&P Note (Signed)
History and Physical Interval Note:  04/01/2023 12:12 PM  Christy Fletcher  has presented today for surgery, with the diagnosis of Osteomyelitis Left 2nd Toe.  The various methods of treatment have been discussed with the patient and family. After consideration of risks, benefits and other options for treatment, the patient has consented to  Procedure(s): LEFT 2ND TOE AMPUTATION (Left) as a surgical intervention.  The patient's history has been reviewed, patient examined, no change in status, stable for surgery.  I have reviewed the patient's chart and labs.  Questions were answered to the patient's satisfaction.     Christy Fletcher

## 2023-04-01 NOTE — H&P (Signed)
KATELYNE FETTERMAN is an 82 y.o. female.   Chief Complaint: Pain swelling and redness second toe left foot. HPI: Patient is an 82 year old woman who is seen for evaluation for ulceration left foot second toe. Patient has been on antibiotics including Bactrim DS and Augmentin.   Past Medical History:  Diagnosis Date   Arthritis    Diarrhea    Hypercholesteremia    Osteopenia    Stroke [facility])     Past Surgical History:  Procedure Laterality Date   ABDOMINAL HYSTERECTOMY     CATARACT EXTRACTION     COLONOSCOPY     COLONOSCOPY N/A 05/14/2013   Procedure: COLONOSCOPY;  Surgeon: West Bali, MD;  [facility];  Service: Endoscopy;  Laterality: N/A;  10:30 AM-rescheduled to 8:45am Doris notified pt   HERNIA REPAIR     HIP ARTHROPLASTY Right 11/20/2017   Procedure: RIGHT HIP HEMIARTHROPLASTY;  Surgeon: Eldred Manges, MD;  Location: [facility] OR;  Service: Orthopedics;  Laterality: Right;   LOOP RECORDER INSERTION N/A 11/24/2017   Procedure: LOOP RECORDER INSERTION;  Surgeon: Regan Lemming, MD;  Location: [facility] INVASIVE CV LAB;  Service: Cardiovascular;  Laterality: N/A;   TEE WITHOUT CARDIOVERSION N/A 11/24/2017   Procedure: TRANSESOPHAGEAL ECHOCARDIOGRAM (TEE) WITH LOOP;  Surgeon: Laurey Morale, MD;  Location: [facility] ENDOSCOPY;  Service: Cardiovascular;  Laterality: N/A;    Family History  Problem Relation Age of Onset   Colon cancer Neg Hx    Social History:  reports that she has never smoked. She has never used smokeless tobacco. She reports that she does not drink alcohol and does not use drugs.  Allergies:  Allergies  Allergen Reactions   Plavix [Clopidogrel Bisulfate]     Unknown reaction    No medications prior to admission.    No results found for this or any previous visit (from the past 48 hour(s)). No results found.  Review of Systems  All other systems reviewed and are negative.   There were no vitals taken for this visit. Physical Exam  Patient is alert,  oriented, no adenopathy, well-dressed, normal affect, normal respiratory effort. Examination patient has a palpable dorsalis pedis pulse.  Review of the radiographs show destructive bony changes at the PIP joint of the second toe consistent with chronic osteomyelitis.  The ulcer probes to destructive bony changes second toe first webspace PIP joint. Assessment/Plan . Osteomyelitis of second toe of left foot (HCC)       Plan: Will plan for a left foot second ray amputation.  Risk and benefits were discussed including risk of the wound not healing need for additional surgery.  Patient states she understands wished to proceed at this time.  Nadara Mustard, MD 04/01/2023, 6:44 AM

## 2023-04-01 NOTE — Op Note (Signed)
04/01/2023  12:46 PM  PATIENT:  Christy Fletcher    PRE-OPERATIVE DIAGNOSIS:  Osteomyelitis Left 2nd Toe  POST-OPERATIVE DIAGNOSIS:  Same  PROCEDURE:  LEFT 2ND RAY AMPUTATION  SURGEON:  Nadara Mustard, MD  PHYSICIAN ASSISTANT: April Green ANESTHESIA:   General  PREOPERATIVE INDICATIONS:  [name] is a  82 y.o. female with a diagnosis of Osteomyelitis Left 2nd Toe who failed conservative measures and elected for surgical management.    The risks benefits and alternatives were discussed with the patient preoperatively including but not limited to the risks of infection, bleeding, nerve injury, cardiopulmonary complications, the need for revision surgery, among others, and the patient was willing to proceed.  OPERATIVE IMPLANTS:   * No implants in log *  @ENCIMAGES @  OPERATIVE FINDINGS: Tissue margins were clear good petechial bleeding.  OPERATIVE PROCEDURE: Patient was brought the operating room underwent a MAC anesthetic.  After adequate levels anesthesia were obtained patient's left lower extremity was prepped using DuraPrep draped into a sterile field a timeout was called.  Patient underwent a local block with 10 cc of half percent Marcaine plain.  After adequate levels anesthesia were obtained a V incision was made around the second ray.  The metatarsal was resected through the shaft and 1 block of tissue.  The wound was irrigated with normal saline electrocautery was used hemostasis the incision closed using 2-0 nylon.  A sterile dressing was applied patient was taken the PACU in stable condition.   DISCHARGE PLANNING:  Antibiotic duration: Preoperative antibiotics  Weightbearing: Weightbearing as needed on her heel  Pain medication: Prescription for Vicodin  Dressing care/ Wound VAC: Dry dressing she may remove in 3 days to shower  Ambulatory devices: Patient has a walker  Discharge to: Home.  Follow-up: In the office 1 week post operative.

## 2023-04-02 ENCOUNTER — Encounter (HOSPITAL_COMMUNITY): Payer: Self-pay | Admitting: Orthopedic Surgery

## 2023-04-04 LAB — SURGICAL PATHOLOGY

## 2023-04-15 ENCOUNTER — Ambulatory Visit: Payer: Medicare HMO | Admitting: Family

## 2023-04-15 ENCOUNTER — Encounter: Payer: Self-pay | Admitting: Family

## 2023-04-15 DIAGNOSIS — M869 Osteomyelitis, unspecified: Secondary | ICD-10-CM

## 2023-04-15 NOTE — Progress Notes (Signed)
   Post-Op Visit Note   Patient: Christy Fletcher           Date of Birth: 1941/06/19           MRN: [id number] Visit Date: 04/15/2023 PCP: Gareth Morgan, MD  Chief Complaint:  Chief Complaint  Patient presents with   Left Foot - Routine Post Op    04/01/23 left 2nd toe amputation    HPI:  HPI The patient is an 82 year old woman who is seen status post left second toe amputation she has been doing dry dressing changes daily she has no concerns. Ortho Exam On examination of the left foot her incision is healing well there are sutures in place there is no gaping or drainage this has not yet fully healed no erythema  Visit Diagnoses: No diagnosis found.  Plan: Will hold off on suture removal for 1 more week.  Continue daily dose of cleansing.  Dry dressings.  May bear weight in her postop shoe  Follow-Up Instructions: No follow-ups on file.   Imaging: No results found.  Orders:  No orders of the defined types were placed in this encounter.  No orders of the defined types were placed in this encounter.    PMFS History: Patient Active Problem List   Diagnosis Date Noted   Osteomyelitis of second toe of left foot (HCC) 04/01/2023   Cerebral embolism with cerebral infarction 11/23/2017   Osteopenia 11/19/2017   Arthritis 11/19/2017   Dyslipidemia 11/19/2017   Hip fracture (HCC) 11/19/2017   Decreased range of motion of right elbow 11/19/2013   Muscle weakness (generalized) 11/19/2013   Edema 11/19/2013   Pain in joint, forearm 11/19/2013   Past Medical History:  Diagnosis Date   Arthritis    Diarrhea    Hypercholesteremia    Osteopenia    Stroke [facility])     Family History  Problem Relation Age of Onset   Colon cancer Neg Hx     Past Surgical History:  Procedure Laterality Date   ABDOMINAL HYSTERECTOMY     AMPUTATION Left 04/01/2023   Procedure: LEFT 2ND TOE AMPUTATION;  Surgeon: Nadara Mustard, MD;  Location: [facility] OR;  Service: Orthopedics;  Laterality: Left;    CATARACT EXTRACTION     COLONOSCOPY     COLONOSCOPY N/A 05/14/2013   Procedure: COLONOSCOPY;  Surgeon: West Bali, MD;  Location: [facility];  Service: Endoscopy;  Laterality: N/A;  10:30 AM-rescheduled to 8:45am Doris notified pt   HERNIA REPAIR     HIP ARTHROPLASTY Right 11/20/2017   Procedure: RIGHT HIP HEMIARTHROPLASTY;  Surgeon: Eldred Manges, MD;  Location: [facility] OR;  Service: Orthopedics;  Laterality: Right;   LOOP RECORDER INSERTION N/A 11/24/2017   Procedure: LOOP RECORDER INSERTION;  Surgeon: Regan Lemming, MD;  Location: [facility] INVASIVE CV LAB;  Service: Cardiovascular;  Laterality: N/A;   TEE WITHOUT CARDIOVERSION N/A 11/24/2017   Procedure: TRANSESOPHAGEAL ECHOCARDIOGRAM (TEE) WITH LOOP;  Surgeon: Laurey Morale, MD;  Location: [facility] ENDOSCOPY;  Service: Cardiovascular;  Laterality: N/A;   Social History   Occupational History   Not on file  Tobacco Use   Smoking status: Never   Smokeless tobacco: Never  Vaping Use   Vaping status: Never Used  Substance and Sexual Activity   Alcohol use: No   Drug use: No   Sexual activity: Not on file

## 2023-04-21 ENCOUNTER — Ambulatory Visit (INDEPENDENT_AMBULATORY_CARE_PROVIDER_SITE_OTHER): Payer: Medicare HMO | Admitting: Orthopedic Surgery

## 2023-04-21 DIAGNOSIS — M869 Osteomyelitis, unspecified: Secondary | ICD-10-CM | POA: Diagnosis not present

## 2023-04-22 ENCOUNTER — Ambulatory Visit: Payer: Medicare HMO

## 2023-04-27 ENCOUNTER — Telehealth: Payer: Self-pay | Admitting: Orthopedic Surgery

## 2023-04-27 ENCOUNTER — Other Ambulatory Visit: Payer: Self-pay

## 2023-04-27 NOTE — Telephone Encounter (Signed)
Pt is s/p a left 2nd toe amputation on 04/01/2023. She does not need abx coverage prior to dental appt. Letter faxed and daughter notified. To call with any questions.

## 2023-04-27 NOTE — Telephone Encounter (Signed)
Patient's daughter Kennith Center called advised patient need a note stating she do not need an antibiotic to see her dentist for a dental procedure.  The Dentist is Dr. Tenny Craw   Fax# [phone]   Ph# is [phone]   The number to contact Kennith Center is [phone] or [phone] Cell

## 2023-05-03 ENCOUNTER — Telehealth: Payer: Self-pay | Admitting: Orthopedic Surgery

## 2023-05-03 NOTE — Telephone Encounter (Signed)
I called pt and advised that she does not need ABX coverage and that I faxed a letter to Dr. Charlott Rakes office last Thursday but I would resend again today to the number that was given [phone] to call with any other questions.

## 2023-05-03 NOTE — Telephone Encounter (Signed)
Patient called and wanted to know due to her having sugery can she get dental work done. If so the fax number is [phone]. The doctor needed it in writing. [phone]

## 2023-05-05 ENCOUNTER — Encounter: Payer: Self-pay | Admitting: Orthopedic Surgery

## 2023-05-05 ENCOUNTER — Ambulatory Visit (INDEPENDENT_AMBULATORY_CARE_PROVIDER_SITE_OTHER): Payer: Medicare HMO | Admitting: Internal Medicine

## 2023-05-05 ENCOUNTER — Encounter: Payer: Self-pay | Admitting: Internal Medicine

## 2023-05-05 VITALS — BP 128/74 | HR 74 | Ht 61.5 in | Wt 113.0 lb

## 2023-05-05 DIAGNOSIS — E785 Hyperlipidemia, unspecified: Secondary | ICD-10-CM | POA: Diagnosis not present

## 2023-05-05 DIAGNOSIS — M869 Osteomyelitis, unspecified: Secondary | ICD-10-CM | POA: Diagnosis not present

## 2023-05-05 DIAGNOSIS — I6310 Cerebral infarction due to embolism of unspecified precerebral artery: Secondary | ICD-10-CM | POA: Diagnosis not present

## 2023-05-05 NOTE — Patient Instructions (Signed)
It was a pleasure to see you today.  Thank you for giving [place] the opportunity to be involved in your care.  Below is a brief recap of your visit and next steps.  We will plan to see you again in 3 months.  Summary You have established care today No medication changes have been made We will follow up in 3 months We will attempt to get recent labs from Dr. Michelle Nasuti office

## 2023-05-05 NOTE — Assessment & Plan Note (Addendum)
S/p amputation of the second toe of the left foot in the setting of osteomyelitis.  This was performed by Dr. Lajoyce Corners on 7/12.  She is scheduled for follow-up next week.

## 2023-05-05 NOTE — Assessment & Plan Note (Signed)
Currently prescribed lovastatin 40 mg daily.  Labs were recently updated by her previous PCP.  Previous records requested today.

## 2023-05-05 NOTE — Assessment & Plan Note (Signed)
Remote history of CVA during hospital admission for hip arthroplasty.  No residual symptoms.  She is currently on statin therapy.  States she has never been told to take ASA.

## 2023-05-05 NOTE — Progress Notes (Signed)
Office Visit Note   Patient: Christy Fletcher           Date of Birth: [birth year]           MRN: [id number] Visit Date: 04/21/2023              Requested by: Gareth Morgan, MD [address] [place],  [place] [address] PCP: Gareth Morgan, MD  Chief Complaint  Patient presents with   Left Foot - Routine Post Op    04/01/2023 left foot 2nd toe amputation       HPI: Patient is an 82 year old woman who is 3 weeks status post left second toe amputation.  Assessment & Plan: Visit Diagnoses:  1. Osteomyelitis of second toe of left foot (HCC)     Plan: Recommended elevation dressing changes.  Review remove sutures today.  Follow-Up Instructions: Return in about 3 weeks (around 05/12/2023).   Ortho Exam  Patient is alert, oriented, no adenopathy, well-dressed, normal affect, normal respiratory effort. Examination there is some swelling there is no drainage with elevation the redness resolves.  Patient has no chills fevers or sweats.  Sutures are harvested.  Imaging: No results found. No images are attached to the encounter.  Labs: Lab Results  Component Value Date   HGBA1C 5.6 11/22/2017   REPTSTATUS 03/31/2023 FINAL 03/26/2023   CULT  03/26/2023    NO GROWTH 5 DAYS Performed at [facility], [address], [place], [place] [address]      No results found for: "ALBUMIN", "PREALBUMIN", "CBC"  No results found for: "MG" No results found for: "VD25OH"  No results found for: "PREALBUMIN"    Latest Ref Rng & Units 03/26/2023   11:18 AM 05/27/2021   12:42 PM 11/23/2017    8:53 AM  CBC EXTENDED  WBC 4.0 - 10.5 K/uL 9.0  4.5  9.4   RBC 3.87 - 5.11 MIL/uL 4.36  4.32  3.77   Hemoglobin 12.0 - 15.0 g/dL 82.9  56.2  13.0   HCT 36.0 - 46.0 % 40.5  39.5  35.1   Platelets 150 - 400 K/uL 160  226  207      There is no height or weight on file to calculate BMI.  Orders:  No orders of the defined types were placed in this encounter.  No orders of the defined  types were placed in this encounter.    Procedures: No procedures performed  Clinical Data: No additional findings.  ROS:  All other systems negative, except as noted in the HPI. Review of Systems  Objective: Vital Signs: There were no vitals taken for this visit.  Specialty Comments:  No specialty comments available.  PMFS History: Patient Active Problem List   Diagnosis Date Noted   Osteomyelitis of second toe of left foot (HCC) 04/01/2023   Cerebral embolism with cerebral infarction 11/23/2017   Osteopenia 11/19/2017   Arthritis 11/19/2017   Dyslipidemia 11/19/2017   Hip fracture (HCC) 11/19/2017   Decreased range of motion of right elbow 11/19/2013   Muscle weakness (generalized) 11/19/2013   Edema 11/19/2013   Pain in joint, forearm 11/19/2013   Past Medical History:  Diagnosis Date   Arthritis    Diarrhea    Hypercholesteremia    Osteopenia    Stroke (HCC)     Family History  Problem Relation Age of Onset   Colon cancer Neg Hx     Past Surgical History:  Procedure Laterality Date   ABDOMINAL HYSTERECTOMY  AMPUTATION Left 04/01/2023   Procedure: LEFT 2ND TOE AMPUTATION;  Surgeon: Nadara Mustard, MD;  Location: [facility] OR;  Service: Orthopedics;  Laterality: Left;   CATARACT EXTRACTION     COLONOSCOPY     COLONOSCOPY N/A 05/14/2013   Procedure: COLONOSCOPY;  Surgeon: West Bali, MD;  [facility];  Service: Endoscopy;  Laterality: N/A;  10:30 AM-rescheduled to 8:45am Doris notified pt   HERNIA REPAIR     HIP ARTHROPLASTY Right 11/20/2017   Procedure: RIGHT HIP HEMIARTHROPLASTY;  Surgeon: Eldred Manges, MD;  Location: [facility] OR;  Service: Orthopedics;  Laterality: Right;   LOOP RECORDER INSERTION N/A 11/24/2017   Procedure: LOOP RECORDER INSERTION;  Surgeon: Regan Lemming, MD;  Location[facility] INVASIVE CV LAB;  Service: Cardiovascular;  Laterality: N/A;   TEE WITHOUT CARDIOVERSION N/A 11/24/2017   Procedure: TRANSESOPHAGEAL ECHOCARDIOGRAM (TEE)  WITH LOOP;  Surgeon: Laurey Morale, MD;  Location: [facility] ENDOSCOPY;  Service: Cardiovascular;  Laterality: N/A;   Social History   Occupational History   Not on file  Tobacco Use   Smoking status: Never   Smokeless tobacco: Never  Vaping Use   Vaping status: Never Used  Substance and Sexual Activity   Alcohol use: No   Drug use: No   Sexual activity: Not on file

## 2023-05-05 NOTE — Progress Notes (Signed)
New Patient Office Visit  Subjective    Patient ID: Christy Fletcher, female    DOB: 1941/04/17  Age: 82 y.o. MRN: [id number]  CC:  Chief Complaint  Patient presents with   Establish Care    HPI Christy Fletcher presents to establish care.  She is an 82 year old woman who endorses a past medical history significant for hyperlipidemia and previous CVA.  She recently underwent second toe amputation of the left foot in the setting of osteomyelitis.  Previously followed by Dr. Sudie Bailey.  Christy Fletcher reports feeling well today.  She is asymptomatic currently and has no acute concerns to discuss.  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  She denies a history of tobacco, alcohol, and illicit drug use.  Her family medical history is significant for COPD.  Chronic medical conditions and outstanding preventative care items discussed today are individually addressed A/P below.   Outpatient Encounter Medications as of 05/05/2023  Medication Sig   acetaminophen (TYLENOL) 500 MG tablet Take 1,000 mg by mouth every 6 (six) hours as needed for moderate pain.   Calcium Carb-Cholecalciferol (CALCIUM + VITAMIN D3 PO) Take by mouth.   lovastatin (MEVACOR) 40 MG tablet Take 40 mg by mouth at bedtime.   HYDROcodone-acetaminophen (NORCO/VICODIN) 5-325 MG tablet Take 1 tablet by mouth every 4 (four) hours as needed. (Patient not taking: Reported on 05/05/2023)   [DISCONTINUED] Multiple Vitamin (MULTIVITAMIN WITH MINERALS) TABS tablet Take 1 tablet by mouth daily.   No facility-administered encounter medications on file as of 05/05/2023.    Past Medical History:  Diagnosis Date   Arthritis    Diarrhea    Hypercholesteremia    Osteopenia    Stroke [facility])     Past Surgical History:  Procedure Laterality Date   ABDOMINAL HYSTERECTOMY     AMPUTATION Left 04/01/2023   Procedure: LEFT 2ND TOE AMPUTATION;  Surgeon: Nadara Mustard, MD;  Location: [facility] OR;   Service: Orthopedics;  Laterality: Left;   CATARACT EXTRACTION     COLONOSCOPY     COLONOSCOPY N/A 05/14/2013   Procedure: COLONOSCOPY;  Surgeon: West Bali, MD;  Location[facility];  Service: Endoscopy;  Laterality: N/A;  10:30 AM-rescheduled to 8:45am Doris notified pt   HERNIA REPAIR     HIP ARTHROPLASTY Right 11/20/2017   Procedure: RIGHT HIP HEMIARTHROPLASTY;  Surgeon: Eldred Manges, MD;  Location: [facility] OR;  Service: Orthopedics;  Laterality: Right;   LOOP RECORDER INSERTION N/A 11/24/2017   Procedure: LOOP RECORDER INSERTION;  Surgeon: Regan Lemming, MD;  Location: [facility] INVASIVE CV LAB;  Service: Cardiovascular;  Laterality: N/A;   TEE WITHOUT CARDIOVERSION N/A 11/24/2017   Procedure: TRANSESOPHAGEAL ECHOCARDIOGRAM (TEE) WITH LOOP;  Surgeon: Laurey Morale, MD;  Location: [facility] ENDOSCOPY;  Service: Cardiovascular;  Laterality: N/A;    Family History  Problem Relation Age of Onset   Colon cancer Neg Hx     Social History   Socioeconomic History   Marital status: Married    Spouse name: Not on file   Number of children: Not on file   Years of education: Not on file   Highest education level: Not on file  Occupational History   Not on file  Tobacco Use   Smoking status: Never   Smokeless tobacco: Never  Vaping Use   Vaping status: Never Used  Substance and Sexual Activity   Alcohol use: No   Drug use: No  Sexual activity: Not on file  Other Topics Concern   Not on file  Social History Narrative   Not on file   Social Determinants of Health   Financial Resource Strain: Not on file  Food Insecurity: Not on file  Transportation Needs: Not on file  Physical Activity: Not on file  Stress: Not on file  Social Connections: Not on file  Intimate Partner Violence: Not on file    Review of Systems  Constitutional:  Negative for chills and fever.  HENT:  Negative for sore throat.   Respiratory:  Negative for cough and shortness of breath.   Cardiovascular:   Negative for chest pain, palpitations and leg swelling.  Gastrointestinal:  Negative for abdominal pain, blood in stool, constipation, diarrhea, nausea and vomiting.  Genitourinary:  Negative for dysuria and hematuria.  Musculoskeletal:  Negative for myalgias.  Skin:  Negative for itching and rash.  Neurological:  Negative for dizziness and headaches.  Psychiatric/Behavioral:  Negative for depression and suicidal ideas.    Objective    BP 128/74   Pulse 74   Ht 5' 1.5" (1.562 m)   Wt 113 lb (51.3 kg)   SpO2 96%   BMI 21.01 kg/m   Physical Exam Vitals reviewed.  Constitutional:      General: She is not in acute distress.    Appearance: Normal appearance. She is not toxic-appearing.  HENT:     Head: Normocephalic and atraumatic.     Right Ear: External ear normal.     Left Ear: External ear normal.     Nose: Nose normal. No congestion or rhinorrhea.     Mouth/Throat:     Mouth: Mucous membranes are moist.     Pharynx: Oropharynx is clear. No oropharyngeal exudate or posterior oropharyngeal erythema.  Eyes:     General: No scleral icterus.    Extraocular Movements: Extraocular movements intact.     Conjunctiva/sclera: Conjunctivae normal.     Pupils: Pupils are equal, round, and reactive to light.  Cardiovascular:     Rate and Rhythm: Normal rate and regular rhythm.     Pulses: Normal pulses.     Heart sounds: Normal heart sounds. No murmur heard.    No friction rub. No gallop.  Pulmonary:     Effort: Pulmonary effort is normal.     Breath sounds: Normal breath sounds. No wheezing, rhonchi or rales.  Abdominal:     General: Abdomen is flat. Bowel sounds are normal. There is no distension.     Palpations: Abdomen is soft.     Tenderness: There is no abdominal tenderness.  Musculoskeletal:        General: No swelling. Normal range of motion.     Cervical back: Normal range of motion.     Right lower leg: No edema.     Left lower leg: No edema.     Comments: Walking  boot on left foot  Lymphadenopathy:     Cervical: No cervical adenopathy.  Skin:    General: Skin is warm and dry.     Capillary Refill: Capillary refill takes less than 2 seconds.     Coloration: Skin is not jaundiced.  Neurological:     General: No focal deficit present.     Mental Status: She is alert and oriented to person, place, and time.  Psychiatric:        Mood and Affect: Mood normal.        Behavior: Behavior normal.    Assessment &  Plan:   Problem List Items Addressed This Visit       Cerebral embolism with cerebral infarction - Primary    Remote history of CVA during hospital admission for hip arthroplasty.  No residual symptoms.  She is currently on statin therapy.  States she has never been told to take ASA.      Osteomyelitis of second toe of left foot (HCC)    S/p amputation of the second toe of the left foot in the setting of osteomyelitis.  This was performed by Dr. Lajoyce Corners on 7/12.  She is scheduled for follow-up next week.      Dyslipidemia    Currently prescribed lovastatin 40 mg daily.  Labs were recently updated by her previous PCP.  Previous records requested today.       Return in about 3 months (around 08/05/2023).   Billie Lade, MD

## 2023-05-12 ENCOUNTER — Ambulatory Visit: Payer: Medicare HMO | Admitting: Orthopedic Surgery

## 2023-05-12 DIAGNOSIS — M869 Osteomyelitis, unspecified: Secondary | ICD-10-CM

## 2023-05-23 ENCOUNTER — Encounter: Payer: Self-pay | Admitting: Orthopedic Surgery

## 2023-05-23 NOTE — Progress Notes (Signed)
Patient is an 82 year old woman who is status post left second toe amputation July 12.  Patient is full weightbearing in regular shoes.  The incision is well-healed there is venous stasis swelling recommended compression and resume all activities.

## 2023-06-16 ENCOUNTER — Telehealth: Payer: Self-pay | Admitting: Internal Medicine

## 2023-06-16 NOTE — Telephone Encounter (Signed)
Clinical has not called patient

## 2023-06-16 NOTE — Telephone Encounter (Signed)
Pt called in returning call from office  Unsure of who called.   Font desk has not called pt

## 2023-08-05 ENCOUNTER — Ambulatory Visit (INDEPENDENT_AMBULATORY_CARE_PROVIDER_SITE_OTHER): Payer: Medicare HMO | Admitting: Internal Medicine

## 2023-08-05 ENCOUNTER — Encounter: Payer: Self-pay | Admitting: Internal Medicine

## 2023-08-05 VITALS — BP 140/78 | HR 74 | Resp 16 | Ht 61.0 in | Wt 117.0 lb

## 2023-08-05 DIAGNOSIS — M79675 Pain in left toe(s): Secondary | ICD-10-CM | POA: Diagnosis not present

## 2023-08-05 DIAGNOSIS — M21612 Bunion of left foot: Secondary | ICD-10-CM | POA: Diagnosis not present

## 2023-08-05 NOTE — Patient Instructions (Signed)
It was a pleasure to see you today.  Thank you for giving [place] the opportunity to be involved in your care.  Below is a brief recap of your visit and next steps.  We will plan to see you again in 3 months.  Summary Podiatry referral placed today We will plan for follow up in  3 months

## 2023-08-05 NOTE — Assessment & Plan Note (Signed)
Her acute concern today is pain in the third digit of her left foot.  It appears mildly swollen but not significantly changed from photos taken in July.  There is no surrounding erythema.  Lower concern for infection.  I favor that the etiology of pain is related to her foot wear and also a bunion.  The great toe is laterally deviated such that it may be impairing the third digit.  She is interested in treatment options.  Podiatry referral placed today.

## 2023-08-05 NOTE — Progress Notes (Signed)
Established Patient Office Visit  Subjective   Patient ID: Christy Fletcher, female    DOB: [birth year]  Age: 82 y.o. MRN: [id number]  Chief Complaint  Patient presents with   Toe swelling    Had her 2nd toe removed in July and now the 3rd toe has been very painful and has been swelling x 4 weeks    Christy Fletcher returns to care today for routine follow-up.  She was last evaluated by me on 8/15 as a new patient presenting to establish care.  No medication changes were made at that time and 84-month follow-up was arranged.  In the interim, she has been seen by orthopedic surgery for follow-up.  There have otherwise been no acute interval events.  Christy Fletcher reports feeling fairly well today.  Her acute concern is pain in the third digit of the left foot.  She is concerned that it is swollen.  This has been present for the last 4 weeks.  She has not appreciated any significant redness.  Symptoms are better when she does not have to wear shoes.  Denies systemic symptoms of fever/chills, myalgias, and fatigue.  Past Medical History:  Diagnosis Date   Arthritis    Diarrhea    Hypercholesteremia    Osteopenia    Stroke [facility])    Past Surgical History:  Procedure Laterality Date   ABDOMINAL HYSTERECTOMY     AMPUTATION Left 04/01/2023   Procedure: LEFT 2ND TOE AMPUTATION;  Surgeon: Nadara Mustard, MD;  Location: [facility] OR;  Service: Orthopedics;  Laterality: Left;   CATARACT EXTRACTION     COLONOSCOPY     COLONOSCOPY N/A 05/14/2013   Procedure: COLONOSCOPY;  Surgeon: West Bali, MD;  [facility] ENDO SU[facility];  Service: Endoscopy;  Laterality: N/A;  10:30 AM-rescheduled to 8:45am Doris notified pt   HERNIA REPAIR     HIP ARTHROPLASTY Right 11/20/2017   Procedure: RIGHT HIP HEMIARTHROPLASTY;  Surgeon: Eldred Manges, MD;  Location: [facility] OR;  Service: Orthopedics;  Laterality: Right;   LOOP RECORDER INSERTION N/A 11/24/2017   Procedure: LOOP RECORDER INSERTION;  Surgeon: Regan Lemming, MD;  Location: [facility]  INVASIVE CV LAB;  Service: Cardiovascular;  Laterality: N/A;   TEE WITHOUT CARDIOVERSION N/A 11/24/2017   Procedure: TRANSESOPHAGEAL ECHOCARDIOGRAM (TEE) WITH LOOP;  Surgeon: Laurey Morale, MD;  Location: [facility] ENDOSCOPY;  Service: Cardiovascular;  Laterality: N/A;   Social History   Tobacco Use   Smoking status: Never   Smokeless tobacco: Never  Vaping Use   Vaping status: Never Used  Substance Use Topics   Alcohol use: No   Drug use: No   Family History  Problem Relation Age of Onset   Colon cancer Neg Hx    Allergies  Allergen Reactions   Plavix [Clopidogrel Bisulfate]     Unknown reaction   Review of Systems  HENT:  Positive for congestion.   Musculoskeletal:        Pain in left great toe and third digit     Objective:     BP (!) 140/78   Pulse 74   Resp 16   Ht 5\' 1"  (1.549 m)   Wt 117 lb (53.1 kg)   SpO2 96%   BMI 22.11 kg/m  BP Readings from Last 3 Encounters:  08/05/23 (!) 140/78  05/05/23 128/74  04/01/23 (!) 118/90   Physical Exam Vitals reviewed.  Constitutional:      General: She is not in acute distress.    Appearance: Normal  appearance. She is not toxic-appearing.  HENT:     Head: Normocephalic and atraumatic.     Right Ear: External ear normal.     Left Ear: External ear normal.     Nose: Nose normal. No congestion or rhinorrhea.     Mouth/Throat:     Mouth: Mucous membranes are moist.     Pharynx: Oropharynx is clear. No oropharyngeal exudate or posterior oropharyngeal erythema.  Eyes:     General: No scleral icterus.    Extraocular Movements: Extraocular movements intact.     Conjunctiva/sclera: Conjunctivae normal.     Pupils: Pupils are equal, round, and reactive to light.  Cardiovascular:     Rate and Rhythm: Normal rate and regular rhythm.     Pulses: Normal pulses.     Heart sounds: Normal heart sounds. No murmur heard.    No friction rub. No gallop.  Pulmonary:     Effort: Pulmonary effort is normal.     Breath sounds: Normal  breath sounds. No wheezing, rhonchi or rales.  Abdominal:     General: Abdomen is flat. Bowel sounds are normal. There is no distension.     Palpations: Abdomen is soft.     Tenderness: There is no abdominal tenderness.  Musculoskeletal:        General: No swelling. Normal range of motion.     Cervical back: Normal range of motion.     Right lower leg: No edema.     Left lower leg: No edema.     Comments: S/p amputation of 2nd digit of left foot. Bunion of great toe of left foot. No erythema present at third digit. Mild swelling present. Not significantly changed from photo on 7/26.   Lymphadenopathy:     Cervical: No cervical adenopathy.  Skin:    General: Skin is warm and dry.     Capillary Refill: Capillary refill takes less than 2 seconds.     Coloration: Skin is not jaundiced.  Neurological:     General: No focal deficit present.     Mental Status: She is alert and oriented to person, place, and time.  Psychiatric:        Mood and Affect: Mood normal.        Behavior: Behavior normal.   Last CBC Lab Results  Component Value Date   WBC 9.0 03/26/2023   HGB 13.2 03/26/2023   HCT 40.5 03/26/2023   MCV 92.9 03/26/2023   MCH 30.3 03/26/2023   RDW 14.1 03/26/2023   PLT 160 03/26/2023   Last metabolic panel Lab Results  Component Value Date   GLUCOSE 87 03/26/2023   NA 136 03/26/2023   K 3.6 03/26/2023   CL 104 03/26/2023   CO2 25 03/26/2023   BUN 9 03/26/2023   CREATININE 0.86 03/26/2023   GFRNONAA >60 03/26/2023   CALCIUM 8.4 (L) 03/26/2023   ANIONGAP 7 03/26/2023   Last lipids Lab Results  Component Value Date   CHOL 139 11/22/2017   HDL 51 11/22/2017   LDLCALC 64 11/22/2017   TRIG 122 11/22/2017   CHOLHDL 2.7 11/22/2017   Last hemoglobin A1c Lab Results  Component Value Date   HGBA1C 5.6 11/22/2017     Assessment & Plan:   Problem List Items Addressed This Visit       Bunion of great toe of left foot - Primary    Her acute concern today is pain  in the third digit of her left foot.  It appears mildly swollen  but not significantly changed from photos taken in July.  There is no surrounding erythema.  Lower concern for infection.  I favor that the etiology of pain is related to her foot wear and also a bunion.  The great toe is laterally deviated such that it may be impairing the third digit.  She is interested in treatment options.  Podiatry referral placed today.      Return in about 3 months (around 11/05/2023).   Billie Lade, MD

## 2023-08-10 ENCOUNTER — Other Ambulatory Visit (INDEPENDENT_AMBULATORY_CARE_PROVIDER_SITE_OTHER): Payer: Medicare HMO

## 2023-08-10 ENCOUNTER — Ambulatory Visit: Payer: Medicare HMO | Admitting: Family

## 2023-08-10 DIAGNOSIS — M79672 Pain in left foot: Secondary | ICD-10-CM | POA: Diagnosis not present

## 2023-08-11 ENCOUNTER — Encounter: Payer: Self-pay | Admitting: Family

## 2023-08-11 NOTE — Progress Notes (Signed)
Office Visit Note   Patient: Christy Fletcher           Date of Birth: [birth year]           MRN: [id number] Visit Date: 08/10/2023              Requested by: Billie Lade, MD [address] [place],  [place] [address] PCP: Billie Lade, MD  Chief Complaint  Patient presents with   Left Foot - Wound Check    Sore on 3rd toe      HPI: The patient is an 82 year old woman who presents today for concern of swelling to her right third toe.  She is status post second toe amputation so she is quite concerned about the swelling  Has not had any fevers chills no associated wound or redness  Assessment & Plan: Visit Diagnoses:  1. Pain in left foot     Plan: Flexible clawing of the left third toe.  Discussed custom orthotics that she also has issues with posterior tibial tendon dysfunction as well as pes planus on the right.  This is painful and has changed her gait  Given her an order for custom orthotics with carbon fiber plate with extra-depth shoes to [facility]  Follow-Up Instructions: No follow-ups on file.   Ortho Exam  Patient is alert, oriented, no adenopathy, well-dressed, normal affect, normal respiratory effort. On examination of the left foot she has a well-healed partial ray amputation of the second toe.  There is further valgus deformity of the great toe she has flexible clawing of the third toe at this time she does have mild edema without erythema no warmth no callus no impending ulceration  Imaging: No results found. No images are attached to the encounter.  Labs: Lab Results  Component Value Date   HGBA1C 5.6 11/22/2017   REPTSTATUS 03/31/2023 FINAL 03/26/2023   CULT  03/26/2023    NO GROWTH 5 DAYS Performed at [facility], [address] [place], [place] [address]      No results found for: "ALBUMIN", "PREALBUMIN", "CBC"  No results found for: "MG" No results found for: "VD25OH"  No results found for: "PREALBUMIN"     Latest Ref Rng & Units 03/26/2023   11:18 AM 05/27/2021   12:42 PM 11/23/2017    8:53 AM  CBC EXTENDED  WBC 4.0 - 10.5 K/uL 9.0  4.5  9.4   RBC 3.87 - 5.11 MIL/uL 4.36  4.32  3.77   Hemoglobin 12.0 - 15.0 g/dL 62.9  52.8  41.3   HCT 36.0 - 46.0 % 40.5  39.5  35.1   Platelets 150 - 400 K/uL 160  226  207      There is no height or weight on file to calculate BMI.  Orders:  Orders Placed This Encounter  Procedures   XR Foot Complete Left   No orders of the defined types were placed in this encounter.    Procedures: No procedures performed  Clinical Data: No additional findings.  ROS:  All other systems negative, except as noted in the HPI. Review of Systems  Constitutional: Negative.   Skin:  Negative for color change and wound.  All other systems reviewed and are negative.   Objective: Vital Signs: There were no vitals taken for this visit.  Specialty Comments:  No specialty comments available.  PMFS History: Patient Active Problem List   Diagnosis Date Noted   Bunion of great toe  of left foot 08/05/2023   Osteomyelitis of second toe of left foot (HCC) 04/01/2023   Cerebral embolism with cerebral infarction 11/23/2017   Osteopenia 11/19/2017   Arthritis 11/19/2017   Dyslipidemia 11/19/2017   Hip fracture (HCC) 11/19/2017   Decreased range of motion of right elbow 11/19/2013   Muscle weakness (generalized) 11/19/2013   Edema 11/19/2013   Pain in joint, forearm 11/19/2013   Past Medical History:  Diagnosis Date   Arthritis    Diarrhea    Hypercholesteremia    Osteopenia    Stroke [facility])     Family History  Problem Relation Age of Onset   Colon cancer Neg Hx     Past Surgical History:  Procedure Laterality Date   ABDOMINAL HYSTERECTOMY     AMPUTATION Left 04/01/2023   Procedure: LEFT 2ND TOE AMPUTATION;  Surgeon: Nadara Mustard, MD;  Location: [facility] OR;  Service: Orthopedics;  Laterality: Left;   CATARACT EXTRACTION     COLONOSCOPY     COLONOSCOPY N/A  05/14/2013   Procedure: COLONOSCOPY;  Surgeon: West Bali, MD;  [facility];  Service: Endoscopy;  Laterality: N/A;  10:30 AM-rescheduled to 8:45am Doris notified pt   HERNIA REPAIR     HIP ARTHROPLASTY Right 11/20/2017   Procedure: RIGHT HIP HEMIARTHROPLASTY;  Surgeon: Eldred Manges, MD;  Location: [facility] OR;  Service: Orthopedics;  Laterality: Right;   LOOP RECORDER INSERTION N/A 11/24/2017   Procedure: LOOP RECORDER INSERTION;  Surgeon: Regan Lemming, MD;  Location: [facility] INVASIVE CV LAB;  Service: Cardiovascular;  Laterality: N/A;   TEE WITHOUT CARDIOVERSION N/A 11/24/2017   Procedure: TRANSESOPHAGEAL ECHOCARDIOGRAM (TEE) WITH LOOP;  Surgeon: Laurey Morale, MD;  Location: [facility] ENDOSCOPY;  Service: Cardiovascular;  Laterality: N/A;   Social History   Occupational History   Not on file  Tobacco Use   Smoking status: Never   Smokeless tobacco: Never  Vaping Use   Vaping status: Never Used  Substance and Sexual Activity   Alcohol use: No   Drug use: No   Sexual activity: Not on file

## 2023-11-11 ENCOUNTER — Encounter: Payer: Self-pay | Admitting: Internal Medicine

## 2023-11-11 ENCOUNTER — Ambulatory Visit (INDEPENDENT_AMBULATORY_CARE_PROVIDER_SITE_OTHER): Payer: Medicare HMO | Admitting: Internal Medicine

## 2023-11-11 VITALS — BP 133/78 | HR 69 | Ht 61.0 in | Wt 120.4 lb

## 2023-11-11 DIAGNOSIS — E785 Hyperlipidemia, unspecified: Secondary | ICD-10-CM

## 2023-11-11 DIAGNOSIS — I6310 Cerebral infarction due to embolism of unspecified precerebral artery: Secondary | ICD-10-CM

## 2023-11-11 NOTE — Assessment & Plan Note (Signed)
Adequately controlled on lipid panel from June 2024.  Remains on lovastatin 40 mg daily.  Repeat lipid panel at follow-up in 3 months

## 2023-11-11 NOTE — Progress Notes (Signed)
Established Patient Office Visit  Subjective   Patient ID: Christy Fletcher, female    DOB: [birth year]  Age: 83 y.o. MRN: [id number]  Chief Complaint  Patient presents with   Care Management    Three month follow up    Christy Fletcher returns to care today for routine follow-up.  She was last evaluated by me in November 2024.  At that time her biggest concern was pain in the third digit of her left foot.  She was referred to podiatry for evaluation and subsequently seen the following week.  There have otherwise been no acute interval events. Christy Fletcher reports feeling well today.  She is asymptomatic and has no acute concerns to discuss.  Past Medical History:  Diagnosis Date   Arthritis    Diarrhea    Hypercholesteremia    Osteopenia    Stroke [facility])    Past Surgical History:  Procedure Laterality Date   ABDOMINAL HYSTERECTOMY     AMPUTATION Left 04/01/2023   Procedure: LEFT 2ND TOE AMPUTATION;  Surgeon: Nadara Mustard, MD;  Location: [facility] OR;  Service: Orthopedics;  Laterality: Left;   CATARACT EXTRACTION     COLONOSCOPY     COLONOSCOPY N/A 05/14/2013   Procedure: COLONOSCOPY;  Surgeon: West Bali, MD;  Location: [facility] ENDO [facility];  Service: Endoscopy;  Laterality: N/A;  10:30 AM-rescheduled to 8:45am Doris notified pt   HERNIA REPAIR     HIP ARTHROPLASTY Right 11/20/2017   Procedure: RIGHT HIP HEMIARTHROPLASTY;  Surgeon: Eldred Manges, MD;  Location: [facility] OR;  Service: Orthopedics;  Laterality: Right;   LOOP RECORDER INSERTION N/A 11/24/2017   Procedure: LOOP RECORDER INSERTION;  Surgeon: Regan Lemming, MD;  Location: [facility] INVASIVE CV LAB;  Service: Cardiovascular;  Laterality: N/A;   TEE WITHOUT CARDIOVERSION N/A 11/24/2017   Procedure: TRANSESOPHAGEAL ECHOCARDIOGRAM (TEE) WITH LOOP;  Surgeon: Laurey Morale, MD;  Location: [facility] ENDOSCOPY;  Service: Cardiovascular;  Laterality: N/A;   Social History   Tobacco Use   Smoking status: Never   Smokeless tobacco: Never  Vaping Use    Vaping status: Never Used  Substance Use Topics   Alcohol use: No   Drug use: No   Family History  Problem Relation Age of Onset   Colon cancer Neg Hx    Allergies  Allergen Reactions   Plavix [Clopidogrel Bisulfate]     Unknown reaction   Review of Systems  Constitutional:  Negative for chills and fever.  HENT:  Negative for sore throat.   Respiratory:  Negative for cough and shortness of breath.   Cardiovascular:  Negative for chest pain, palpitations and leg swelling.  Gastrointestinal:  Negative for abdominal pain, blood in stool, constipation, diarrhea, nausea and vomiting.  Genitourinary:  Negative for dysuria and hematuria.  Musculoskeletal:  Negative for myalgias.  Skin:  Negative for itching and rash.  Neurological:  Negative for dizziness and headaches.  Psychiatric/Behavioral:  Negative for depression and suicidal ideas.      Objective:     BP 133/78 (BP Location: Left Arm, Patient Position: Sitting, Cuff Size: Normal)   Pulse 69   Ht 5\' 1"  (1.549 m)   Wt 120 lb 6.4 oz (54.6 kg)   SpO2 94%   BMI 22.75 kg/m  BP Readings from Last 3 Encounters:  11/11/23 133/78  08/05/23 (!) 140/78  05/05/23 128/74   Physical Exam Vitals reviewed.  Constitutional:      General: She is not in acute distress.    Appearance:  Normal appearance. She is not toxic-appearing.  HENT:     Head: Normocephalic and atraumatic.     Right Ear: External ear normal.     Left Ear: External ear normal.     Nose: Nose normal. No congestion or rhinorrhea.     Mouth/Throat:     Mouth: Mucous membranes are moist.     Pharynx: Oropharynx is clear. No oropharyngeal exudate or posterior oropharyngeal erythema.  Eyes:     General: No scleral icterus.    Extraocular Movements: Extraocular movements intact.     Conjunctiva/sclera: Conjunctivae normal.     Pupils: Pupils are equal, round, and reactive to light.  Cardiovascular:     Rate and Rhythm: Normal rate and regular rhythm.     Pulses:  Normal pulses.     Heart sounds: Normal heart sounds. No murmur heard.    No friction rub. No gallop.  Pulmonary:     Effort: Pulmonary effort is normal.     Breath sounds: Normal breath sounds. No wheezing, rhonchi or rales.  Abdominal:     General: Abdomen is flat. Bowel sounds are normal. There is no distension.     Palpations: Abdomen is soft.     Tenderness: There is no abdominal tenderness.  Musculoskeletal:        General: No swelling. Normal range of motion.     Cervical back: Normal range of motion.     Right lower leg: No edema.     Left lower leg: No edema.  Lymphadenopathy:     Cervical: No cervical adenopathy.  Skin:    General: Skin is warm and dry.     Capillary Refill: Capillary refill takes less than 2 seconds.     Coloration: Skin is not jaundiced.  Neurological:     General: No focal deficit present.     Mental Status: She is alert and oriented to person, place, and time.  Psychiatric:        Mood and Affect: Mood normal.        Behavior: Behavior normal.   Last CBC Lab Results  Component Value Date   WBC 9.0 03/26/2023   HGB 13.2 03/26/2023   HCT 40.5 03/26/2023   MCV 92.9 03/26/2023   MCH 30.3 03/26/2023   RDW 14.1 03/26/2023   PLT 160 03/26/2023   Last metabolic panel Lab Results  Component Value Date   GLUCOSE 87 03/26/2023   NA 136 03/26/2023   K 3.6 03/26/2023   CL 104 03/26/2023   CO2 25 03/26/2023   BUN 9 03/26/2023   CREATININE 0.86 03/26/2023   GFRNONAA >60 03/26/2023   CALCIUM 8.4 (L) 03/26/2023   ANIONGAP 7 03/26/2023   Last lipids Lab Results  Component Value Date   CHOL 139 11/22/2017   HDL 51 11/22/2017   LDLCALC 64 11/22/2017   TRIG 122 11/22/2017   CHOLHDL 2.7 11/22/2017   Last hemoglobin A1c Lab Results  Component Value Date   HGBA1C 5.6 11/22/2017     Assessment & Plan:   Problem List Items Addressed This Visit       Cerebral embolism with cerebral infarction - Primary   Had residual symptoms.  Remote  history of CVA during hospital admission for hip replacement surgery.  Remains on lovastatin.  No medication changes are indicated today.      Dyslipidemia   Adequately controlled on lipid panel from June 2024.  Remains on lovastatin 40 mg daily.  Repeat lipid panel at follow-up in 3 months  Return in about 3 months (around 02/08/2024).    Billie Lade, MD

## 2023-11-11 NOTE — Assessment & Plan Note (Signed)
Had residual symptoms.  Remote history of CVA during hospital admission for hip replacement surgery.  Remains on lovastatin.  No medication changes are indicated today.

## 2023-11-11 NOTE — Patient Instructions (Signed)
 It was a pleasure to see you today.  Thank you for giving [place] the opportunity to be involved in your care.  Below is a brief recap of your visit and next steps.  We will plan to see you again in 3 months.  Summary No medication changes today Follow up in 3 months

## 2023-11-23 DIAGNOSIS — H02403 Unspecified ptosis of bilateral eyelids: Secondary | ICD-10-CM | POA: Diagnosis not present

## 2023-11-23 DIAGNOSIS — H31091 Other chorioretinal scars, right eye: Secondary | ICD-10-CM | POA: Diagnosis not present

## 2023-11-23 DIAGNOSIS — Z961 Presence of intraocular lens: Secondary | ICD-10-CM | POA: Diagnosis not present

## 2023-11-23 DIAGNOSIS — H43813 Vitreous degeneration, bilateral: Secondary | ICD-10-CM | POA: Diagnosis not present

## 2024-02-10 ENCOUNTER — Encounter: Payer: Self-pay | Admitting: Internal Medicine

## 2024-02-10 ENCOUNTER — Ambulatory Visit (INDEPENDENT_AMBULATORY_CARE_PROVIDER_SITE_OTHER): Payer: Medicare HMO | Admitting: Internal Medicine

## 2024-02-10 VITALS — BP 137/70 | HR 74 | Ht 61.0 in | Wt 122.0 lb

## 2024-02-10 DIAGNOSIS — M8580 Other specified disorders of bone density and structure, unspecified site: Secondary | ICD-10-CM | POA: Diagnosis not present

## 2024-02-10 DIAGNOSIS — I6310 Cerebral infarction due to embolism of unspecified precerebral artery: Secondary | ICD-10-CM

## 2024-02-10 DIAGNOSIS — E785 Hyperlipidemia, unspecified: Secondary | ICD-10-CM

## 2024-02-10 MED ORDER — LOVASTATIN 40 MG PO TABS: 40.0000 mg | ORAL_TABLET | Freq: Every day | ORAL | 3 refills | Status: DC

## 2024-02-10 NOTE — Patient Instructions (Signed)
 It was a pleasure to see you today.  Thank you for giving us  the opportunity to be involved in your care.  Below is a brief recap of your visit and next steps.  We will plan to see you again in 6 months.  Summary No medication changes today Repeat labs and lovastatin refilled Follow up in 6 months

## 2024-02-10 NOTE — Assessment & Plan Note (Addendum)
 T-score -1.7 on DEXA from 2018.  She is currently takes daily vitamin D  supplementation.  Repeat vitamin D  level ordered today.

## 2024-02-10 NOTE — Assessment & Plan Note (Signed)
She is currently prescribed lovastatin 40 mg daily.  Repeat lipid panel ordered today.

## 2024-02-10 NOTE — Progress Notes (Signed)
 Established Patient Office Visit  Subjective   Patient ID: Christy ROTERT, female    DOB: 1940/10/23  Age: 83 y.o. MRN: [id number]  Chief Complaint  Patient presents with   Follow-up   [name] returns to care today for routine follow-up.  She was last evaluated by me on 2/21.  No medication changes were made and 47-month follow-up was arranged.  She has been seen by ophthalmology in the interim there have otherwise been no acute interval events.  Today she reports feeling well and has no acute concerns to discuss.  Past Medical History:  Diagnosis Date   Arthritis    Diarrhea    Hypercholesteremia    Osteopenia    Stroke [facility])    Past Surgical History:  Procedure Laterality Date   ABDOMINAL HYSTERECTOMY     AMPUTATION Left 04/01/2023   Procedure: LEFT 2ND TOE AMPUTATION;  Surgeon: Timothy Ford, MD;  Location: [facility] OR;  Service: Orthopedics;  Laterality: Left;   CATARACT EXTRACTION     COLONOSCOPY     COLONOSCOPY N/A 05/14/2013   Procedure: COLONOSCOPY;  Surgeon: Alyce Jubilee, MD;  Location: [facility] ENDO SUITE;  Service: Endoscopy;  Laterality: N/A;  10:30 AM-rescheduled to 8:45am Doris notified pt   HERNIA REPAIR     HIP ARTHROPLASTY Right 11/20/2017   Procedure: RIGHT HIP HEMIARTHROPLASTY;  Surgeon: Adah Acron, MD;  Location: [facility] OR;  Service: Orthopedics;  Laterality: Right;   LOOP RECORDER INSERTION N/A 11/24/2017   Procedure: LOOP RECORDER INSERTION;  Surgeon: Lei Pump, MD;  Location: [facility] INVASIVE CV LAB;  Service: Cardiovascular;  Laterality: N/A;   TEE WITHOUT CARDIOVERSION N/A 11/24/2017   Procedure: TRANSESOPHAGEAL ECHOCARDIOGRAM (TEE) WITH LOOP;  Surgeon: Darlis Eisenmenger, MD;  Location: [facility] ENDOSCOPY;  Service: Cardiovascular;  Laterality: N/A;   Social History   Tobacco Use   Smoking status: Never   Smokeless tobacco: Never  Vaping Use   Vaping status: Never Used  Substance Use Topics   Alcohol use: No   Drug use: No   Family History  Problem Relation  Age of Onset   Colon cancer Neg Hx    Allergies  Allergen Reactions   Plavix  [Clopidogrel  Bisulfate]     Unknown reaction   Review of Systems  Constitutional:  Negative for chills and fever.  HENT:  Negative for sore throat.   Respiratory:  Negative for cough and shortness of breath.   Cardiovascular:  Negative for chest pain, palpitations and leg swelling.  Gastrointestinal:  Negative for abdominal pain, blood in stool, constipation, diarrhea, nausea and vomiting.  Genitourinary:  Negative for dysuria and hematuria.  Musculoskeletal:  Negative for myalgias.  Skin:  Negative for itching and rash.  Neurological:  Negative for dizziness and headaches.  Psychiatric/Behavioral:  Negative for depression and suicidal ideas.      Objective:     BP 137/70   Pulse 74   Ht 5\' 1"  (1.549 m)   Wt 122 lb (55.3 kg)   SpO2 94%   BMI 23.05 kg/m  BP Readings from Last 3 Encounters:  02/10/24 137/70  11/11/23 133/78  08/05/23 (!) 140/78   Physical Exam Vitals reviewed.  Constitutional:      General: She is not in acute distress.    Appearance: Normal appearance. She is not toxic-appearing.  HENT:     Head: Normocephalic and atraumatic.     Right Ear: External ear normal.     Left Ear: External ear normal.  Nose: Nose normal. No congestion or rhinorrhea.     Mouth/Throat:     Mouth: Mucous membranes are moist.     Pharynx: Oropharynx is clear. No oropharyngeal exudate or posterior oropharyngeal erythema.  Eyes:     General: No scleral icterus.    Extraocular Movements: Extraocular movements intact.     Conjunctiva/sclera: Conjunctivae normal.     Pupils: Pupils are equal, round, and reactive to light.  Cardiovascular:     Rate and Rhythm: Normal rate and regular rhythm.     Pulses: Normal pulses.     Heart sounds: Normal heart sounds. No murmur heard.    No friction rub. No gallop.  Pulmonary:     Effort: Pulmonary effort is normal.     Breath sounds: Normal breath sounds.  No wheezing, rhonchi or rales.  Abdominal:     General: Abdomen is flat. Bowel sounds are normal. There is no distension.     Palpations: Abdomen is soft.     Tenderness: There is no abdominal tenderness.  Musculoskeletal:        General: No swelling. Normal range of motion.     Cervical back: Normal range of motion.     Right lower leg: No edema.     Left lower leg: No edema.  Lymphadenopathy:     Cervical: No cervical adenopathy.  Skin:    General: Skin is warm and dry.     Capillary Refill: Capillary refill takes less than 2 seconds.     Coloration: Skin is not jaundiced.  Neurological:     General: No focal deficit present.     Mental Status: She is alert and oriented to person, place, and time.  Psychiatric:        Mood and Affect: Mood normal.        Behavior: Behavior normal.   Last CBC Lab Results  Component Value Date   WBC 9.0 03/26/2023   HGB 13.2 03/26/2023   HCT 40.5 03/26/2023   MCV 92.9 03/26/2023   MCH 30.3 03/26/2023   RDW 14.1 03/26/2023   PLT 160 03/26/2023   Last metabolic panel Lab Results  Component Value Date   GLUCOSE 87 03/26/2023   NA 136 03/26/2023   K 3.6 03/26/2023   CL 104 03/26/2023   CO2 25 03/26/2023   BUN 9 03/26/2023   CREATININE 0.86 03/26/2023   GFRNONAA >60 03/26/2023   CALCIUM  8.4 (L) 03/26/2023   ANIONGAP 7 03/26/2023   Last lipids Lab Results  Component Value Date   CHOL 139 11/22/2017   HDL 51 11/22/2017   LDLCALC 64 11/22/2017   TRIG 122 11/22/2017   CHOLHDL 2.7 11/22/2017   Last hemoglobin A1c Lab Results  Component Value Date   HGBA1C 5.6 11/22/2017     Assessment & Plan:   Problem List Items Addressed This Visit       Osteopenia - Primary   T-score -1.7 on DEXA from 2018.  She is currently takes daily vitamin D  supplementation.  Repeat vitamin D  level ordered today.      Dyslipidemia   She is currently prescribed lovastatin 40 mg daily.  Repeat lipid panel ordered today.      Return in about 6  months (around 08/12/2024).   Tobi Fortes, MD

## 2024-02-11 ENCOUNTER — Other Ambulatory Visit (HOSPITAL_COMMUNITY): Payer: Self-pay | Admitting: Internal Medicine

## 2024-02-11 DIAGNOSIS — Z1231 Encounter for screening mammogram for malignant neoplasm of breast: Secondary | ICD-10-CM

## 2024-02-11 LAB — CBC WITH DIFFERENTIAL/PLATELET
Basophils Absolute: 0 10*3/uL (ref 0.0–0.2)
Basos: 0 %
EOS (ABSOLUTE): 0.1 10*3/uL (ref 0.0–0.4)
Eos: 2 %
Hematocrit: 40.4 % (ref 34.0–46.6)
Hemoglobin: 13.3 g/dL (ref 11.1–15.9)
Immature Grans (Abs): 0 10*3/uL (ref 0.0–0.1)
Immature Granulocytes: 0 %
Lymphocytes Absolute: 1.7 10*3/uL (ref 0.7–3.1)
Lymphs: 24 %
MCH: 30.2 pg (ref 26.6–33.0)
MCHC: 32.9 g/dL (ref 31.5–35.7)
MCV: 92 fL (ref 79–97)
Monocytes Absolute: 0.4 10*3/uL (ref 0.1–0.9)
Monocytes: 6 %
Neutrophils Absolute: 4.6 10*3/uL (ref 1.4–7.0)
Neutrophils: 68 %
Platelets: 217 10*3/uL (ref 150–450)
RBC: 4.41 x10E6/uL (ref 3.77–5.28)
RDW: 12.2 % (ref 11.7–15.4)
WBC: 6.9 10*3/uL (ref 3.4–10.8)

## 2024-02-11 LAB — CMP14+EGFR
ALT: 7 IU/L (ref 0–32)
AST: 19 IU/L (ref 0–40)
Albumin: 4.1 g/dL (ref 3.7–4.7)
Alkaline Phosphatase: 96 IU/L (ref 44–121)
BUN/Creatinine Ratio: 16 (ref 12–28)
BUN: 11 mg/dL (ref 8–27)
Bilirubin Total: 0.3 mg/dL (ref 0.0–1.2)
CO2: 22 mmol/L (ref 20–29)
Calcium: 9 mg/dL (ref 8.7–10.3)
Chloride: 102 mmol/L (ref 96–106)
Creatinine, Ser: 0.67 mg/dL (ref 0.57–1.00)
Globulin, Total: 2 g/dL (ref 1.5–4.5)
Glucose: 109 mg/dL — ABNORMAL HIGH (ref 70–99)
Potassium: 4 mmol/L (ref 3.5–5.2)
Sodium: 140 mmol/L (ref 134–144)
Total Protein: 6.1 g/dL (ref 6.0–8.5)
eGFR: 88 mL/min/{1.73_m2} (ref >59)

## 2024-02-11 LAB — LIPID PANEL
Chol/HDL Ratio: 3.9 ratio (ref 0.0–4.4)
Cholesterol, Total: 206 mg/dL — ABNORMAL HIGH (ref 100–199)
HDL: 53 mg/dL (ref >39)
LDL Chol Calc (NIH): 116 mg/dL — ABNORMAL HIGH (ref 0–99)
Triglycerides: 213 mg/dL — ABNORMAL HIGH (ref 0–149)
VLDL Cholesterol Cal: 37 mg/dL (ref 5–40)

## 2024-02-11 LAB — TSH+FREE T4
Free T4: 1.16 ng/dL (ref 0.82–1.77)
TSH: 2.78 u[IU]/mL (ref 0.450–4.500)

## 2024-02-11 LAB — B12 AND FOLATE PANEL
Folate: 17.9 ng/mL (ref >3.0)
Vitamin B-12: 366 pg/mL (ref 232–1245)

## 2024-02-11 LAB — VITAMIN D 25 HYDROXY (VIT D DEFICIENCY, FRACTURES): Vit D, 25-Hydroxy: 14.6 ng/mL — ABNORMAL LOW (ref 30.0–100.0)

## 2024-02-13 ENCOUNTER — Other Ambulatory Visit: Payer: Self-pay | Admitting: Internal Medicine

## 2024-02-13 ENCOUNTER — Ambulatory Visit: Payer: Self-pay | Admitting: Internal Medicine

## 2024-02-13 DIAGNOSIS — E785 Hyperlipidemia, unspecified: Secondary | ICD-10-CM

## 2024-02-13 MED ORDER — LOVASTATIN 40 MG PO TABS: 80.0000 mg | ORAL_TABLET | Freq: Every day | ORAL | 3 refills | Status: AC

## 2024-02-15 ENCOUNTER — Encounter (HOSPITAL_COMMUNITY): Payer: Self-pay

## 2024-02-15 ENCOUNTER — Ambulatory Visit (HOSPITAL_COMMUNITY)
Admission: RE | Admit: 2024-02-15 | Discharge: 2024-02-15 | Disposition: A | Discharge status: Home / Self Care | Source: Ambulatory Visit | Attending: Internal Medicine | Admitting: Internal Medicine

## 2024-02-15 DIAGNOSIS — Z1231 Encounter for screening mammogram for malignant neoplasm of breast: Secondary | ICD-10-CM | POA: Insufficient documentation

## 2024-02-27 ENCOUNTER — Telehealth: Payer: Self-pay | Admitting: Internal Medicine

## 2024-02-27 NOTE — Telephone Encounter (Unsigned)
 Copied from CRM [id number]. Topic: Clinical - Medication Question >> Feb 27, 2024  9:10 AM Marlan Silva wrote: Reason for CRM: Patient is requesting information on how much calcium  rx she should be taking. Patient requesting a call back to [phone].

## 2024-02-28 NOTE — Telephone Encounter (Signed)
 Patient advised.

## 2024-03-27 ENCOUNTER — Ambulatory Visit (INDEPENDENT_AMBULATORY_CARE_PROVIDER_SITE_OTHER): Payer: Medicare HMO | Admitting: Otolaryngology

## 2024-03-27 ENCOUNTER — Encounter (INDEPENDENT_AMBULATORY_CARE_PROVIDER_SITE_OTHER): Payer: Self-pay | Admitting: Otolaryngology

## 2024-03-27 VITALS — BP 135/76 | HR 66 | Ht 61.0 in | Wt 122.0 lb

## 2024-03-27 DIAGNOSIS — H903 Sensorineural hearing loss, bilateral: Secondary | ICD-10-CM | POA: Diagnosis not present

## 2024-03-27 DIAGNOSIS — J310 Chronic rhinitis: Secondary | ICD-10-CM

## 2024-03-27 DIAGNOSIS — R0981 Nasal congestion: Secondary | ICD-10-CM

## 2024-03-27 DIAGNOSIS — H6123 Impacted cerumen, bilateral: Secondary | ICD-10-CM

## 2024-03-27 NOTE — Progress Notes (Unsigned)
 Patient ID: Christy Fletcher, female   DOB: December 20, 1940, 83 y.o.   MRN: [id number]  Follow up: Hearing loss New complaint: Chronic nasal congestion.  HPI:  The patient is an 83 year old female who returns today for follow-up evaluation.  She was last seen in July 2024.  At that time, she was noted to have bilateral high-frequency sensorineural hearing loss.  She also has a history of recurrent cerumen impaction.  The patient returns today reporting no significant change in her hearing.  She has never worn hearing aids.  She has a new complaint of chronic nasal congestion.  She has been symptomatic for several months.  She uses Zyrtec daily.  Currently she denies any otalgia, otorrhea, facial pain, or fever.  Exam: General: Communicates without difficulty, well nourished, no acute distress. Head: Normocephalic, no evidence injury, no tenderness, facial buttresses intact without stepoff. Face/sinus: No tenderness to palpation and percussion. Facial movement is normal and symmetric. Eyes: PERRL, EOMI. No scleral icterus, conjunctivae clear. Neuro: CN II exam reveals vision grossly intact.  No nystagmus at any point of gaze. Ears: Auricles well formed without lesions.  Bilateral cerumen impaction.  Nose: External evaluation reveals normal support and skin without lesions.  Dorsum is intact.  Anterior rhinoscopy reveals congested mucosa over anterior aspect of inferior turbinates and intact septum.  No purulence noted. Oral:  Oral cavity and oropharynx are intact, symmetric, without erythema or edema.  Mucosa is moist without lesions. Neck: Full range of motion without pain.  There is no significant lymphadenopathy.  No masses palpable.  Thyroid bed within normal limits to palpation.  Parotid glands and submandibular glands equal bilaterally without mass.  Trachea is midline. Neuro:  CN 2-12 grossly intact.   Procedure: Bilateral cerumen disimpaction Anesthesia: None Description: Under the operating microscope,  the cerumen is carefully removed with a combination of cerumen currette, alligator forceps, and suction catheters.  After the cerumen is removed, the TMs are noted to be normal.  No mass, erythema, or lesions. The patient tolerated the procedure well.    Assessment: 1.  Bilateral recurrent cerumen impaction. 2.  After the cerumen disimpaction procedure, both tympanic membranes and middle ear spaces are noted to be normal. 3.  Subjectively stable bilateral high-frequency sensorineural hearing loss, likely secondary to routine presbycusis. 4.  Chronic rhinitis with nasal mucosal congestion.  Plan: 1.  Otomicroscopy with bilateral cerumen disimpaction. 2.  The physical exam findings are reviewed with the patient. 3.  The patient is a candidate for hearing amplification.  The hearing aid options are discussed.  4.  Flonase  nasal spray 2 sprays each nostril daily. 5.  The patient will return for reevaluation in 1 year.  We will obtain a repeat hearing test at that time.

## 2024-03-28 DIAGNOSIS — J310 Chronic rhinitis: Secondary | ICD-10-CM | POA: Insufficient documentation

## 2024-03-28 DIAGNOSIS — H6123 Impacted cerumen, bilateral: Secondary | ICD-10-CM | POA: Insufficient documentation

## 2024-03-28 DIAGNOSIS — H903 Sensorineural hearing loss, bilateral: Secondary | ICD-10-CM | POA: Insufficient documentation

## 2024-06-12 ENCOUNTER — Telehealth: Payer: Self-pay

## 2024-06-12 NOTE — Telephone Encounter (Signed)
 Copied from CRM #[id number]. Topic: General - Other >> Jun 12, 2024  2:02 PM Dawna HERO wrote: Reason for CRM: home health called on her behalf to let provider know she has been forgetting to take medication lovastatin  (MEVACOR ) 40 MG tablet, says she advises her to use a pill organizer or set alarm, just wanted to make provider aware

## 2024-07-11 ENCOUNTER — Emergency Department (HOSPITAL_COMMUNITY)

## 2024-07-11 ENCOUNTER — Encounter (HOSPITAL_COMMUNITY): Payer: Self-pay

## 2024-07-11 ENCOUNTER — Inpatient Hospital Stay (HOSPITAL_COMMUNITY)
Admission: EM | Admit: 2024-07-11 | Discharge: 2024-07-13 | DRG: 536 | Disposition: A | Discharge status: Home Health | Attending: Internal Medicine | Admitting: Internal Medicine

## 2024-07-11 ENCOUNTER — Other Ambulatory Visit: Payer: Self-pay

## 2024-07-11 DIAGNOSIS — E785 Hyperlipidemia, unspecified: Secondary | ICD-10-CM | POA: Diagnosis present

## 2024-07-11 DIAGNOSIS — W010XXA Fall on same level from slipping, tripping and stumbling without subsequent striking against object, initial encounter: Secondary | ICD-10-CM | POA: Diagnosis present

## 2024-07-11 DIAGNOSIS — Z6823 Body mass index (BMI) 23.0-23.9, adult: Secondary | ICD-10-CM

## 2024-07-11 DIAGNOSIS — S72001A Fracture of unspecified part of neck of right femur, initial encounter for closed fracture: Principal | ICD-10-CM

## 2024-07-11 DIAGNOSIS — M978XXA Periprosthetic fracture around other internal prosthetic joint, initial encounter: Secondary | ICD-10-CM

## 2024-07-11 DIAGNOSIS — Z79899 Other long term (current) drug therapy: Secondary | ICD-10-CM

## 2024-07-11 DIAGNOSIS — M9701XA Periprosthetic fracture around internal prosthetic right hip joint, initial encounter: Secondary | ICD-10-CM | POA: Diagnosis present

## 2024-07-11 DIAGNOSIS — E440 Moderate protein-calorie malnutrition: Secondary | ICD-10-CM | POA: Diagnosis present

## 2024-07-11 DIAGNOSIS — Z95818 Presence of other cardiac implants and grafts: Secondary | ICD-10-CM

## 2024-07-11 DIAGNOSIS — Z8673 Personal history of transient ischemic attack (TIA), and cerebral infarction without residual deficits: Secondary | ICD-10-CM

## 2024-07-11 DIAGNOSIS — Y92008 Other place in unspecified non-institutional (private) residence as the place of occurrence of the external cause: Secondary | ICD-10-CM

## 2024-07-11 DIAGNOSIS — Z89422 Acquired absence of other left toe(s): Secondary | ICD-10-CM

## 2024-07-11 DIAGNOSIS — Z888 Allergy status to other drugs, medicaments and biological substances status: Secondary | ICD-10-CM

## 2024-07-11 DIAGNOSIS — W19XXXA Unspecified fall, initial encounter: Secondary | ICD-10-CM | POA: Diagnosis not present

## 2024-07-11 DIAGNOSIS — E7800 Pure hypercholesterolemia, unspecified: Secondary | ICD-10-CM | POA: Diagnosis present

## 2024-07-11 DIAGNOSIS — D72829 Elevated white blood cell count, unspecified: Secondary | ICD-10-CM | POA: Diagnosis present

## 2024-07-11 DIAGNOSIS — S72111A Displaced fracture of greater trochanter of right femur, initial encounter for closed fracture: Secondary | ICD-10-CM | POA: Diagnosis not present

## 2024-07-11 DIAGNOSIS — Z96641 Presence of right artificial hip joint: Secondary | ICD-10-CM | POA: Diagnosis not present

## 2024-07-11 DIAGNOSIS — M8580 Other specified disorders of bone density and structure, unspecified site: Secondary | ICD-10-CM | POA: Diagnosis present

## 2024-07-11 DIAGNOSIS — M1711 Unilateral primary osteoarthritis, right knee: Secondary | ICD-10-CM | POA: Diagnosis not present

## 2024-07-11 LAB — CBC WITH DIFFERENTIAL/PLATELET
Abs Immature Granulocytes: 0.04 K/uL (ref 0.00–0.07)
Basophils Absolute: 0 K/uL (ref 0.0–0.1)
Basophils Relative: 0 %
Eosinophils Absolute: 0.1 K/uL (ref 0.0–0.5)
Eosinophils Relative: 1 %
HCT: 43.3 % (ref 36.0–46.0)
Hemoglobin: 14.3 g/dL (ref 12.0–15.0)
Immature Granulocytes: 0 %
Lymphocytes Relative: 14 %
Lymphs Abs: 1.6 K/uL (ref 0.7–4.0)
MCH: 30.4 pg (ref 26.0–34.0)
MCHC: 33 g/dL (ref 30.0–36.0)
MCV: 92.1 fL (ref 80.0–100.0)
Monocytes Absolute: 0.6 K/uL (ref 0.1–1.0)
Monocytes Relative: 5 %
Neutro Abs: 8.5 K/uL — ABNORMAL HIGH (ref 1.7–7.7)
Neutrophils Relative %: 80 %
Platelets: 222 K/uL (ref 150–400)
RBC: 4.7 MIL/uL (ref 3.87–5.11)
RDW: 13 % (ref 11.5–15.5)
WBC: 10.8 K/uL — ABNORMAL HIGH (ref 4.0–10.5)
nRBC: 0 % (ref 0.0–0.2)

## 2024-07-11 LAB — COMPREHENSIVE METABOLIC PANEL WITH GFR
ALT: 11 U/L (ref 0–44)
AST: 21 U/L (ref 15–41)
Albumin: 4.2 g/dL (ref 3.5–5.0)
Alkaline Phosphatase: 94 U/L (ref 38–126)
Anion gap: 11 (ref 5–15)
BUN: 14 mg/dL (ref 8–23)
CO2: 28 mmol/L (ref 22–32)
Calcium: 9.8 mg/dL (ref 8.9–10.3)
Chloride: 102 mmol/L (ref 98–111)
Creatinine, Ser: 0.87 mg/dL (ref 0.44–1.00)
GFR, Estimated: >60 mL/min (ref >60)
Glucose, Bld: 100 mg/dL — ABNORMAL HIGH (ref 70–99)
Potassium: 4.3 mmol/L (ref 3.5–5.1)
Sodium: 140 mmol/L (ref 135–145)
Total Bilirubin: 0.3 mg/dL (ref 0.0–1.2)
Total Protein: 6.8 g/dL (ref 6.5–8.1)

## 2024-07-11 MED ORDER — OXYCODONE-ACETAMINOPHEN 5-325 MG PO TABS
1.0000 | ORAL_TABLET | Freq: Once | ORAL | Status: AC
  Administered 2024-07-11: 1 via ORAL
  Filled 2024-07-11: qty 1

## 2024-07-11 NOTE — ED Provider Triage Note (Signed)
 Emergency Medicine Provider Triage Evaluation Note  Christy Fletcher , a 83 y.o. female  was evaluated in triage.  Pt complains of hip pain after a fall in her driveway tonight.   Review of Systems  Positive: Hip pain Negative: Headache, nausea  Physical Exam  BP (!) 155/91   Pulse 73   Temp 97.9 F (36.6 C) (Oral)   Resp 18   Ht 5' 1 (1.549 m)   Wt 55.3 kg   SpO2 97%   BMI 23.04 kg/m  Gen:   Awake, no distress   Resp:  Normal effort  MSK:   Tenderness to palpation of right hip     Medical Decision Making  Medically screening exam initiated at 10:46 PM.  Appropriate orders placed.  Christy Fletcher was informed that the remainder of the evaluation will be completed by another provider, this initial triage assessment does not replace that evaluation, and the importance of remaining in the ED until their evaluation is complete.     Gennaro Duwaine CROME, DO 07/11/24 2246

## 2024-07-11 NOTE — ED Triage Notes (Signed)
 Rcems from home. Cc of fall in her driveway. Got tripped up over her vehicle.  C/o right hip pain. Hit head but it does not hurt.

## 2024-07-12 ENCOUNTER — Inpatient Hospital Stay (HOSPITAL_COMMUNITY)

## 2024-07-12 ENCOUNTER — Emergency Department (HOSPITAL_COMMUNITY)

## 2024-07-12 ENCOUNTER — Encounter (HOSPITAL_COMMUNITY): Payer: Self-pay | Admitting: Family Medicine

## 2024-07-12 DIAGNOSIS — Z8673 Personal history of transient ischemic attack (TIA), and cerebral infarction without residual deficits: Secondary | ICD-10-CM | POA: Diagnosis not present

## 2024-07-12 DIAGNOSIS — Z888 Allergy status to other drugs, medicaments and biological substances status: Secondary | ICD-10-CM | POA: Diagnosis not present

## 2024-07-12 DIAGNOSIS — M978XXA Periprosthetic fracture around other internal prosthetic joint, initial encounter: Secondary | ICD-10-CM | POA: Diagnosis not present

## 2024-07-12 DIAGNOSIS — E785 Hyperlipidemia, unspecified: Secondary | ICD-10-CM

## 2024-07-12 DIAGNOSIS — Z96649 Presence of unspecified artificial hip joint: Secondary | ICD-10-CM | POA: Diagnosis not present

## 2024-07-12 DIAGNOSIS — I6782 Cerebral ischemia: Secondary | ICD-10-CM | POA: Diagnosis not present

## 2024-07-12 DIAGNOSIS — Z89422 Acquired absence of other left toe(s): Secondary | ICD-10-CM | POA: Diagnosis not present

## 2024-07-12 DIAGNOSIS — D72829 Elevated white blood cell count, unspecified: Secondary | ICD-10-CM | POA: Diagnosis not present

## 2024-07-12 DIAGNOSIS — S0990XA Unspecified injury of head, initial encounter: Secondary | ICD-10-CM | POA: Diagnosis not present

## 2024-07-12 DIAGNOSIS — Z95818 Presence of other cardiac implants and grafts: Secondary | ICD-10-CM | POA: Diagnosis not present

## 2024-07-12 DIAGNOSIS — Y92008 Other place in unspecified non-institutional (private) residence as the place of occurrence of the external cause: Secondary | ICD-10-CM | POA: Diagnosis not present

## 2024-07-12 DIAGNOSIS — M9701XA Periprosthetic fracture around internal prosthetic right hip joint, initial encounter: Secondary | ICD-10-CM | POA: Diagnosis not present

## 2024-07-12 DIAGNOSIS — E440 Moderate protein-calorie malnutrition: Secondary | ICD-10-CM | POA: Diagnosis not present

## 2024-07-12 DIAGNOSIS — S72111A Displaced fracture of greater trochanter of right femur, initial encounter for closed fracture: Secondary | ICD-10-CM | POA: Diagnosis not present

## 2024-07-12 DIAGNOSIS — W010XXA Fall on same level from slipping, tripping and stumbling without subsequent striking against object, initial encounter: Secondary | ICD-10-CM | POA: Diagnosis not present

## 2024-07-12 DIAGNOSIS — Z79899 Other long term (current) drug therapy: Secondary | ICD-10-CM | POA: Diagnosis not present

## 2024-07-12 DIAGNOSIS — Z96641 Presence of right artificial hip joint: Secondary | ICD-10-CM | POA: Diagnosis not present

## 2024-07-12 DIAGNOSIS — E7800 Pure hypercholesterolemia, unspecified: Secondary | ICD-10-CM | POA: Diagnosis not present

## 2024-07-12 DIAGNOSIS — M8580 Other specified disorders of bone density and structure, unspecified site: Secondary | ICD-10-CM | POA: Diagnosis not present

## 2024-07-12 DIAGNOSIS — S7291XA Unspecified fracture of right femur, initial encounter for closed fracture: Secondary | ICD-10-CM | POA: Diagnosis not present

## 2024-07-12 DIAGNOSIS — S72001A Fracture of unspecified part of neck of right femur, initial encounter for closed fracture: Secondary | ICD-10-CM | POA: Diagnosis not present

## 2024-07-12 DIAGNOSIS — Z6823 Body mass index (BMI) 23.0-23.9, adult: Secondary | ICD-10-CM | POA: Diagnosis not present

## 2024-07-12 DIAGNOSIS — M1711 Unilateral primary osteoarthritis, right knee: Secondary | ICD-10-CM | POA: Diagnosis not present

## 2024-07-12 LAB — BASIC METABOLIC PANEL WITH GFR
Anion gap: 11 (ref 5–15)
BUN: 15 mg/dL (ref 8–23)
CO2: 24 mmol/L (ref 22–32)
Calcium: 8.8 mg/dL — ABNORMAL LOW (ref 8.9–10.3)
Chloride: 104 mmol/L (ref 98–111)
Creatinine, Ser: 0.78 mg/dL (ref 0.44–1.00)
GFR, Estimated: >60 mL/min (ref >60)
Glucose, Bld: 121 mg/dL — ABNORMAL HIGH (ref 70–99)
Potassium: 3.5 mmol/L (ref 3.5–5.1)
Sodium: 138 mmol/L (ref 135–145)

## 2024-07-12 LAB — CBC
HCT: 37.2 % (ref 36.0–46.0)
Hemoglobin: 12.1 g/dL (ref 12.0–15.0)
MCH: 30.6 pg (ref 26.0–34.0)
MCHC: 32.5 g/dL (ref 30.0–36.0)
MCV: 93.9 fL (ref 80.0–100.0)
Platelets: 179 K/uL (ref 150–400)
RBC: 3.96 MIL/uL (ref 3.87–5.11)
RDW: 13.2 % (ref 11.5–15.5)
WBC: 14 K/uL — ABNORMAL HIGH (ref 4.0–10.5)
nRBC: 0 % (ref 0.0–0.2)

## 2024-07-12 LAB — CBG MONITORING, ED: Glucose-Capillary: 143 mg/dL — ABNORMAL HIGH (ref 70–99)

## 2024-07-12 MED ORDER — SODIUM CHLORIDE 0.9 % IV BOLUS
500.0000 mL | Freq: Once | INTRAVENOUS | Status: AC
  Administered 2024-07-12: 500 mL via INTRAVENOUS

## 2024-07-12 MED ORDER — SODIUM CHLORIDE 0.9 % IV SOLN: INTRAVENOUS | Status: AC

## 2024-07-12 MED ORDER — OXYCODONE HCL 5 MG PO TABS
5.0000 mg | ORAL_TABLET | ORAL | Status: DC | PRN
  Administered 2024-07-12 (×2): 5 mg via ORAL
  Filled 2024-07-12 (×2): qty 1

## 2024-07-12 MED ORDER — SODIUM CHLORIDE 0.45 % IV SOLN: INTRAVENOUS | Status: DC

## 2024-07-12 MED ORDER — PROCHLORPERAZINE EDISYLATE 10 MG/2ML IJ SOLN: 5.0000 mg | Freq: Four times a day (QID) | INTRAMUSCULAR | Status: DC | PRN

## 2024-07-12 MED ORDER — MORPHINE SULFATE (PF) 2 MG/ML IV SOLN: 1.0000 mg | INTRAVENOUS | Status: DC | PRN

## 2024-07-12 MED ORDER — PRAVASTATIN SODIUM 40 MG PO TABS
80.0000 mg | ORAL_TABLET | Freq: Every day | ORAL | Status: DC
  Administered 2024-07-12: 80 mg via ORAL
  Filled 2024-07-12: qty 2

## 2024-07-12 MED ORDER — SENNOSIDES-DOCUSATE SODIUM 8.6-50 MG PO TABS: 1.0000 | ORAL_TABLET | Freq: Every evening | ORAL | Status: DC | PRN

## 2024-07-12 MED ORDER — SODIUM CHLORIDE 0.9 % IV SOLN: INTRAVENOUS | Status: DC

## 2024-07-12 MED ORDER — METHOCARBAMOL 500 MG PO TABS: 500.0000 mg | ORAL_TABLET | Freq: Four times a day (QID) | ORAL | Status: DC | PRN

## 2024-07-12 MED ORDER — METHOCARBAMOL 500 MG PO TABS
500.0000 mg | ORAL_TABLET | Freq: Three times a day (TID) | ORAL | Status: DC | PRN
Start: 2024-07-12 — End: 2024-07-13

## 2024-07-12 MED ORDER — INFLUENZA VAC SPLIT HIGH-DOSE 0.5 ML IM SUSY: 0.5000 mL | PREFILLED_SYRINGE | INTRAMUSCULAR | Status: DC

## 2024-07-12 MED ORDER — METHOCARBAMOL 1000 MG/10ML IJ SOLN: 500.0000 mg | Freq: Four times a day (QID) | INTRAMUSCULAR | Status: DC | PRN

## 2024-07-12 MED ORDER — OXYCODONE HCL 5 MG PO TABS
5.0000 mg | ORAL_TABLET | Freq: Four times a day (QID) | ORAL | Status: DC | PRN
  Administered 2024-07-12: 5 mg via ORAL
  Filled 2024-07-12: qty 1

## 2024-07-12 MED ORDER — ACETAMINOPHEN 500 MG PO TABS
1000.0000 mg | ORAL_TABLET | Freq: Three times a day (TID) | ORAL | Status: DC
  Administered 2024-07-12 – 2024-07-13 (×2): 1000 mg via ORAL
  Filled 2024-07-12 (×2): qty 2
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
## 2024-07-12 NOTE — Progress Notes (Signed)
  Patient presented to the emergency department last night after a fall.  She has a history of right hip hemiarthroplasty with Dr. Barbarann, completed in 2019.  She presented with right hip pain.  Radiographs demonstrated a minimally displaced fracture of the greater trochanteric region of the right hip.  I recommended a CT scan for further evaluation.  Upon review of the CT scan, the stem continues to be well-fixed.  There is no concern for loosening at this time.  We will continue to monitor her closely.  I recommend toe-touch weightbearing with the assistance of a walker, at least for the first 2 weeks.  Okay to work with physical therapy.  We will schedule close follow-up.  Full consultation note to follow    Kizzy Olafson A. Onesimo, MD MS [facility] [address] [place],  [place]  [address] Phone: [phone] Fax: [phone]

## 2024-07-12 NOTE — Plan of Care (Signed)

## 2024-07-12 NOTE — Progress Notes (Signed)
 Initial Nutrition Assessment  DOCUMENTATION CODES:   Non-severe (moderate) malnutrition in context of chronic illness  INTERVENTION:   When diet advanced, add: Ensure Plus High Protein po BID, each supplement provides 350 kcal and 20 grams of protein MVI with minerals daily  NUTRITION DIAGNOSIS:   Moderate Malnutrition related to chronic illness as evidenced by mild fat depletion, mild muscle depletion, moderate muscle depletion, severe muscle depletion.  GOAL:   Patient will meet greater than or equal to 90% of their needs  MONITOR:   PO intake, Supplement acceptance  REASON FOR ASSESSMENT:   Consult Hip fracture protocol  ASSESSMENT:   83 yo female admitted with periprosthetic proximal right femur fracture S/P fall at home. PMH includes osteopenia, stroke, HLD, L second toe amputation.  Patient reports that she lost a lot of weight 2 years ago, but weight has been stable since then. She typically eats 3 meals per day and doesn't feel that she needs any nutrition supplements. She does take a MVI daily at home. She is currently NPO for possible surgery. She agreed to try PO supplements after her diet is advanced.    Weight history reviewed.  Weight has been stable over the past year.   Labs reviewed.  CBG: 143  Medications reviewed.  Patient meets criteria for moderate malnutrition, given mild depletion of muscle and subcutaneous fat mass.  NUTRITION - FOCUSED PHYSICAL EXAM:  Flowsheet Row Most Recent Value  Orbital Region Mild depletion  Upper Arm Region Mild depletion  Thoracic and Lumbar Region Mild depletion  Buccal Region Mild depletion  Temple Region Moderate depletion  Clavicle Bone Region Moderate depletion  Clavicle and Acromion Bone Region Moderate depletion  Scapular Bone Region Moderate depletion  Dorsal Hand Severe depletion  Patellar Region Mild depletion  Anterior Thigh Region Mild depletion  Posterior Calf Region Mild depletion  Edema (RD  Assessment) Mild  [RLE]  Hair Reviewed  Eyes Reviewed  Mouth Reviewed  Skin Reviewed  Nails Reviewed    Diet Order:   Diet Order             Diet NPO time specified Except for: Ice Chips, Sips with Meds  Diet effective now                   EDUCATION NEEDS:   Education needs have been addressed  Skin:  Skin Assessment: Reviewed RN Assessment  Last BM:  10/22  Height:   Ht Readings from Last 1 Encounters:  07/11/24 5' 1 (1.549 m)    Weight:   Wt Readings from Last 1 Encounters:  07/11/24 55.3 kg    Ideal Body Weight:  47.7 kg  BMI:  Body mass index is 23.04 kg/m.  Estimated Nutritional Needs:   Kcal:  1400-1600  Protein:  70-85 gm  Fluid:  1.5 L   Suzen HUNT RD, LDN, CNSC Contact via secure chat. If unavailable, use group chat RD Inpatient.

## 2024-07-12 NOTE — Discharge Instructions (Signed)
[facility] Stay Proper nutrition can help your body recover from illness and injury.   Foods and beverages high in protein, vitamins, and minerals help rebuild muscle loss, promote healing, & reduce fall risk.   In addition to eating healthy foods, a nutrition shake is an easy, delicious way to get the nutrition you need during and after your hospital stay  It is recommended that you continue to drink 2 bottles per day of:       Ensure Plus for at least 1 month (30 days) after your hospital stay   Tips for adding a nutrition shake into your routine: As allowed, drink one with vitamins or medications instead of water or juice Enjoy one as a tasty mid-morning or afternoon snack Drink cold or make a milkshake out of it Drink one instead of milk with cereal or snacks Use as a coffee creamer   Available at the following grocery stores and pharmacies:           * [place] [phone]            For COUPONS visit: www.ensure.com/join or [email or web address]   Suggested Substitutions Ensure Plus = Boost Plus = Carnation Breakfast Essentials = Boost Compact

## 2024-07-12 NOTE — Progress Notes (Signed)
 Patient seen and examined; admitted after midnight secondary to mechanical fall with right hip pain.  Patient with a prior history of CVA and hyperlipidemia.  Workup has demonstrated periprosthetic proximal right femur fracture.  Orthopedic surgery has been consulted and will see patient in consultation; will follow recommendations.  Please refer to H&P written by Dr. Charlton on 07/12/2024 for further info/details on admission.  Plan: - Continue as needed analgesics and supportive care. - Will keep patient n.p.o. for now except for ice chips and medications in case a surgical intervention will be done. - Follow clinical response. - After surgical intervention or orthopedic decision and/clearance physical therapy evaluation will be requested.  Eric Nunnery MD [phone]

## 2024-07-12 NOTE — ED Notes (Signed)
 Patient transported to CT

## 2024-07-12 NOTE — Consult Note (Signed)
 ORTHOPAEDIC CONSULTATION  REQUESTING PHYSICIAN: Ricky Fines, MD  ASSESSMENT AND PLAN: 83 y.o. female with the following: Right periprosthetic hip fracture  Patient sustained a minimally displaced right periprosthetic hip fracture.  Radiographs and CT scan confirmed that the stem remains in good position, with excellent ingrowth of bone onto the stem.  There is no obvious loosening.  As result, we can consider nonoperative management.  This has been discussed with the patient and family, who is available in the room.  She is in agreement with this plan.  Will plan to initiate therapy, she can bear up to 25% of her body weight for the first couple of weeks.  We will place a referral for physical therapy and Occupational Therapy.  - Weight Bearing Status/Activity: Less than 25% partial weightbearing on the right lower extremity.  Recommend use of a walker at all times.  - Additional recommended labs/tests: None  -VTE Prophylaxis: As needed; recommend aspirin  81 mg twice daily  - Pain control: As needed; judicious use of narcotics  - Follow-up plan: 2 weeks  -Procedures: None  Chief Complaint: Right hip pain  HPI: Christy Fletcher is a 83 y.o. female with past medical history as listed below, who presented to the emergency department after a mechanical fall.  She states that she fell in her driveway, and landed on cement.  Direct impact to the right hip.  She has a lot of pain, and and is unable to ambulate.  She does have a history of right hip hemiarthroplasty, completed by Dr. Delice in 2019.  She recovered well.  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  She remains active.  Past Medical History:  Diagnosis Date   Arthritis    Diarrhea    Hypercholesteremia    Osteopenia    Stroke [facility])    Past Surgical History:  Procedure Laterality Date   ABDOMINAL HYSTERECTOMY     AMPUTATION Left 04/01/2023   Procedure: LEFT 2ND TOE AMPUTATION;  Surgeon: Harden Jerona GAILS, MD;   Location: [facility] OR;  Service: Orthopedics;  Laterality: Left;   CATARACT EXTRACTION     COLONOSCOPY     COLONOSCOPY N/A 05/14/2013   Procedure: COLONOSCOPY;  Surgeon: Margo LITTIE Haddock, MD;  Location: [facility] ENDO SUITE;  Service: Endoscopy;  Laterality: N/A;  10:30 AM-rescheduled to 8:45am Doris notified pt   HERNIA REPAIR     HIP ARTHROPLASTY Right 11/20/2017   Procedure: RIGHT HIP HEMIARTHROPLASTY;  Surgeon: Barbarann Oneil BROCKS, MD;  Location: [facility] OR;  Service: Orthopedics;  Laterality: Right;   LOOP RECORDER INSERTION N/A 11/24/2017   Procedure: LOOP RECORDER INSERTION;  Surgeon: Inocencio Soyla Lunger, MD;  Location: [facility] INVASIVE CV LAB;  Service: Cardiovascular;  Laterality: N/A;   TEE WITHOUT CARDIOVERSION N/A 11/24/2017   Procedure: TRANSESOPHAGEAL ECHOCARDIOGRAM (TEE) WITH LOOP;  Surgeon: Rolan Ezra GORMAN, MD;  Location: [facility] ENDOSCOPY;  Service: Cardiovascular;  Laterality: N/A;   Social History   Socioeconomic History   Marital status: Married    Spouse name: Not on file   Number of children: Not on file   Years of education: Not on file   Highest education level: Not on file  Occupational History   Not on file  Tobacco Use   Smoking status: Never   Smokeless tobacco: Never  Vaping Use   Vaping status: Never Used  Substance and Sexual Activity   Alcohol use: No   Drug use: No   Sexual activity: Not on file  Other Topics Concern  Not on file  Social History Narrative   Not on file   Social Drivers of Health   Financial Resource Strain: Not on file  Food Insecurity: No Food Insecurity (07/12/2024)   Hunger Vital Sign    Worried About Running Out of Food in the Last Year: Never true    Ran Out of Food in the Last Year: Never true  Transportation Needs: No Transportation Needs (07/12/2024)   PRAPARE - Administrator, Civil Service (Medical): No    Lack of Transportation (Non-Medical): No  Physical Activity: Not on file  Stress: Not on file  Social Connections: Socially  Integrated (07/12/2024)   Social Connection and Isolation Panel    Frequency of Communication with Friends and Family: More than three times a week    Frequency of Social Gatherings with Friends and Family: More than three times a week    Attends Religious Services: More than 4 times per year    Active Member of [employer] or Organizations: Yes    Attends Engineer, structural: More than 4 times per year    Marital Status: Married   Family History  Problem Relation Age of Onset   Colon cancer Neg Hx    Allergies  Allergen Reactions   Plavix  [Clopidogrel  Bisulfate]     Unknown reaction   Prior to Admission medications   Medication Sig Start Date End Date Taking? Authorizing Provider  acetaminophen  (TYLENOL ) 500 MG tablet Take 1,000 mg by mouth every 6 (six) hours as needed for moderate pain.   Yes [provider]  Calcium  Carb-Cholecalciferol (CALCIUM  + VITAMIN D3 PO) Take by mouth.   Yes [provider]  lovastatin  (MEVACOR ) 40 MG tablet Take 2 tablets (80 mg total) by mouth at bedtime. 02/13/24  Yes Melvenia Manus BRAVO, MD   CT HEAD WO CONTRAST ( ) Result Date: 07/12/2024 CLINICAL DATA:  Recent fall with head injury, initial encounter EXAM: CT HEAD WITHOUT CONTRAST TECHNIQUE: Contiguous axial images were obtained from the base of the skull through the vertex without intravenous contrast. RADIATION DOSE REDUCTION: This exam was performed according to the departmental dose-optimization program which includes automated exposure control, adjustment of the mA and/or kV according to patient size and/or use of iterative reconstruction technique. COMPARISON:  05/27/2021 FINDINGS: Brain: No evidence of acute infarction, hemorrhage, hydrocephalus, extra-axial collection or mass lesion/mass effect. Mild atrophic changes are noted. Old lacunar infarct is noted in the anterior limb of the right internal capsule. Vascular: No hyperdense vessel or unexpected calcification. Skull:  Normal. Negative for fracture or focal lesion. Sinuses/Orbits: Orbits and their contents are within normal limits. Paranasal sinuses are unremarkable. Other: There is a defect noted in the anterior aspect of the C1 ring as well as in the left lamina. These appear well corticated and chronic in nature although incompletely evaluated on this exam. CT of the cervical spine is recommended for further evaluation. IMPRESSION: Chronic atrophic and ischemic changes. Lucent defects in the anterior ring of C1 as well as in the left lamina posteriorly. These are incompletely evaluated on this exam but appear chronic in nature. CT of the cervical spine is recommended for further evaluation. Electronically Signed   By: Oneil Devonshire M.D.   On: 07/12/2024 02:17   CT Hip Right Wo Contrast Result Date: 07/12/2024 CLINICAL DATA:  Known right periprosthetic femoral fracture EXAM: CT OF THE RIGHT HIP WITHOUT CONTRAST TECHNIQUE: Multidetector CT imaging of the right hip was performed according to the standard protocol. Multiplanar CT  image reconstructions were also generated. RADIATION DOSE REDUCTION: This exam was performed according to the departmental dose-optimization program which includes automated exposure control, adjustment of the mA and/or kV according to patient size and/or use of iterative reconstruction technique. COMPARISON:  Plain film from the previous day FINDINGS: Bones/Joint/Cartilage Right hip replacement is again seen. The superior and inferior pubic rami appear intact. A periprosthetic fracture of the proximal femoral shaft is noted extending from the level of the greater trochanter into the proximal shaft just below the intratrochanteric region. No other fracture is seen. Ligaments Suboptimally assessed by CT. Muscles and Tendons Surrounding musculature appears within normal limits. Soft tissues No soft tissue hematoma is noted. IMPRESSION: Known right periprosthetic proximal femoral fracture. The overall  appearance is similar to that seen on recent plain film. Electronically Signed   By: Oneil Devonshire M.D.   On: 07/12/2024 01:19   DG Tibia/Fibula Right Result Date: 07/11/2024 CLINICAL DATA:  Known proximal right femoral fracture, evaluate for distal abnormality. EXAM: RIGHT TIBIA AND FIBULA - 2 VIEW COMPARISON:  None Available. FINDINGS: Tricompartmental degenerative changes of the knee joint are seen. No acute fracture or dislocation is noted. No soft tissue abnormality is seen. IMPRESSION: No acute abnormality noted. Electronically Signed   By: Oneil Devonshire M.D.   On: 07/11/2024 23:44   DG Femur Min 2 Views Right Result Date: 07/11/2024 CLINICAL DATA:  Known right periprostatic femoral fracture, evaluate for more distal abnormality. EXAM: RIGHT FEMUR 2 VIEWS COMPARISON:  None Available. FINDINGS: Tricompartmental degenerative changes of the right knee joint are seen. No distal femoral fracture is seen. No other focal abnormality is noted. IMPRESSION: Knee degenerative changes without acute distal femoral fracture. Electronically Signed   By: Oneil Devonshire M.D.   On: 07/11/2024 23:44   DG Hip Unilat W or Wo Pelvis 2-3 Views Right Result Date: 07/11/2024 CLINICAL DATA:  Clemens onto right hip EXAM: DG HIP (WITH OR WITHOUT PELVIS) 2-3V RIGHT COMPARISON:  12/23/2020 FINDINGS: Frontal view of the pelvis as well as frontal and cross-table lateral views of the right hip are obtained. There is a right hip hemiarthroplasty, with a comminuted periprosthetic fracture involving the proximal right femur. Mild displacement of fracture fragments. No other acute displaced fractures. Left hip is unremarkable. IMPRESSION: 1. Periprosthetic proximal right femoral fracture. Electronically Signed   By: Ozell Daring M.D.   On: 07/11/2024 23:04   Family History Reviewed and non-contributory, no pertinent history of problems with bleeding or anesthesia    Review of Systems No fevers or chills No numbness or tingling No  chest pain No shortness of breath No bowel or bladder dysfunction No GI distress No headaches    OBJECTIVE  Vitals:Patient Vitals for the past 8 hrs:  BP Temp Temp src Pulse Resp SpO2  07/12/24 2001 (!) 116/52 97.9 F (36.6 C) Oral 80 18 95 %  07/12/24 1335 109/60 97.6 F (36.4 C) Oral 70 20 99 %   General: Alert, no acute distress Cardiovascular: Warm extremities noted Respiratory: No cyanosis, no use of accessory musculature GI: No organomegaly, abdomen is soft and non-tender Skin: No lesions in the area of chief complaint other than those listed below in MSK exam.  Neurologic: Sensation intact distally save for the below mentioned MSK exam Psychiatric: Patient is competent for consent with normal mood and affect Lymphatic: No swelling obvious and reported other than the area involved in the exam below Extremities   RLE: Right hip without obvious deformity.  There is some bruising  and swelling appreciated.  Tenderness to palpation.  Range of motion is deferred.  2+ DP pulse.  Toes warm and well-perfused.  Active motion is intact.  Previous surgical incision is healed. LLE: No deformity of the left hip.  No tenderness.  Range of motion is somewhat limited due to pain.  Finger warm and well-perfused.    Test Results Imaging  X-rays CT scan of the right hip demonstrates a right hip hemiarthroplasty in stable alignment.  There is no change in position.  There is no loosening of the distal portion of the stem.  Labs cbc Recent Labs    07/11/24 2326 07/12/24 0325  WBC 10.8* 14.0*  HGB 14.3 12.1  HCT 43.3 37.2  PLT 222 179     Recent Labs    07/11/24 2326 07/12/24 0325  NA 140 138  K 4.3 3.5  CL 102 104  CO2 28 24  GLUCOSE 100* 121*  BUN 14 15  CREATININE 0.87 0.78  CALCIUM  9.8 8.8*

## 2024-07-12 NOTE — Plan of Care (Signed)
  Problem: Education: Goal: Knowledge of General Education information will improve Description: Including pain rating scale, medication(s)/side effects and non-pharmacologic comfort measures Outcome: Progressing   Problem: Clinical Measurements: Goal: Cardiovascular complication will be avoided Outcome: Progressing   Problem: Coping: Goal: Level of anxiety will decrease Outcome: Progressing   Problem: Elimination: Goal: Will not experience complications related to urinary retention Outcome: Progressing   Problem: Pain Managment: Goal: General experience of comfort will improve and/or be controlled Outcome: Progressing   Problem: Safety: Goal: Ability to remain free from injury will improve Outcome: Progressing   Problem: Skin Integrity: Goal: Risk for impaired skin integrity will decrease Outcome: Progressing

## 2024-07-12 NOTE — TOC CM/SW Note (Signed)
 Transition of Care [facility]) - Inpatient Brief Assessment   Patient Details  Name: Christy Fletcher MRN: [id number] Date of Birth: 1941-07-23  Transition of Care [facility]) CM/SW Contact:    Lucie Lunger, LCSWA Phone Number: 07/12/2024, 10:44 AM   Clinical Narrative: CSW notes per chart review that Ortho is following pt for possible surgical needs. CSW notes TOC consult for SNF placement. Pt will likely have PT/OT eval once plan is made for surgical repair. TOC to follow for needs.   Transition of Care Asessment: Insurance and Status: Insurance coverage has been reviewed Patient has primary care physician: Yes Home environment has been reviewed: From home Prior level of function:: Independent Prior/Current Home Services: No current home services Social Drivers of Health Review: SDOH reviewed no interventions necessary Readmission risk has been reviewed: Yes Transition of care needs: transition of care needs identified, TOC will continue to follow

## 2024-07-13 DIAGNOSIS — E440 Moderate protein-calorie malnutrition: Secondary | ICD-10-CM

## 2024-07-13 DIAGNOSIS — M978XXA Periprosthetic fracture around other internal prosthetic joint, initial encounter: Secondary | ICD-10-CM | POA: Diagnosis not present

## 2024-07-13 DIAGNOSIS — E785 Hyperlipidemia, unspecified: Secondary | ICD-10-CM | POA: Diagnosis not present

## 2024-07-13 DIAGNOSIS — Z96649 Presence of unspecified artificial hip joint: Secondary | ICD-10-CM | POA: Diagnosis not present

## 2024-07-13 LAB — CBC
HCT: 31.5 % — ABNORMAL LOW (ref 36.0–46.0)
Hemoglobin: 10.3 g/dL — ABNORMAL LOW (ref 12.0–15.0)
MCH: 30.6 pg (ref 26.0–34.0)
MCHC: 32.7 g/dL (ref 30.0–36.0)
MCV: 93.5 fL (ref 80.0–100.0)
Platelets: 143 K/uL — ABNORMAL LOW (ref 150–400)
RBC: 3.37 MIL/uL — ABNORMAL LOW (ref 3.87–5.11)
RDW: 13.4 % (ref 11.5–15.5)
WBC: 8.2 K/uL (ref 4.0–10.5)
nRBC: 0 % (ref 0.0–0.2)

## 2024-07-13 LAB — BASIC METABOLIC PANEL WITH GFR
Anion gap: 7 (ref 5–15)
BUN: 10 mg/dL (ref 8–23)
CO2: 26 mmol/L (ref 22–32)
Calcium: 7.9 mg/dL — ABNORMAL LOW (ref 8.9–10.3)
Chloride: 104 mmol/L (ref 98–111)
Creatinine, Ser: 0.63 mg/dL (ref 0.44–1.00)
GFR, Estimated: >60 mL/min (ref >60)
Glucose, Bld: 121 mg/dL — ABNORMAL HIGH (ref 70–99)
Potassium: 3.9 mmol/L (ref 3.5–5.1)
Sodium: 137 mmol/L (ref 135–145)

## 2024-07-13 MED ORDER — OXYCODONE HCL 5 MG PO TABS: 5.0000 mg | ORAL_TABLET | Freq: Four times a day (QID) | ORAL | 0 refills | Status: AC | PRN

## 2024-07-13 MED ORDER — METHOCARBAMOL 500 MG PO TABS: 500.0000 mg | ORAL_TABLET | Freq: Three times a day (TID) | ORAL | 0 refills | Status: AC | PRN

## 2024-07-13 MED ORDER — ADULT MULTIVITAMIN W/MINERALS CH
1.0000 | ORAL_TABLET | Freq: Every day | ORAL | Status: DC
  Administered 2024-07-13: 1 via ORAL
  Filled 2024-07-13: qty 1

## 2024-07-13 MED ORDER — ENSURE PLUS HIGH PROTEIN PO LIQD
237.0000 mL | Freq: Two times a day (BID) | ORAL | Status: DC
  Administered 2024-07-13: 237 mL via ORAL

## 2024-07-13 MED ORDER — ENSURE PLUS HIGH PROTEIN PO LIQD: 237.0000 mL | Freq: Two times a day (BID) | ORAL | Status: AC

## 2024-07-13 MED ORDER — ADULT MULTIVITAMIN W/MINERALS CH: 1.0000 | ORAL_TABLET | Freq: Every day | ORAL | Status: AC

## 2024-07-13 MED ORDER — ACETAMINOPHEN 500 MG PO TABS: 1000.0000 mg | ORAL_TABLET | Freq: Three times a day (TID) | ORAL | Status: AC

## 2024-07-13 NOTE — ED Provider Notes (Signed)
 [facility] MEDICAL SURGICAL UNIT Provider Note   CSN: [id number] Arrival date & time: 07/11/24  2212     Patient presents with: Christy Fletcher is a 83 y.o. female.    presents with right hip pain following a fall earlier this evening. The incident occurred when she was retrieving bags from her truck, and the vehicle unexpectedly moved forward, causing her to fall while holding onto the back door handle. She landed on her right side, specifically impacting her hip, and reports pain extending from the hip down to her ankle, describing it as a shooting, shock-like pain. She denies any significant head injury despite a minor impact, and there is no loss of consciousness or use of blood thinners. Her medical history is notable for a right hip replacement approximately ten years ago. She is currently taking Lvastatin and does not regularly use pain medications, although she occasionally takes Tylenol . The patient was unable to stand independently after the fall and required assistance to get back to a seated position. She reports tenderness throughout the right leg, particularly when pressure is applied. History was obtained from the patient and her family.   Fall       Prior to Admission medications   Medication Sig Start Date End Date Taking? Authorizing Provider  acetaminophen  (TYLENOL ) 500 MG tablet Take 1,000 mg by mouth every 6 (six) hours as needed for moderate pain.   Yes [provider]  Calcium  Carb-Cholecalciferol (CALCIUM  + VITAMIN D3 PO) Take by mouth.   Yes [provider]  lovastatin  (MEVACOR ) 40 MG tablet Take 2 tablets (80 mg total) by mouth at bedtime. 02/13/24  Yes Melvenia Manus BRAVO, MD    Allergies: Plavix  [clopidogrel  bisulfate]    Review of Systems  Updated Vital Signs BP (!) 106/55   Pulse 78   Temp 97.8 F (36.6 C) (Oral)   Resp 18   Ht 5' 1 (1.549 m)   Wt 55.3 kg   SpO2 91%   BMI 23.04 kg/m   Physical Exam Vitals and  nursing note reviewed.  Constitutional:      Appearance: She is well-developed.  HENT:     Head: Normocephalic and atraumatic.  Cardiovascular:     Rate and Rhythm: Normal rate and regular rhythm.  Pulmonary:     Effort: No respiratory distress.     Breath sounds: No stridor.  Abdominal:     General: There is no distension.  Musculoskeletal:        General: Tenderness (Right hip and all of the right leg) present.     Cervical back: Normal range of motion.  Neurological:     Mental Status: She is alert.     (all labs ordered are listed, but only abnormal results are displayed) Labs Reviewed  CBC WITH DIFFERENTIAL/PLATELET - Abnormal; Notable for the following components:      Result Value   WBC 10.8 (*)    Neutro Abs 8.5 (*)    All other components within normal limits  COMPREHENSIVE METABOLIC PANEL WITH GFR - Abnormal; Notable for the following components:   Glucose, Bld 100 (*)    All other components within normal limits  CBC - Abnormal; Notable for the following components:   WBC 14.0 (*)    All other components within normal limits  BASIC METABOLIC PANEL WITH GFR - Abnormal; Notable for the following components:   Glucose, Bld 121 (*)    Calcium  8.8 (*)    All other components  within normal limits  CBC - Abnormal; Notable for the following components:   RBC 3.37 (*)    Hemoglobin 10.3 (*)    HCT 31.5 (*)    Platelets 143 (*)    All other components within normal limits  BASIC METABOLIC PANEL WITH GFR - Abnormal; Notable for the following components:   Glucose, Bld 121 (*)    Calcium  7.9 (*)    All other components within normal limits  CBG MONITORING, ED - Abnormal; Notable for the following components:   Glucose-Capillary 143 (*)    All other components within normal limits    EKG: None  Radiology: CT HEAD WO CONTRAST ( ) Result Date: 07/12/2024 CLINICAL DATA:  Recent fall with head injury, initial encounter EXAM: CT HEAD WITHOUT CONTRAST TECHNIQUE:  Contiguous axial images were obtained from the base of the skull through the vertex without intravenous contrast. RADIATION DOSE REDUCTION: This exam was performed according to the departmental dose-optimization program which includes automated exposure control, adjustment of the mA and/or kV according to patient size and/or use of iterative reconstruction technique. COMPARISON:  05/27/2021 FINDINGS: Brain: No evidence of acute infarction, hemorrhage, hydrocephalus, extra-axial collection or mass lesion/mass effect. Mild atrophic changes are noted. Old lacunar infarct is noted in the anterior limb of the right internal capsule. Vascular: No hyperdense vessel or unexpected calcification. Skull: Normal. Negative for fracture or focal lesion. Sinuses/Orbits: Orbits and their contents are within normal limits. Paranasal sinuses are unremarkable. Other: There is a defect noted in the anterior aspect of the C1 ring as well as in the left lamina. These appear well corticated and chronic in nature although incompletely evaluated on this exam. CT of the cervical spine is recommended for further evaluation. IMPRESSION: Chronic atrophic and ischemic changes. Lucent defects in the anterior ring of C1 as well as in the left lamina posteriorly. These are incompletely evaluated on this exam but appear chronic in nature. CT of the cervical spine is recommended for further evaluation. Electronically Signed   By: Oneil Devonshire M.D.   On: 07/12/2024 02:17   CT Hip Right Wo Contrast Result Date: 07/12/2024 CLINICAL DATA:  Known right periprosthetic femoral fracture EXAM: CT OF THE RIGHT HIP WITHOUT CONTRAST TECHNIQUE: Multidetector CT imaging of the right hip was performed according to the standard protocol. Multiplanar CT image reconstructions were also generated. RADIATION DOSE REDUCTION: This exam was performed according to the departmental dose-optimization program which includes automated exposure control, adjustment of the mA  and/or kV according to patient size and/or use of iterative reconstruction technique. COMPARISON:  Plain film from the previous day FINDINGS: Bones/Joint/Cartilage Right hip replacement is again seen. The superior and inferior pubic rami appear intact. A periprosthetic fracture of the proximal femoral shaft is noted extending from the level of the greater trochanter into the proximal shaft just below the intratrochanteric region. No other fracture is seen. Ligaments Suboptimally assessed by CT. Muscles and Tendons Surrounding musculature appears within normal limits. Soft tissues No soft tissue hematoma is noted. IMPRESSION: Known right periprosthetic proximal femoral fracture. The overall appearance is similar to that seen on recent plain film. Electronically Signed   By: Oneil Devonshire M.D.   On: 07/12/2024 01:19   DG Tibia/Fibula Right Result Date: 07/11/2024 CLINICAL DATA:  Known proximal right femoral fracture, evaluate for distal abnormality. EXAM: RIGHT TIBIA AND FIBULA - 2 VIEW COMPARISON:  None Available. FINDINGS: Tricompartmental degenerative changes of the knee joint are seen. No acute fracture or dislocation is noted. No soft tissue  abnormality is seen. IMPRESSION: No acute abnormality noted. Electronically Signed   By: Oneil Devonshire M.D.   On: 07/11/2024 23:44   DG Femur Min 2 Views Right Result Date: 07/11/2024 CLINICAL DATA:  Known right periprostatic femoral fracture, evaluate for more distal abnormality. EXAM: RIGHT FEMUR 2 VIEWS COMPARISON:  None Available. FINDINGS: Tricompartmental degenerative changes of the right knee joint are seen. No distal femoral fracture is seen. No other focal abnormality is noted. IMPRESSION: Knee degenerative changes without acute distal femoral fracture. Electronically Signed   By: Oneil Devonshire M.D.   On: 07/11/2024 23:44   DG Hip Unilat W or Wo Pelvis 2-3 Views Right Result Date: 07/11/2024 CLINICAL DATA:  Clemens onto right hip EXAM: DG HIP (WITH OR WITHOUT  PELVIS) 2-3V RIGHT COMPARISON:  12/23/2020 FINDINGS: Frontal view of the pelvis as well as frontal and cross-table lateral views of the right hip are obtained. There is a right hip hemiarthroplasty, with a comminuted periprosthetic fracture involving the proximal right femur. Mild displacement of fracture fragments. No other acute displaced fractures. Left hip is unremarkable. IMPRESSION: 1. Periprosthetic proximal right femoral fracture. Electronically Signed   By: Ozell Daring M.D.   On: 07/11/2024 23:04     Procedures   Medications Ordered in the ED  pravastatin  (PRAVACHOL ) tablet 80 mg (80 mg Oral Given 07/12/24 1634)  senna-docusate (Senokot-S) tablet 1 tablet (has no administration in time range)  prochlorperazine (COMPAZINE) injection 5 mg (has no administration in time range)  0.9 %  sodium chloride  infusion (0 mLs Intravenous Stopped 07/12/24 1621)  Influenza vac split trivalent PF (FLUZONE HIGH-DOSE) injection 0.5 mL (has no administration in time range)  oxyCODONE  (Oxy IR/ROXICODONE ) immediate release tablet 5 mg (5 mg Oral Given 07/12/24 2114)  morphine  (PF) 2 MG/ML injection 1-2 mg (has no administration in time range)  acetaminophen  (TYLENOL ) tablet 1,000 mg (1,000 mg Oral Given 07/12/24 2114)  methocarbamol (ROBAXIN) tablet 500 mg (has no administration in time range)  oxyCODONE -acetaminophen  (PERCOCET/ROXICET) 5-325 MG per tablet 1 tablet (1 tablet Oral Given 07/11/24 2316)  sodium chloride  0.9 % bolus 500 mL (0 mLs Intravenous Stopped 07/12/24 0209)                                    Medical Decision Making Amount and/or Complexity of Data Reviewed Labs: ordered. Radiology: ordered.  Risk Prescription drug management. Decision regarding hospitalization.   Patient periprosthetic fracture.  Pain controlled at rest but cannot move without severe side pain.  Discussed with Dr. onesimo since her orthopedist in [place] retired.  He asked for a CT scan to see if that  the hardware was loose or not and he will follow-up in the morning.  Discussed with hospitalist for admission for physical therapy and further pain control. Final diagnoses:  Closed fracture of right hip, initial encounter [facility])    ED Discharge Orders     None          Eleri Ruben, Selinda, MD 07/13/24 [phone]

## 2024-07-13 NOTE — Evaluation (Signed)
 Physical Therapy Evaluation Patient Details Name: Christy Fletcher MRN: [id number] DOB: 09-14-1941 Today's Date: 07/13/2024  History of Present Illness  Christy Fletcher is a 83 y.o. female with medical history significant for hyperlipidemia, osteomyelitis status post amputation of the left second toe, and ischemic stroke following hip surgery in 2019 who presents with severe right hip pain after a trip and fall at home.     Patient was in her usual state and had had an uneventful day when she got out of a vehicle in her driveway and was opening the back passenger door when the vehicle rolled back, causing her to trip and fall onto her right side.  She reports hitting her head lightly but denies any headache or any acute neurologic change.  She does not have any known history of heart disease or chronic lung disease.  She had a Linq monitor implanted after her stroke in 2019 that never revealed any atrial fibrillation.   Clinical Impression  Pt. Presented with generalized LE, and RLE pain due to Periprosthetic hip fx.Pt. required Min A for bed mobility to get to EOB, with transferring from sit to stand and ambulation pt RLE was TWB and required Min A with RW for stability. Nursing staff was notified on the status of pt.  PLAN:  Patient to be discharged home today and discharged from acute physical therapy to care of nursing for OOB as tolerated for length of stay.      If plan is discharge home, recommend the following: A little help with bathing/dressing/bathroom;Assist for transportation;Assistance with cooking/housework;A lot of help with walking and/or transfers   Can travel by private vehicle    Yes    Equipment Recommendations None recommended by PT  Recommendations for Other Services       Functional Status Assessment Patient has had a recent decline in their functional status and demonstrates the ability to make significant improvements in function in a reasonable and predictable  amount of time.     Precautions / Restrictions Precautions Precautions: Fall (Periprosthetic Hip fx, Initial encounter) Recall of Precautions/Restrictions: Intact Restrictions Weight Bearing Restrictions Per Provider Order: Yes RLE Weight Bearing Per Provider Order: Touchdown weight bearing      Mobility  Bed Mobility Overal bed mobility: Needs Assistance Bed Mobility: Supine to Sit, Sit to Supine     Supine to sit: Min assist, Mod assist Sit to supine: Min assist   General bed mobility comments: slowed and labor to EOB    Transfers Overall transfer level: Needs assistance Equipment used: Rolling walker (2 wheels) Transfers: Sit to/from Stand, Bed to chair/wheelchair/BSC Sit to Stand: Min assist, Contact guard assist   Step pivot transfers: Min assist       General transfer comment: Pt. used RW for transfer, w/ TWB    Ambulation/Gait Ambulation/Gait assistance: Min assist, Contact guard assist Gait Distance (Feet): 10 Feet Assistive device: Rolling walker (2 wheels) Gait Pattern/deviations: Decreased step length - right, Decreased step length - left, Decreased stance time - right, Decreased stance time - left, Decreased stride length, Antalgic, Narrow base of support       General Gait Details: Pt. ambulated with RW from bed to chair, and did have diffculty time navigating with the stronger LE ( LLE) to get to chair. Due to the RLE to be [facility]  Stairs            Wheelchair Mobility     Tilt Bed    Modified Rankin (Stroke Patients Only)  Balance Overall balance assessment: Needs assistance Sitting-balance support: Bilateral upper extremity supported Sitting balance-Leahy Scale: Fair Sitting balance - Comments: Fair pt .needed CGA   Standing balance support: During functional activity, Reliant on assistive device for balance Standing balance-Leahy Scale: Poor Standing balance comment: Poor due to TWB RLE                              Pertinent Vitals/Pain Pain Assessment Pain Assessment: 0-10 Pain Score: 7  Pain Location: R Hip, pain when standing Pain Descriptors / Indicators: Discomfort Pain Intervention(s): Monitored during session, Repositioned    Home Living Family/patient expects to be discharged to:: Private residence Living Arrangements: Spouse/significant other Available Help at Discharge: Family;Available 24 hours/day Type of Home: Apartment Home Access: Ramped entrance       Home Layout: One level Home Equipment: Cane - single point;Rolling Walker (2 wheels);Grab bars - toilet;Grab bars - tub/shower Additional Comments: Pt. walk-in shower, is big enoguth to put a RW and WC.    Prior Function Prior Level of Function : Needs assist       Physical Assist : Mobility (physical);ADLs (physical) Mobility (physical): Bed mobility;Transfers;Gait;Stairs ADLs (physical): Bathing Mobility Comments: Pt. family is able to assist with transfers, and bed mobility. ADLs Comments: Pt. family is able to help with ADLs and bathing, and grooming if neccessary     Extremity/Trunk Assessment        Lower Extremity Assessment Lower Extremity Assessment: Generalized weakness;RLE deficits/detail RLE Deficits / Details: Pt. has pain in the hip, and TWB RLE Sensation: WNL RLE Coordination: decreased gross motor    Cervical / Trunk Assessment Cervical / Trunk Assessment: Normal  Communication   Communication Communication: No apparent difficulties    Cognition Arousal: Alert Behavior During Therapy: WFL for tasks assessed/performed   PT - Cognitive impairments: No apparent impairments                         Following commands: Intact       Cueing Cueing Techniques: Verbal cues, Tactile cues     General Comments      Exercises     Assessment/Plan    PT Assessment Patient does not need any further PT services  PT Problem List         PT Treatment Interventions      PT  Goals (Current goals can be found in the Care Plan section)  Acute Rehab PT Goals Patient Stated Goal: pt. wants to return home with family PT Goal Formulation: With patient Time For Goal Achievement: 07/14/24 Potential to Achieve Goals: Good    Frequency       Co-evaluation               AM-PAC PT 6 Clicks Mobility  Outcome Measure Help needed turning from your back to your side while in a flat bed without using bedrails?: A Little Help needed moving from lying on your back to sitting on the side of a flat bed without using bedrails?: A Lot Help needed moving to and from a bed to a chair (including a wheelchair)?: A Lot Help needed standing up from a chair using your arms (e.g., wheelchair or bedside chair)?: A Lot Help needed to walk in hospital room?: A Lot Help needed climbing 3-5 steps with a railing? : Total 6 Click Score: 12    End of Session   Activity Tolerance: Patient tolerated treatment  well;Patient limited by pain;Patient limited by fatigue Patient left: in chair;with family/visitor present;with call bell/phone within reach Nurse Communication: Mobility status PT Visit Diagnosis: Unsteadiness on feet (R26.81);Repeated falls (R29.6);Muscle weakness (generalized) (M62.81)    Time: 8896-8870 PT Time Calculation (min) (ACUTE ONLY): 26 min   Charges:   PT Evaluation $PT Eval Moderate Complexity: 1 Mod PT Treatments $Therapeutic Activity: 23-37 mins PT General Charges $$ ACUTE PT VISIT: 1 Visit         Jona Zappone, SPT

## 2024-07-13 NOTE — TOC Transition Note (Signed)
 Transition of Care [facility]) - Discharge Note   Patient Details  Name: Christy Fletcher MRN: [id number] Date of Birth: [birth year]  [facility] CM/SW Contact:  Lucie Lunger, LCSWA Phone Number: 07/13/2024, 12:51 PM   Clinical Narrative:    CSW updated that PT is recommending [facility] PT/OT for pt at D/C. CSW met with pt and daughter at bedside, pts son Oneil was on speaker phone also. Pt states she lives with her spouse. Pts home is handicap accessible and they have all needed DME. Pt and family agreeable to [facility] PT/OT being arranged and prefer [facility] if they are able to accept. CSW sent referral to [place] with [facility], CSW awaiting update at this time. HH agency will be updated to reach out to pts daughter for appoint scheduling. MD placed [facility] PT/OT orders. TOC signing off.   Final next level of care: Home w Home Health Services Barriers to Discharge: Barriers Resolved   Patient Goals and CMS Choice Patient states their goals for this hospitalization and ongoing recovery are:: return home CMS Medicare.gov Compare Post Acute Care list provided to:: Patient Choice offered to / list presented to : Patient, Adult Children      Discharge Placement                       Discharge Plan and Services Additional resources added to the After Visit Summary for                            [facility] Arranged: PT, OT          Social Drivers of Health (SDOH) Interventions SDOH Screenings   Food Insecurity: No Food Insecurity (07/12/2024)  Housing: Low Risk  (07/12/2024)  Transportation Needs: No Transportation Needs (07/12/2024)  Utilities: Not At Risk (07/12/2024)  Depression (PHQ2-9): Low Risk  (11/11/2023)  Social Connections: Socially Integrated (07/12/2024)  Tobacco Use: Low Risk  (07/12/2024)     Readmission Risk Interventions     No data to display

## 2024-07-13 NOTE — Discharge Summary (Signed)
 Physician Discharge Summary   Patient: Christy Fletcher MRN: [id number] DOB: [birth year]  Admit date:     07/11/2024  Discharge date: 07/13/24  Discharge Physician: Eric Nunnery   PCP: Melvenia Manus BRAVO, MD   Recommendations at discharge:  Repeat CBC to follow hemoglobin trend/stability Repeat basic metabolic panel to follow electrolytes and renal function. Reassess patient's pain management and for the prescribed medications as needed to continue controlling hip discomfort. Make sure patient follow-up with orthopedic service as recommended.   Discharge Diagnoses: Principal Problem:   Periprosthetic hip fracture, initial encounter Active Problems:   Dyslipidemia   History of stroke   Malnutrition of moderate degree   Brief Hospital admission narrative: As per H&P written by Dr. Charlton on 07/12/2024 Christy Fletcher is a 83 y.o. female with medical history significant for hyperlipidemia, osteomyelitis status post amputation of the left second toe, and ischemic stroke following hip surgery in 2019 who presents with severe right hip pain after a trip and fall at home.   Patient was in her usual state and had had an uneventful day when she got out of a vehicle in her driveway and was opening the back passenger door when the vehicle rolled back, causing her to trip and fall onto her right side.  She reports hitting her head lightly but denies any headache or any acute neurologic change.  She does not have any known history of heart disease or chronic lung disease.  She had a Linq monitor implanted after her stroke in 2019 that never revealed any atrial fibrillation.   ED Course: Upon arrival to the ED, patient is found to be afebrile and saturating well on room air with normal HR and stable BP.  CMP is normal and CBC notable for a mild leukocytosis.  Imaging reveals periprosthetic fracture of the proximal right femur.   Orthopedic surgery (Dr. Onesimo) was consulted by the ED physician and  the patient was given a dose of Percocet.  Assessment and Plan: 1-Mechanical fall with periprosthetic proximal right femur fracture - CT scan demonstrating stable fixation of previous prosthesis. - Per orthopedic service no surgical intervention currently required - Home health services with PT/OT has been arranged at discharge - Tylenol , Robaxin and oxycodone  prescribed to assist with pain management - Weightbearing less than 25% partial weightbearing on the right lower extremity recommended; per orthopedic service patient will require the use of walker at all times. - Outpatient follow-up in 2 weeks will be arranged.  2-moderate protein calorie malnutrition - Multivitamins and feeding supplement recommended - Patient advised to maintain adequate nutrition and hydration.  3-hyperlipidemia - Continue statin.  4-prior history of stroke - Continue Refacto modification - No acute deficits appreciated.   Consultants: Orthopedic service Procedures performed: See below for x-ray reports. Disposition: Home with home health services. Diet recommendation: Heart healthy diet.  DISCHARGE MEDICATION: Allergies as of 07/13/2024       Reactions   Plavix  [clopidogrel  Bisulfate]    Unknown reaction        Medication List     TAKE these medications    acetaminophen  500 MG tablet Commonly known as: TYLENOL  Take 2 tablets (1,000 mg total) by mouth in the morning, at noon, and at bedtime. What changed:  when to take this reasons to take this   CALCIUM  + VITAMIN D3 PO Take by mouth.   feeding supplement Liqd Take 237 mLs by mouth 2 (two) times daily between meals. Start taking on: July 14, 2024   lovastatin   40 MG tablet Commonly known as: MEVACOR  Take 2 tablets (80 mg total) by mouth at bedtime.   methocarbamol 500 MG tablet Commonly known as: ROBAXIN Take 1 tablet (500 mg total) by mouth every 8 (eight) hours as needed for muscle spasms.   multivitamin with minerals  Tabs tablet Take 1 tablet by mouth daily. Start taking on: July 14, 2024   oxyCODONE  5 MG immediate release tablet Commonly known as: Oxy IR/ROXICODONE  Take 1 tablet (5 mg total) by mouth every 6 (six) hours as needed for severe pain (pain score 7-10).        Follow-up Information     Melvenia Manus BRAVO, MD. Schedule an appointment as soon as possible for a visit in 10 day(s).   Specialty: Internal Medicine Contact information: [address] [place] [address] [phone]         Onesimo Oneil LABOR, MD. Schedule an appointment as soon as possible for a visit in 2 week(s).   Specialties: Orthopedic Surgery, Sports Medicine Contact information: [address] [place] [address] [phone]                Discharge Exam: Christy Fletcher   07/11/24 2217  Weight: 55.3 kg   General exam: Alert, awake, oriented x 3 Respiratory system: Clear to auscultation. Respiratory effort normal. Cardiovascular system:RRR. No murmurs, rubs, gallops. Gastrointestinal system: Abdomen is nondistended, soft and nontender. No organomegaly or masses felt. Normal bowel sounds heard. Central nervous system:  No focal neurological deficits. Extremities: No cyanosis or clubbing; complaining of right lower extremity pain with movement.  No edema. Skin: No petechiae. Psychiatry: Judgement and insight appear normal. Mood & affect appropriate.    Condition at discharge: Stable and improved.  The results of significant diagnostics from this hospitalization (including imaging, microbiology, ancillary and laboratory) are listed below for reference.   Imaging Studies: CT HEAD WO CONTRAST ( ) Result Date: 07/12/2024 CLINICAL DATA:  Recent fall with head injury, initial encounter EXAM: CT HEAD WITHOUT CONTRAST TECHNIQUE: Contiguous axial images were obtained from the base of the skull through the vertex without intravenous contrast. RADIATION DOSE REDUCTION: This exam was performed  according to the departmental dose-optimization program which includes automated exposure control, adjustment of the mA and/or kV according to patient size and/or use of iterative reconstruction technique. COMPARISON:  05/27/2021 FINDINGS: Brain: No evidence of acute infarction, hemorrhage, hydrocephalus, extra-axial collection or mass lesion/mass effect. Mild atrophic changes are noted. Old lacunar infarct is noted in the anterior limb of the right internal capsule. Vascular: No hyperdense vessel or unexpected calcification. Skull: Normal. Negative for fracture or focal lesion. Sinuses/Orbits: Orbits and their contents are within normal limits. Paranasal sinuses are unremarkable. Other: There is a defect noted in the anterior aspect of the C1 ring as well as in the left lamina. These appear well corticated and chronic in nature although incompletely evaluated on this exam. CT of the cervical spine is recommended for further evaluation. IMPRESSION: Chronic atrophic and ischemic changes. Lucent defects in the anterior ring of C1 as well as in the left lamina posteriorly. These are incompletely evaluated on this exam but appear chronic in nature. CT of the cervical spine is recommended for further evaluation. Electronically Signed   By: Oneil Devonshire M.D.   On: 07/12/2024 02:17   CT Hip Right Wo Contrast Result Date: 07/12/2024 CLINICAL DATA:  Known right periprosthetic femoral fracture EXAM: CT OF THE RIGHT HIP WITHOUT CONTRAST TECHNIQUE: Multidetector CT imaging of the right  hip was performed according to the standard protocol. Multiplanar CT image reconstructions were also generated. RADIATION DOSE REDUCTION: This exam was performed according to the departmental dose-optimization program which includes automated exposure control, adjustment of the mA and/or kV according to patient size and/or use of iterative reconstruction technique. COMPARISON:  Plain film from the previous day FINDINGS: Bones/Joint/Cartilage  Right hip replacement is again seen. The superior and inferior pubic rami appear intact. A periprosthetic fracture of the proximal femoral shaft is noted extending from the level of the greater trochanter into the proximal shaft just below the intratrochanteric region. No other fracture is seen. Ligaments Suboptimally assessed by CT. Muscles and Tendons Surrounding musculature appears within normal limits. Soft tissues No soft tissue hematoma is noted. IMPRESSION: Known right periprosthetic proximal femoral fracture. The overall appearance is similar to that seen on recent plain film. Electronically Signed   By: Oneil Devonshire M.D.   On: 07/12/2024 01:19   DG Tibia/Fibula Right Result Date: 07/11/2024 CLINICAL DATA:  Known proximal right femoral fracture, evaluate for distal abnormality. EXAM: RIGHT TIBIA AND FIBULA - 2 VIEW COMPARISON:  None Available. FINDINGS: Tricompartmental degenerative changes of the knee joint are seen. No acute fracture or dislocation is noted. No soft tissue abnormality is seen. IMPRESSION: No acute abnormality noted. Electronically Signed   By: Oneil Devonshire M.D.   On: 07/11/2024 23:44   DG Femur Min 2 Views Right Result Date: 07/11/2024 CLINICAL DATA:  Known right periprostatic femoral fracture, evaluate for more distal abnormality. EXAM: RIGHT FEMUR 2 VIEWS COMPARISON:  None Available. FINDINGS: Tricompartmental degenerative changes of the right knee joint are seen. No distal femoral fracture is seen. No other focal abnormality is noted. IMPRESSION: Knee degenerative changes without acute distal femoral fracture. Electronically Signed   By: Oneil Devonshire M.D.   On: 07/11/2024 23:44   DG Hip Unilat W or Wo Pelvis 2-3 Views Right Result Date: 07/11/2024 CLINICAL DATA:  Clemens onto right hip EXAM: DG HIP (WITH OR WITHOUT PELVIS) 2-3V RIGHT COMPARISON:  12/23/2020 FINDINGS: Frontal view of the pelvis as well as frontal and cross-table lateral views of the right hip are obtained. There  is a right hip hemiarthroplasty, with a comminuted periprosthetic fracture involving the proximal right femur. Mild displacement of fracture fragments. No other acute displaced fractures. Left hip is unremarkable. IMPRESSION: 1. Periprosthetic proximal right femoral fracture. Electronically Signed   By: Ozell Daring M.D.   On: 07/11/2024 23:04    Microbiology: Results for orders placed or performed during the hospital encounter of 03/26/23  Culture, blood (single)     Status: None   Collection Time: 03/26/23 10:13 AM   Specimen: BLOOD LEFT HAND  Result Value Ref Range Status   Specimen Description BLOOD LEFT HAND  Final   Special Requests   Final    BOTTLES DRAWN AEROBIC AND ANAEROBIC Blood Culture adequate volume   Culture   Final    NO GROWTH 5 DAYS Performed at [facility], [address] [place], [place] [address]    Report Status 03/31/2023 FINAL  Final    Labs: CBC: Recent Labs  Lab 07/11/24 2326 07/12/24 0325 07/13/24 0439  WBC 10.8* 14.0* 8.2  NEUTROABS 8.5*  --   --   HGB 14.3 12.1 10.3*  HCT 43.3 37.2 31.5*  MCV 92.1 93.9 93.5  PLT 222 179 143*   Basic Metabolic Panel: Recent Labs  Lab 07/11/24 2326 07/12/24 0325 07/13/24 0439  NA 140 138 137  K 4.3  3.5 3.9  CL 102 104 104  CO2 28 24 26   GLUCOSE 100* 121* 121*  BUN 14 15 10   CREATININE 0.87 0.78 0.63  CALCIUM  9.8 8.8* 7.9*   Liver Function Tests: Recent Labs  Lab 07/11/24 2326  AST 21  ALT 11  ALKPHOS 94  BILITOT 0.3  PROT 6.8  ALBUMIN 4.2   CBG: Recent Labs  Lab 07/12/24 0141  GLUCAP 143*    Discharge time spent:  35 minutes.  Signed: Eric Nunnery, MD [facility] 07/13/2024

## 2024-07-13 NOTE — Plan of Care (Signed)

## 2024-07-13 NOTE — Plan of Care (Signed)

## 2024-07-16 DIAGNOSIS — S72001D Fracture of unspecified part of neck of right femur, subsequent encounter for closed fracture with routine healing: Secondary | ICD-10-CM | POA: Diagnosis not present

## 2024-07-16 DIAGNOSIS — Z89422 Acquired absence of other left toe(s): Secondary | ICD-10-CM | POA: Diagnosis not present

## 2024-07-16 DIAGNOSIS — M9701XD Periprosthetic fracture around internal prosthetic right hip joint, subsequent encounter: Secondary | ICD-10-CM | POA: Diagnosis not present

## 2024-07-16 DIAGNOSIS — Z9181 History of falling: Secondary | ICD-10-CM | POA: Diagnosis not present

## 2024-07-16 DIAGNOSIS — E440 Moderate protein-calorie malnutrition: Secondary | ICD-10-CM | POA: Diagnosis not present

## 2024-07-16 DIAGNOSIS — I10 Essential (primary) hypertension: Secondary | ICD-10-CM | POA: Diagnosis not present

## 2024-07-18 ENCOUNTER — Telehealth: Payer: Self-pay

## 2024-07-18 NOTE — Telephone Encounter (Signed)
 Brad physical therapist with [facility] left message wanting a verbal for the following: 2 x for 3 weeks 1 x for 4 weeks   He stated he did PT Eval on 10/27  Please call him at [phone]

## 2024-07-19 NOTE — Telephone Encounter (Signed)
 Verbal order left on VM w/ callback in case of questions.

## 2024-07-20 DIAGNOSIS — M9701XD Periprosthetic fracture around internal prosthetic right hip joint, subsequent encounter: Secondary | ICD-10-CM | POA: Diagnosis not present

## 2024-07-20 DIAGNOSIS — E440 Moderate protein-calorie malnutrition: Secondary | ICD-10-CM | POA: Diagnosis not present

## 2024-07-20 DIAGNOSIS — Z89422 Acquired absence of other left toe(s): Secondary | ICD-10-CM | POA: Diagnosis not present

## 2024-07-20 DIAGNOSIS — Z9181 History of falling: Secondary | ICD-10-CM | POA: Diagnosis not present

## 2024-07-20 DIAGNOSIS — I10 Essential (primary) hypertension: Secondary | ICD-10-CM | POA: Diagnosis not present

## 2024-07-20 DIAGNOSIS — S72001D Fracture of unspecified part of neck of right femur, subsequent encounter for closed fracture with routine healing: Secondary | ICD-10-CM | POA: Diagnosis not present

## 2024-07-23 ENCOUNTER — Encounter: Payer: Self-pay | Admitting: Radiology

## 2024-07-24 ENCOUNTER — Telehealth: Payer: Self-pay

## 2024-07-24 DIAGNOSIS — M9701XD Periprosthetic fracture around internal prosthetic right hip joint, subsequent encounter: Secondary | ICD-10-CM | POA: Diagnosis not present

## 2024-07-24 DIAGNOSIS — Z89422 Acquired absence of other left toe(s): Secondary | ICD-10-CM | POA: Diagnosis not present

## 2024-07-24 DIAGNOSIS — E440 Moderate protein-calorie malnutrition: Secondary | ICD-10-CM | POA: Diagnosis not present

## 2024-07-24 DIAGNOSIS — I10 Essential (primary) hypertension: Secondary | ICD-10-CM | POA: Diagnosis not present

## 2024-07-24 DIAGNOSIS — Z9181 History of falling: Secondary | ICD-10-CM | POA: Diagnosis not present

## 2024-07-24 DIAGNOSIS — S72001D Fracture of unspecified part of neck of right femur, subsequent encounter for closed fracture with routine healing: Secondary | ICD-10-CM | POA: Diagnosis not present

## 2024-07-24 NOTE — Telephone Encounter (Signed)
 Rosaline OT therapist with [facility] left message needing a verbal for Home health OT for 2 times for 2 weeks and 1 time for 3 weeks.  Please call her at [phone]

## 2024-07-25 ENCOUNTER — Other Ambulatory Visit (INDEPENDENT_AMBULATORY_CARE_PROVIDER_SITE_OTHER): Payer: Self-pay

## 2024-07-25 ENCOUNTER — Ambulatory Visit: Admitting: Orthopedic Surgery

## 2024-07-25 DIAGNOSIS — Z96641 Presence of right artificial hip joint: Secondary | ICD-10-CM | POA: Diagnosis not present

## 2024-07-25 DIAGNOSIS — M978XXA Periprosthetic fracture around other internal prosthetic joint, initial encounter: Secondary | ICD-10-CM | POA: Diagnosis not present

## 2024-07-25 NOTE — Patient Instructions (Signed)
 Continue to use a walker to assist with ambulation  WBAT on the right leg  Follow up in 1 month.  Please call with issues

## 2024-07-25 NOTE — Telephone Encounter (Signed)
 Left VM w/ verbal order and callback information in case of questions or concerns.

## 2024-07-26 ENCOUNTER — Encounter: Payer: Self-pay | Admitting: Orthopedic Surgery

## 2024-07-26 DIAGNOSIS — Z89422 Acquired absence of other left toe(s): Secondary | ICD-10-CM | POA: Diagnosis not present

## 2024-07-26 DIAGNOSIS — I10 Essential (primary) hypertension: Secondary | ICD-10-CM | POA: Diagnosis not present

## 2024-07-26 DIAGNOSIS — E440 Moderate protein-calorie malnutrition: Secondary | ICD-10-CM | POA: Diagnosis not present

## 2024-07-26 DIAGNOSIS — M9701XD Periprosthetic fracture around internal prosthetic right hip joint, subsequent encounter: Secondary | ICD-10-CM | POA: Diagnosis not present

## 2024-07-26 DIAGNOSIS — Z9181 History of falling: Secondary | ICD-10-CM | POA: Diagnosis not present

## 2024-07-26 DIAGNOSIS — S72001D Fracture of unspecified part of neck of right femur, subsequent encounter for closed fracture with routine healing: Secondary | ICD-10-CM | POA: Diagnosis not present

## 2024-07-26 NOTE — Progress Notes (Signed)
 Orthopaedic Clinic Return  Assessment: Christy Fletcher is a 83 y.o. female with the following: Right hip periprosthetic femur fracture; greater trochanter  Plan: Repeat radiographs obtained in clinic today demonstrate stable alignment.  There has been no loosening.  Implant remains well-fixed.  Based on previous plans, we will advance her weightbearing.  She can now be weightbearing as tolerated.  Recommend the use of a walker or assistive device.  Continue to work with therapy.  Medication as needed.  If there are any further issues, they will contact the clinic.  Otherwise I will see her back in 4 weeks.  Follow-up: Return in about 4 weeks (around 08/22/2024).   Subjective:  Chief Complaint  Patient presents with   Fracture    R hip fx DOI 07/11/24    History of Present Illness: Christy Fletcher is a 83 y.o. female who returns to clinic for repeat evaluation of right hip pain.  She fell, approximately 2 weeks ago, and sustained a right hip periprosthetic femur fracture.  This primarily involves the greater trochanter.  CT scan at that time demonstrated that the implants remained well-fixed.  She has remained limited weightbearing.  She has been using a walker.  She is not complaining of pain.  She has been working with therapy at home.  Review of Systems:   Objective: There were no vitals taken for this visit.  Physical Exam:  Elderly female.  Seated in a wheelchair.  No tenderness over the lateral hip.  She is able to maintain a straight leg raise.  She tolerates gentle range of motion.  Sensation intact to the dorsum of the foot.  IMAGING: I personally ordered and reviewed the following images:  Right hip x-rays were obtained in clinic today.  These are compared to prior x-rays.  Right hip hemiarthroplasty remains in stable alignment.  Greater trochanter fracture with minimal displacement.  There has been no interval displacement.  The implants remain well-fixed.  There has  been no subsidence.  No lucency around the hardware.  Impression: Stable right hip periprosthetic femur fracture, without evidence of loosening  Oneil DELENA Horde, MD 07/26/2024 4:43 PM

## 2024-07-30 DIAGNOSIS — I10 Essential (primary) hypertension: Secondary | ICD-10-CM | POA: Diagnosis not present

## 2024-07-30 DIAGNOSIS — Z89422 Acquired absence of other left toe(s): Secondary | ICD-10-CM | POA: Diagnosis not present

## 2024-07-30 DIAGNOSIS — S72001D Fracture of unspecified part of neck of right femur, subsequent encounter for closed fracture with routine healing: Secondary | ICD-10-CM | POA: Diagnosis not present

## 2024-07-30 DIAGNOSIS — E440 Moderate protein-calorie malnutrition: Secondary | ICD-10-CM | POA: Diagnosis not present

## 2024-07-30 DIAGNOSIS — M9701XD Periprosthetic fracture around internal prosthetic right hip joint, subsequent encounter: Secondary | ICD-10-CM | POA: Diagnosis not present

## 2024-07-30 DIAGNOSIS — Z9181 History of falling: Secondary | ICD-10-CM | POA: Diagnosis not present

## 2024-08-01 DIAGNOSIS — Z9181 History of falling: Secondary | ICD-10-CM | POA: Diagnosis not present

## 2024-08-01 DIAGNOSIS — I10 Essential (primary) hypertension: Secondary | ICD-10-CM | POA: Diagnosis not present

## 2024-08-01 DIAGNOSIS — E440 Moderate protein-calorie malnutrition: Secondary | ICD-10-CM | POA: Diagnosis not present

## 2024-08-01 DIAGNOSIS — M9701XD Periprosthetic fracture around internal prosthetic right hip joint, subsequent encounter: Secondary | ICD-10-CM | POA: Diagnosis not present

## 2024-08-01 DIAGNOSIS — S72001D Fracture of unspecified part of neck of right femur, subsequent encounter for closed fracture with routine healing: Secondary | ICD-10-CM | POA: Diagnosis not present

## 2024-08-01 DIAGNOSIS — Z89422 Acquired absence of other left toe(s): Secondary | ICD-10-CM | POA: Diagnosis not present

## 2024-08-03 DIAGNOSIS — Z9181 History of falling: Secondary | ICD-10-CM | POA: Diagnosis not present

## 2024-08-03 DIAGNOSIS — M9701XD Periprosthetic fracture around internal prosthetic right hip joint, subsequent encounter: Secondary | ICD-10-CM | POA: Diagnosis not present

## 2024-08-03 DIAGNOSIS — S72001D Fracture of unspecified part of neck of right femur, subsequent encounter for closed fracture with routine healing: Secondary | ICD-10-CM | POA: Diagnosis not present

## 2024-08-03 DIAGNOSIS — Z89422 Acquired absence of other left toe(s): Secondary | ICD-10-CM | POA: Diagnosis not present

## 2024-08-03 DIAGNOSIS — E440 Moderate protein-calorie malnutrition: Secondary | ICD-10-CM | POA: Diagnosis not present

## 2024-08-03 DIAGNOSIS — I10 Essential (primary) hypertension: Secondary | ICD-10-CM | POA: Diagnosis not present

## 2024-08-07 DIAGNOSIS — Z9181 History of falling: Secondary | ICD-10-CM | POA: Diagnosis not present

## 2024-08-07 DIAGNOSIS — Z89422 Acquired absence of other left toe(s): Secondary | ICD-10-CM | POA: Diagnosis not present

## 2024-08-07 DIAGNOSIS — S72001D Fracture of unspecified part of neck of right femur, subsequent encounter for closed fracture with routine healing: Secondary | ICD-10-CM | POA: Diagnosis not present

## 2024-08-07 DIAGNOSIS — I10 Essential (primary) hypertension: Secondary | ICD-10-CM | POA: Diagnosis not present

## 2024-08-07 DIAGNOSIS — M9701XD Periprosthetic fracture around internal prosthetic right hip joint, subsequent encounter: Secondary | ICD-10-CM | POA: Diagnosis not present

## 2024-08-07 DIAGNOSIS — E440 Moderate protein-calorie malnutrition: Secondary | ICD-10-CM | POA: Diagnosis not present

## 2024-08-09 DIAGNOSIS — Z89422 Acquired absence of other left toe(s): Secondary | ICD-10-CM | POA: Diagnosis not present

## 2024-08-09 DIAGNOSIS — Z9181 History of falling: Secondary | ICD-10-CM | POA: Diagnosis not present

## 2024-08-09 DIAGNOSIS — S72001D Fracture of unspecified part of neck of right femur, subsequent encounter for closed fracture with routine healing: Secondary | ICD-10-CM | POA: Diagnosis not present

## 2024-08-09 DIAGNOSIS — E440 Moderate protein-calorie malnutrition: Secondary | ICD-10-CM | POA: Diagnosis not present

## 2024-08-09 DIAGNOSIS — M9701XD Periprosthetic fracture around internal prosthetic right hip joint, subsequent encounter: Secondary | ICD-10-CM | POA: Diagnosis not present

## 2024-08-09 DIAGNOSIS — I10 Essential (primary) hypertension: Secondary | ICD-10-CM | POA: Diagnosis not present

## 2024-08-13 DIAGNOSIS — S72001D Fracture of unspecified part of neck of right femur, subsequent encounter for closed fracture with routine healing: Secondary | ICD-10-CM | POA: Diagnosis not present

## 2024-08-13 DIAGNOSIS — Z89422 Acquired absence of other left toe(s): Secondary | ICD-10-CM | POA: Diagnosis not present

## 2024-08-13 DIAGNOSIS — E440 Moderate protein-calorie malnutrition: Secondary | ICD-10-CM | POA: Diagnosis not present

## 2024-08-13 DIAGNOSIS — M9701XD Periprosthetic fracture around internal prosthetic right hip joint, subsequent encounter: Secondary | ICD-10-CM | POA: Diagnosis not present

## 2024-08-13 DIAGNOSIS — Z9181 History of falling: Secondary | ICD-10-CM | POA: Diagnosis not present

## 2024-08-13 DIAGNOSIS — I10 Essential (primary) hypertension: Secondary | ICD-10-CM | POA: Diagnosis not present

## 2024-08-14 ENCOUNTER — Ambulatory Visit (INDEPENDENT_AMBULATORY_CARE_PROVIDER_SITE_OTHER)

## 2024-08-14 VITALS — BP 131/82 | HR 74 | Resp 16

## 2024-08-14 DIAGNOSIS — D649 Anemia, unspecified: Secondary | ICD-10-CM

## 2024-08-14 DIAGNOSIS — S72001D Fracture of unspecified part of neck of right femur, subsequent encounter for closed fracture with routine healing: Secondary | ICD-10-CM | POA: Diagnosis not present

## 2024-08-14 DIAGNOSIS — Z89422 Acquired absence of other left toe(s): Secondary | ICD-10-CM | POA: Diagnosis not present

## 2024-08-14 DIAGNOSIS — Z95811 Presence of heart assist device: Secondary | ICD-10-CM | POA: Insufficient documentation

## 2024-08-14 DIAGNOSIS — E440 Moderate protein-calorie malnutrition: Secondary | ICD-10-CM | POA: Diagnosis not present

## 2024-08-14 DIAGNOSIS — M9701XD Periprosthetic fracture around internal prosthetic right hip joint, subsequent encounter: Secondary | ICD-10-CM | POA: Diagnosis not present

## 2024-08-14 DIAGNOSIS — Z9181 History of falling: Secondary | ICD-10-CM | POA: Diagnosis not present

## 2024-08-14 DIAGNOSIS — I10 Essential (primary) hypertension: Secondary | ICD-10-CM | POA: Diagnosis not present

## 2024-08-14 NOTE — Progress Notes (Signed)
 Established Patient Office Visit  Subjective   Patient ID: Christy Fletcher, female    DOB: 1940/10/07  Age: 83 y.o. MRN: [id number]  Chief Complaint  Patient presents with   Medical Management of Chronic Issues    6 month follow up    HPI Discussed the use of AI scribe software for clinical note transcription with the patient, who gave verbal consent to proceed.  History of Present Illness    Christy Fletcher is an 83 year old female who presents for follow-up after hip surgery.  Hip fracture and postoperative recovery - Recent fall at the edge of her carport resulting in a new hip fracture and subsequent surgery - Unable to bear full weight on the affected hip - Uses a walker for mobility - Undergoing physical and occupational therapy at home - History of hip fracture in 2019 after a fall at home, with fracture in two places requiring surgery  Knee pain - Knee pain, particularly with ambulation - No recollection of specific knee injury during recent fall  Nutritional and laboratory abnormalities - Taking calcium  and vitamin D  supplements for previously low vitamin D  levels - Recent hospitalization revealed mild anemia and low calcium  levels  Cerebrovascular disease - History of mild stroke at the time of hip fracture in 2019  Cardiac monitoring - Implantable cardiac monitoring device in place - No device activations or issues since placement  Hyperlipidemia - Takes lovastatin  for mildly elevated cholesterol - Cholesterol last checked in May     Patient Active Problem List   Diagnosis Date Noted   Anemia 08/19/2024   Hypocalcemia 08/19/2024   Malnutrition of moderate degree 07/13/2024   Periprosthetic hip fracture, initial encounter 07/12/2024   History of stroke 07/12/2024   Chronic rhinitis 03/28/2024   Sensorineural hearing loss, bilateral 03/28/2024   Impacted cerumen of both ears 03/28/2024   Bunion of great toe of left foot 08/05/2023   Osteomyelitis of  second toe of left foot (HCC) 04/01/2023   Cerebral embolism with cerebral infarction 11/23/2017   Osteopenia 11/19/2017   Arthritis 11/19/2017   Dyslipidemia 11/19/2017   Hip fracture (HCC) 11/19/2017   Decreased range of motion of right elbow 11/19/2013   Muscle weakness (generalized) 11/19/2013   Edema 11/19/2013   Pain in joint, forearm 11/19/2013    ROS    Objective:     BP 131/82   Pulse 74   Resp 16   SpO2 96%  BP Readings from Last 3 Encounters:  08/14/24 131/82  07/13/24 (!) 106/55  03/27/24 135/76   Wt Readings from Last 3 Encounters:  07/11/24 121 lb 14.6 oz (55.3 kg)  03/27/24 122 lb (55.3 kg)  02/10/24 122 lb (55.3 kg)     Physical Exam Vitals and nursing note reviewed.  Constitutional:      Appearance: Normal appearance.  HENT:     Head: Normocephalic.  Eyes:     Extraocular Movements: Extraocular movements intact.     Pupils: Pupils are equal, round, and reactive to light.  Cardiovascular:     Rate and Rhythm: Normal rate and regular rhythm.  Pulmonary:     Effort: Pulmonary effort is normal.     Breath sounds: Normal breath sounds.  Musculoskeletal:     Cervical back: Normal range of motion and neck supple.  Neurological:     Mental Status: She is alert and oriented to person, place, and time.  Psychiatric:        Mood and Affect: Mood normal.  Thought Content: Thought content normal.          The ASCVD Risk score (Arnett DK, et al., 2019) failed to calculate for the following reasons:   The 2019 ASCVD risk score is only valid for ages 20 to 45   Risk score cannot be calculated because patient has a medical history suggesting prior/existing ASCVD    Assessment & Plan:   Problem List Items Addressed This Visit       Musculoskeletal and Integument   Hip fracture (HCC)     Status post right hip fracture with surgical repair Recent surgical repair with limited weight-bearing status. Swelling decreased. Undergoing home  therapy. - Continue home physical and occupational therapy. - Follow up with orthopedic surgeon next week.        Other   Anemia - Primary   Slight anemia noted during hospitalization. - Ordered CBC to reassess anemia.      Relevant Orders   CBC (Completed)   Hypocalcemia   Low calcium  levels during hospitalization. On calcium  supplements. - Ordered calcium  level to reassess. - Continue calcium  supplementation.      Relevant Orders   Basic Metabolic Panel (BMET) (Completed)   No follow-ups on file.    Leita Longs, FNP

## 2024-08-15 LAB — CBC
Hematocrit: 39.6 % (ref 34.0–46.6)
Hemoglobin: 12.6 g/dL (ref 11.1–15.9)
MCH: 29.6 pg (ref 26.6–33.0)
MCHC: 31.8 g/dL (ref 31.5–35.7)
MCV: 93 fL (ref 79–97)
Platelets: 255 x10E3/uL (ref 150–450)
RBC: 4.26 x10E6/uL (ref 3.77–5.28)
RDW: 12.7 % (ref 11.7–15.4)
WBC: 8.5 x10E3/uL (ref 3.4–10.8)

## 2024-08-15 LAB — BASIC METABOLIC PANEL WITH GFR
BUN/Creatinine Ratio: 15 (ref 12–28)
BUN: 11 mg/dL (ref 8–27)
CO2: 24 mmol/L (ref 20–29)
Calcium: 9.7 mg/dL (ref 8.7–10.3)
Chloride: 100 mmol/L (ref 96–106)
Creatinine, Ser: 0.71 mg/dL (ref 0.57–1.00)
Glucose: 107 mg/dL — ABNORMAL HIGH (ref 70–99)
Potassium: 4 mmol/L (ref 3.5–5.2)
Sodium: 140 mmol/L (ref 134–144)
eGFR: 85 mL/min/1.73 (ref >59)

## 2024-08-19 ENCOUNTER — Ambulatory Visit: Payer: Self-pay

## 2024-08-19 DIAGNOSIS — D649 Anemia, unspecified: Secondary | ICD-10-CM | POA: Insufficient documentation

## 2024-08-19 NOTE — Assessment & Plan Note (Signed)
 Slight anemia noted during hospitalization. - Ordered CBC to reassess anemia.

## 2024-08-19 NOTE — Assessment & Plan Note (Signed)
 Status post right hip fracture with surgical repair Recent surgical repair with limited weight-bearing status. Swelling decreased. Undergoing home therapy. - Continue home physical and occupational therapy. - Follow up with orthopedic surgeon next week.

## 2024-08-19 NOTE — Assessment & Plan Note (Signed)
 Low calcium  levels during hospitalization. On calcium  supplements. - Ordered calcium  level to reassess. - Continue calcium  supplementation.

## 2024-08-20 DIAGNOSIS — C44311 Basal cell carcinoma of skin of nose: Secondary | ICD-10-CM | POA: Diagnosis not present

## 2024-08-22 ENCOUNTER — Encounter: Payer: Self-pay | Admitting: Orthopedic Surgery

## 2024-08-22 ENCOUNTER — Telehealth: Payer: Self-pay | Admitting: Orthopedic Surgery

## 2024-08-22 ENCOUNTER — Ambulatory Visit: Admitting: Orthopedic Surgery

## 2024-08-22 ENCOUNTER — Other Ambulatory Visit: Payer: Self-pay

## 2024-08-22 DIAGNOSIS — M978XXD Periprosthetic fracture around other internal prosthetic joint, subsequent encounter: Secondary | ICD-10-CM | POA: Diagnosis not present

## 2024-08-22 DIAGNOSIS — Z96649 Presence of unspecified artificial hip joint: Secondary | ICD-10-CM | POA: Diagnosis not present

## 2024-08-22 DIAGNOSIS — M978XXA Periprosthetic fracture around other internal prosthetic joint, initial encounter: Secondary | ICD-10-CM | POA: Diagnosis not present

## 2024-08-22 NOTE — Progress Notes (Signed)
 Orthopaedic Clinic Return  Assessment: ROSALEA WITHROW is a 83 y.o. female with the following: Right hip periprosthetic femur fracture; greater trochanter  Plan: Mrs. Vanderlinde is doing better.  Pain is improved.  She continues to ambulate with the assistance of a walker, 50% weightbearing.  Radiographs remain stable.  There is some remodeling around the greater trochanteric fracture site.  However, there is no obvious loosening of the stem.  No concerns about loosening based on her description of her symptoms.  Anticipate gradual improvement in the pain, as well as the strength in her right leg.  Have recommended protected weightbearing as tolerated for at least an additional 2 weeks and then she can transition off of the walker.  Continue with home health physical therapy.  Follow-up in 6 weeks.  Follow-up: Return in about 6 weeks (around 10/03/2024).   Subjective:  Chief Complaint  Patient presents with   Fracture    R hip fx DOI 07/11/24      History of Present Illness: LENDY DITTRICH is a 83 y.o. female who returns to clinic for repeat evaluation of right hip pain.  She fell, approximately 6 weeks ago, and sustained a right hip periprosthetic femur fracture.  She continues to work with home health physical therapy.  She reports that she is feeling better.  She has been limiting her weightbearing to approximately 50%, with the use of a walker.  Occasional pain in the right leg, but primarily in the distal thigh and knee area.  No new injuries.  She did not fall.  Rarely she taking anything for pain but will consider Tylenol  occasionally.  Review of Systems:   Objective: There were no vitals taken for this visit.  Physical Exam:  Elderly female.  Seated in a wheelchair.  Gentle range of motion in the right hip.  She is able to maintain a straight leg raise.  No pain with axial loading.  No tenderness over the lateral hip.  IMAGING: I personally ordered and reviewed the  following images:  AP pelvis and right hip x-rays were obtained in clinic today.  These are compared to available x-rays.  Right hip hemiarthroplasty remains in stable alignment.  Greater trochanter fracture has not displaced.  There is some remodeling around the fracture site, which is still visible.  Hip is located.  No additional lucency around the fracture.  No additional injuries.  No obvious loosening.  Impression: Stable right periprosthetic hip fracture involving greater trochanter  Oneil DELENA Horde, MD 08/22/2024 3:23 PM

## 2024-08-22 NOTE — Telephone Encounter (Signed)
 Dr. Onesimo pt GLENWOOD Browning OT w/[facility] [phone] lvm stating that they would like verbal orders to extend the pt's PT starting 08/27/24.2w1and 1w2, she has made progress, she thinks by this time the pt will have achieved her goal.

## 2024-08-22 NOTE — Telephone Encounter (Signed)
Verbal order given to Michelle.  

## 2024-08-29 ENCOUNTER — Encounter: Admitting: Orthopedic Surgery

## 2024-09-02 ENCOUNTER — Telehealth: Payer: Self-pay | Admitting: Orthopedic Surgery

## 2024-09-02 NOTE — Telephone Encounter (Signed)
 Dr. Onesimo pt - Marko Peng PT w/[facility] [phone] lvm on 08/31/24 11:20am, she is requesting to extend [facility] PT two times a week for two more weeks.

## 2024-09-04 NOTE — Telephone Encounter (Signed)
Left verbal order on VM 

## 2024-09-11 ENCOUNTER — Telehealth: Payer: Self-pay

## 2024-09-11 NOTE — Telephone Encounter (Signed)
 Monique (therapist) with [facility]. She wants verbal for recertification for 1 time for the next weeks  She can be reach [phone] Please call and advise.

## 2024-09-12 NOTE — Telephone Encounter (Signed)
 Verbal order given

## 2024-10-03 ENCOUNTER — Encounter: Payer: Self-pay | Admitting: Orthopedic Surgery

## 2024-10-03 ENCOUNTER — Other Ambulatory Visit: Payer: Self-pay

## 2024-10-03 ENCOUNTER — Ambulatory Visit: Admitting: Orthopedic Surgery

## 2024-10-03 DIAGNOSIS — M978XXD Periprosthetic fracture around other internal prosthetic joint, subsequent encounter: Secondary | ICD-10-CM | POA: Diagnosis not present

## 2024-10-03 DIAGNOSIS — Z96649 Presence of unspecified artificial hip joint: Secondary | ICD-10-CM

## 2024-10-03 NOTE — Progress Notes (Signed)
 Orthopaedic Clinic Return  Assessment: Christy Fletcher is a 84 y.o. female with the following: Right hip periprosthetic femur fracture; greater trochanter  Plan: Christy Fletcher is doing well.  She continues to transition off of the walker.  She is working with home health therapy.  No pain over the lateral hip.  Mild discomfort in the right knee, which is likely secondary to chronic degenerative changes.  Urged her to continue working with therapy to get stronger.  Can transition off the walker as tolerated.  Like see her back in 2 months for repeat patient.  Follow-up: Return in about 2 months (around 12/01/2024).   Subjective:  Chief Complaint  Patient presents with   Hip Injury    Right hip fracture / improving     History of Present Illness: Christy Fletcher is a 84 y.o. female who returns to clinic for repeat evaluation of right hip pain.  She fell, almost 3 months ago, and sustained a right hip periprosthetic femur fracture.  Her pain is better.  She notes her function is improving.  She has started to ambulate without the assistance of a walker.  Occasionally, she is using a cane.  She notes some weakness in the right leg.  She has pain in the right knee, and is wearing a brace.  Review of Systems:   Objective: There were no vitals taken for this visit.  Physical Exam:  Elderly female.  Seated in a wheelchair.  Gentle range of motion in the right hip.  She is able to maintain a straight leg raise.  No pain with axial loading.  No tenderness over the lateral hip.  She notes pain in the right knee.  She is wearing a brace.  IMAGING: I personally ordered and reviewed the following images:  AP pelvis right hip x-rays were obtained in clinic today.  These are compared to prior x-rays.  Right hip hemiarthroplasty remains in unchanged alignment.  There has been interval consolidation.  There is no additional lucency around the prosthesis.  No change in overall alignment.  No new  fractures.  No bony lesions.  Impression: Healed right hip periprosthetic femur fracture  Oneil Christy Horde, MD 10/04/2024 9:49 AM

## 2024-10-11 ENCOUNTER — Telehealth: Payer: Self-pay | Admitting: Orthopedic Surgery

## 2024-10-11 NOTE — Telephone Encounter (Signed)
 Dr. Onesimo pt - spoke w/Amy w/[facility] [phone], she is requesting to extend for 1w4 to progress the pt's gait w/a cane.

## 2024-10-11 NOTE — Telephone Encounter (Signed)
Verbal order given to Amy 

## 2024-12-04 ENCOUNTER — Encounter: Admitting: Orthopedic Surgery

## 2025-02-12 ENCOUNTER — Ambulatory Visit
